# Patient Record
Sex: Male | Born: 1955
Health system: Southern US, Community
[De-identification: ages and names within clinical notes are randomized; demographics above are authoritative.]

## PROBLEM LIST (undated history)

## (undated) DIAGNOSIS — K529 Noninfective gastroenteritis and colitis, unspecified: Secondary | ICD-10-CM

## (undated) DIAGNOSIS — M4850XA Collapsed vertebra, not elsewhere classified, site unspecified, initial encounter for fracture: Secondary | ICD-10-CM

## (undated) DIAGNOSIS — I639 Cerebral infarction, unspecified: Secondary | ICD-10-CM

## (undated) DIAGNOSIS — M1990 Unspecified osteoarthritis, unspecified site: Secondary | ICD-10-CM

## (undated) DIAGNOSIS — R011 Cardiac murmur, unspecified: Secondary | ICD-10-CM

## (undated) DIAGNOSIS — Z9289 Personal history of other medical treatment: Secondary | ICD-10-CM

## (undated) DIAGNOSIS — M51369 Other intervertebral disc degeneration, lumbar region without mention of lumbar back pain or lower extremity pain: Secondary | ICD-10-CM

## (undated) DIAGNOSIS — K4090 Unilateral inguinal hernia, without obstruction or gangrene, not specified as recurrent: Secondary | ICD-10-CM

## (undated) DIAGNOSIS — I10 Essential (primary) hypertension: Secondary | ICD-10-CM

## (undated) DIAGNOSIS — F1010 Alcohol abuse, uncomplicated: Secondary | ICD-10-CM

## (undated) DIAGNOSIS — J449 Chronic obstructive pulmonary disease, unspecified: Secondary | ICD-10-CM

## (undated) DIAGNOSIS — M5136 Other intervertebral disc degeneration, lumbar region: Secondary | ICD-10-CM

## (undated) DIAGNOSIS — K219 Gastro-esophageal reflux disease without esophagitis: Secondary | ICD-10-CM

## (undated) DIAGNOSIS — Z720 Tobacco use: Secondary | ICD-10-CM

## (undated) HISTORY — PX: COLON SURGERY: SHX602

---

## 1979-03-09 HISTORY — PX: EXPLORATORY LAPAROTOMY W/ BOWEL RESECTION: SHX1544

## 1999-06-23 ENCOUNTER — Emergency Department (HOSPITAL_COMMUNITY): Admission: EM | Admit: 1999-06-23 | Discharge: 1999-06-23 | Payer: Self-pay | Admitting: Emergency Medicine

## 1999-06-23 ENCOUNTER — Encounter: Payer: Self-pay | Admitting: *Deleted

## 1999-07-09 DIAGNOSIS — Z9289 Personal history of other medical treatment: Secondary | ICD-10-CM

## 1999-07-09 HISTORY — DX: Personal history of other medical treatment: Z92.89

## 1999-11-29 ENCOUNTER — Encounter: Admission: RE | Admit: 1999-11-29 | Discharge: 1999-11-30 | Payer: Self-pay | Admitting: *Deleted

## 2000-02-13 ENCOUNTER — Inpatient Hospital Stay (HOSPITAL_COMMUNITY): Admission: EM | Admit: 2000-02-13 | Discharge: 2000-02-14 | Payer: Self-pay | Admitting: *Deleted

## 2000-02-13 ENCOUNTER — Encounter: Payer: Self-pay | Admitting: *Deleted

## 2000-03-10 ENCOUNTER — Emergency Department (HOSPITAL_COMMUNITY): Admission: EM | Admit: 2000-03-10 | Discharge: 2000-03-10 | Payer: Self-pay | Admitting: Emergency Medicine

## 2002-01-27 ENCOUNTER — Encounter: Admission: RE | Admit: 2002-01-27 | Discharge: 2002-01-27 | Payer: Self-pay | Admitting: Occupational Medicine

## 2002-01-27 ENCOUNTER — Encounter: Payer: Self-pay | Admitting: Occupational Medicine

## 2002-02-03 ENCOUNTER — Emergency Department (HOSPITAL_COMMUNITY): Admission: EM | Admit: 2002-02-03 | Discharge: 2002-02-03 | Payer: Self-pay | Admitting: Emergency Medicine

## 2002-02-03 ENCOUNTER — Encounter: Payer: Self-pay | Admitting: Emergency Medicine

## 2002-02-04 ENCOUNTER — Encounter: Payer: Self-pay | Admitting: Emergency Medicine

## 2002-02-14 ENCOUNTER — Encounter: Payer: Self-pay | Admitting: Emergency Medicine

## 2002-02-14 ENCOUNTER — Emergency Department (HOSPITAL_COMMUNITY): Admission: EM | Admit: 2002-02-14 | Discharge: 2002-02-14 | Payer: Self-pay | Admitting: Emergency Medicine

## 2004-03-01 ENCOUNTER — Emergency Department (HOSPITAL_COMMUNITY): Admission: EM | Admit: 2004-03-01 | Discharge: 2004-03-01 | Payer: Self-pay | Admitting: Emergency Medicine

## 2004-05-20 ENCOUNTER — Emergency Department: Payer: Self-pay | Admitting: Emergency Medicine

## 2005-02-25 ENCOUNTER — Emergency Department (HOSPITAL_COMMUNITY): Admission: EM | Admit: 2005-02-25 | Discharge: 2005-02-25 | Payer: Self-pay | Admitting: Emergency Medicine

## 2005-03-06 ENCOUNTER — Emergency Department (HOSPITAL_COMMUNITY): Admission: EM | Admit: 2005-03-06 | Discharge: 2005-03-06 | Payer: Self-pay | Admitting: Emergency Medicine

## 2005-03-10 ENCOUNTER — Emergency Department: Payer: Self-pay | Admitting: Internal Medicine

## 2005-04-02 ENCOUNTER — Emergency Department: Payer: Self-pay | Admitting: Emergency Medicine

## 2005-08-04 ENCOUNTER — Emergency Department (HOSPITAL_COMMUNITY): Admission: EM | Admit: 2005-08-04 | Discharge: 2005-08-04 | Payer: Self-pay | Admitting: Emergency Medicine

## 2005-09-12 ENCOUNTER — Emergency Department (HOSPITAL_COMMUNITY): Admission: EM | Admit: 2005-09-12 | Discharge: 2005-09-12 | Payer: Self-pay | Admitting: Emergency Medicine

## 2005-10-16 ENCOUNTER — Inpatient Hospital Stay (HOSPITAL_COMMUNITY): Admission: EM | Admit: 2005-10-16 | Discharge: 2005-10-17 | Payer: Self-pay | Admitting: Emergency Medicine

## 2005-10-17 ENCOUNTER — Encounter (INDEPENDENT_AMBULATORY_CARE_PROVIDER_SITE_OTHER): Payer: Self-pay | Admitting: Specialist

## 2006-03-03 ENCOUNTER — Emergency Department: Payer: Self-pay | Admitting: Unknown Physician Specialty

## 2006-06-07 ENCOUNTER — Emergency Department (HOSPITAL_COMMUNITY): Admission: EM | Admit: 2006-06-07 | Discharge: 2006-06-07 | Payer: Self-pay | Admitting: Emergency Medicine

## 2006-06-28 ENCOUNTER — Emergency Department (HOSPITAL_COMMUNITY): Admission: EM | Admit: 2006-06-28 | Discharge: 2006-06-28 | Payer: Self-pay | Admitting: Emergency Medicine

## 2006-07-26 ENCOUNTER — Emergency Department (HOSPITAL_COMMUNITY): Admission: EM | Admit: 2006-07-26 | Discharge: 2006-07-26 | Payer: Self-pay | Admitting: Emergency Medicine

## 2006-10-16 ENCOUNTER — Emergency Department (HOSPITAL_COMMUNITY): Admission: EM | Admit: 2006-10-16 | Discharge: 2006-10-16 | Payer: Self-pay | Admitting: Emergency Medicine

## 2006-11-13 ENCOUNTER — Emergency Department (HOSPITAL_COMMUNITY): Admission: EM | Admit: 2006-11-13 | Discharge: 2006-11-13 | Payer: Self-pay | Admitting: Emergency Medicine

## 2007-01-07 ENCOUNTER — Encounter: Admission: RE | Admit: 2007-01-07 | Discharge: 2007-01-07 | Payer: Self-pay | Admitting: Internal Medicine

## 2007-01-12 ENCOUNTER — Encounter: Admission: RE | Admit: 2007-01-12 | Discharge: 2007-04-12 | Payer: Self-pay | Admitting: Internal Medicine

## 2007-01-30 ENCOUNTER — Encounter: Admission: RE | Admit: 2007-01-30 | Discharge: 2007-01-30 | Payer: Self-pay | Admitting: Internal Medicine

## 2007-05-27 ENCOUNTER — Emergency Department (HOSPITAL_COMMUNITY): Admission: EM | Admit: 2007-05-27 | Discharge: 2007-05-27 | Payer: Self-pay | Admitting: Emergency Medicine

## 2007-07-17 ENCOUNTER — Emergency Department (HOSPITAL_COMMUNITY): Admission: EM | Admit: 2007-07-17 | Discharge: 2007-07-17 | Payer: Self-pay | Admitting: Emergency Medicine

## 2007-08-27 ENCOUNTER — Emergency Department: Payer: Self-pay | Admitting: Emergency Medicine

## 2007-08-27 ENCOUNTER — Other Ambulatory Visit: Payer: Self-pay

## 2008-06-21 ENCOUNTER — Emergency Department (HOSPITAL_COMMUNITY): Admission: EM | Admit: 2008-06-21 | Discharge: 2008-06-21 | Payer: Self-pay | Admitting: Emergency Medicine

## 2008-12-07 ENCOUNTER — Emergency Department (HOSPITAL_COMMUNITY): Admission: EM | Admit: 2008-12-07 | Discharge: 2008-12-07 | Payer: Self-pay | Admitting: Emergency Medicine

## 2009-01-16 ENCOUNTER — Emergency Department (HOSPITAL_COMMUNITY): Admission: EM | Admit: 2009-01-16 | Discharge: 2009-01-16 | Payer: Self-pay | Admitting: Emergency Medicine

## 2009-03-19 ENCOUNTER — Emergency Department (HOSPITAL_COMMUNITY): Admission: EM | Admit: 2009-03-19 | Discharge: 2009-03-19 | Payer: Self-pay | Admitting: Emergency Medicine

## 2009-04-15 ENCOUNTER — Emergency Department (HOSPITAL_COMMUNITY): Admission: EM | Admit: 2009-04-15 | Discharge: 2009-04-15 | Payer: Self-pay | Admitting: Emergency Medicine

## 2009-04-16 ENCOUNTER — Emergency Department (HOSPITAL_COMMUNITY): Admission: EM | Admit: 2009-04-16 | Discharge: 2009-04-17 | Payer: Self-pay | Admitting: Emergency Medicine

## 2009-06-03 ENCOUNTER — Emergency Department (HOSPITAL_COMMUNITY): Admission: EM | Admit: 2009-06-03 | Discharge: 2009-06-03 | Payer: Self-pay | Admitting: Emergency Medicine

## 2009-07-13 ENCOUNTER — Emergency Department (HOSPITAL_COMMUNITY): Admission: EM | Admit: 2009-07-13 | Discharge: 2009-07-14 | Payer: Self-pay | Admitting: Emergency Medicine

## 2009-09-27 ENCOUNTER — Emergency Department (HOSPITAL_COMMUNITY): Admission: EM | Admit: 2009-09-27 | Discharge: 2009-09-27 | Payer: Self-pay | Admitting: Emergency Medicine

## 2009-10-18 ENCOUNTER — Emergency Department (HOSPITAL_COMMUNITY): Admission: EM | Admit: 2009-10-18 | Discharge: 2009-10-18 | Payer: Self-pay | Admitting: Emergency Medicine

## 2010-02-27 ENCOUNTER — Emergency Department (HOSPITAL_COMMUNITY): Admission: EM | Admit: 2010-02-27 | Discharge: 2010-02-27 | Payer: Self-pay | Admitting: Emergency Medicine

## 2010-04-06 ENCOUNTER — Emergency Department (HOSPITAL_COMMUNITY): Admission: EM | Admit: 2010-04-06 | Discharge: 2010-04-06 | Payer: Self-pay | Admitting: Emergency Medicine

## 2010-05-18 ENCOUNTER — Emergency Department (HOSPITAL_COMMUNITY): Admission: EM | Admit: 2010-05-18 | Discharge: 2010-05-18 | Payer: Self-pay | Admitting: Emergency Medicine

## 2010-07-24 ENCOUNTER — Emergency Department (HOSPITAL_COMMUNITY)
Admission: EM | Admit: 2010-07-24 | Discharge: 2010-07-24 | Payer: Self-pay | Source: Home / Self Care | Admitting: Emergency Medicine

## 2010-09-26 LAB — BASIC METABOLIC PANEL
BUN: 7 mg/dL (ref 6–23)
CO2: 26 mEq/L (ref 19–32)
Calcium: 8.5 mg/dL (ref 8.4–10.5)
Chloride: 106 mEq/L (ref 96–112)
Creatinine, Ser: 1.01 mg/dL (ref 0.4–1.5)

## 2010-09-26 LAB — CBC
Hemoglobin: 16 g/dL (ref 13.0–17.0)
MCHC: 34.2 g/dL (ref 30.0–36.0)
RDW: 16.6 % — ABNORMAL HIGH (ref 11.5–15.5)
WBC: 4.7 10*3/uL (ref 4.0–10.5)

## 2010-09-26 LAB — DIFFERENTIAL
Basophils Absolute: 0 10*3/uL (ref 0.0–0.1)
Basophils Relative: 1 % (ref 0–1)
Eosinophils Absolute: 0.1 10*3/uL (ref 0.0–0.7)
Lymphocytes Relative: 39 % (ref 12–46)
Neutro Abs: 2.3 10*3/uL (ref 1.7–7.7)

## 2010-09-30 LAB — COMPREHENSIVE METABOLIC PANEL
ALT: 57 U/L — ABNORMAL HIGH (ref 0–53)
Alkaline Phosphatase: 50 U/L (ref 39–117)
CO2: 25 mEq/L (ref 19–32)
Chloride: 104 mEq/L (ref 96–112)
Glucose, Bld: 91 mg/dL (ref 70–99)
Potassium: 3.8 mEq/L (ref 3.5–5.1)
Sodium: 136 mEq/L (ref 135–145)
Total Bilirubin: 0.5 mg/dL (ref 0.3–1.2)
Total Protein: 7.2 g/dL (ref 6.0–8.3)

## 2010-09-30 LAB — CBC
HCT: 46.7 % (ref 39.0–52.0)
Hemoglobin: 15.3 g/dL (ref 13.0–17.0)
RBC: 5.35 MIL/uL (ref 4.22–5.81)
RDW: 16.3 % — ABNORMAL HIGH (ref 11.5–15.5)
WBC: 5 10*3/uL (ref 4.0–10.5)

## 2010-09-30 LAB — DIFFERENTIAL
Basophils Absolute: 0 10*3/uL (ref 0.0–0.1)
Basophils Relative: 1 % (ref 0–1)
Eosinophils Absolute: 0.4 10*3/uL (ref 0.0–0.7)
Monocytes Relative: 8 % (ref 3–12)
Neutrophils Relative %: 49 % (ref 43–77)

## 2010-09-30 LAB — URINALYSIS, ROUTINE W REFLEX MICROSCOPIC
Bilirubin Urine: NEGATIVE
Hgb urine dipstick: NEGATIVE
Nitrite: NEGATIVE
Protein, ur: NEGATIVE mg/dL

## 2010-09-30 LAB — URINE CULTURE

## 2010-09-30 LAB — URINE MICROSCOPIC-ADD ON

## 2010-10-01 ENCOUNTER — Emergency Department (HOSPITAL_COMMUNITY): Payer: Self-pay

## 2010-10-01 ENCOUNTER — Emergency Department (HOSPITAL_COMMUNITY)
Admission: EM | Admit: 2010-10-01 | Discharge: 2010-10-01 | Disposition: A | Discharge status: Home / Self Care | Payer: Self-pay | Attending: Emergency Medicine | Admitting: Emergency Medicine

## 2010-10-01 DIAGNOSIS — R109 Unspecified abdominal pain: Secondary | ICD-10-CM | POA: Insufficient documentation

## 2010-10-01 DIAGNOSIS — Z8719 Personal history of other diseases of the digestive system: Secondary | ICD-10-CM | POA: Insufficient documentation

## 2010-10-01 LAB — COMPREHENSIVE METABOLIC PANEL
ALT: 48 U/L (ref 0–53)
AST: 56 U/L — ABNORMAL HIGH (ref 0–37)
Alkaline Phosphatase: 54 U/L (ref 39–117)
CO2: 23 mEq/L (ref 19–32)
Chloride: 105 mEq/L (ref 96–112)
GFR calc Af Amer: >60 mL/min (ref >60)
GFR calc non Af Amer: >60 mL/min (ref >60)
Sodium: 136 mEq/L (ref 135–145)
Total Bilirubin: 1.4 mg/dL — ABNORMAL HIGH (ref 0.3–1.2)

## 2010-10-01 LAB — DIFFERENTIAL
Eosinophils Relative: 41 % — ABNORMAL HIGH (ref 0–5)
Lymphs Abs: 1.8 10*3/uL (ref 0.7–4.0)
Monocytes Relative: 6 % (ref 3–12)
Neutrophils Relative %: 38 % — ABNORMAL LOW (ref 43–77)

## 2010-10-01 LAB — URINALYSIS, ROUTINE W REFLEX MICROSCOPIC
Bilirubin Urine: NEGATIVE
Hgb urine dipstick: NEGATIVE
Protein, ur: NEGATIVE mg/dL
Specific Gravity, Urine: 1.03 (ref 1.005–1.030)
Urobilinogen, UA: 0.2 mg/dL (ref 0.0–1.0)

## 2010-10-01 LAB — CBC
Hemoglobin: 16 g/dL (ref 13.0–17.0)
RBC: 5.55 MIL/uL (ref 4.22–5.81)

## 2010-10-01 LAB — POTASSIUM: Potassium: 4.3 mEq/L (ref 3.5–5.1)

## 2010-10-10 LAB — CBC
HCT: 47.9 % (ref 39.0–52.0)
Hemoglobin: 16.2 g/dL (ref 13.0–17.0)
RDW: 16.1 % — ABNORMAL HIGH (ref 11.5–15.5)

## 2010-10-10 LAB — POCT I-STAT, CHEM 8
BUN: 11 mg/dL (ref 6–23)
Chloride: 105 mEq/L (ref 96–112)
Creatinine, Ser: 1.2 mg/dL (ref 0.4–1.5)
Glucose, Bld: 117 mg/dL — ABNORMAL HIGH (ref 70–99)
Hemoglobin: 18 g/dL — ABNORMAL HIGH (ref 13.0–17.0)
Potassium: 3.9 mEq/L (ref 3.5–5.1)

## 2010-10-10 LAB — DIFFERENTIAL
Basophils Relative: 0 % (ref 0–1)
Eosinophils Relative: 2 % (ref 0–5)
Monocytes Absolute: 1.1 10*3/uL — ABNORMAL HIGH (ref 0.1–1.0)
Neutrophils Relative %: 59 % (ref 43–77)

## 2010-10-11 LAB — DIFFERENTIAL
Basophils Absolute: 0.1 10*3/uL (ref 0.0–0.1)
Basophils Absolute: 0.1 10*3/uL (ref 0.0–0.1)
Eosinophils Absolute: 1.7 10*3/uL — ABNORMAL HIGH (ref 0.0–0.7)
Eosinophils Relative: 1 % (ref 0–5)
Eosinophils Relative: 22 % — ABNORMAL HIGH (ref 0–5)
Lymphocytes Relative: 13 % (ref 12–46)
Monocytes Absolute: 0.1 10*3/uL (ref 0.1–1.0)
Monocytes Absolute: 0.3 10*3/uL (ref 0.1–1.0)
Monocytes Relative: 1 % — ABNORMAL LOW (ref 3–12)
Neutro Abs: 6 10*3/uL (ref 1.7–7.7)

## 2010-10-11 LAB — BASIC METABOLIC PANEL
BUN: 11 mg/dL (ref 6–23)
CO2: 22 mEq/L (ref 19–32)
Chloride: 108 mEq/L (ref 96–112)
Glucose, Bld: 77 mg/dL (ref 70–99)
Potassium: 3.5 mEq/L (ref 3.5–5.1)

## 2010-10-11 LAB — COMPREHENSIVE METABOLIC PANEL
AST: 31 U/L (ref 0–37)
Albumin: 3.9 g/dL (ref 3.5–5.2)
Chloride: 109 mEq/L (ref 96–112)
Creatinine, Ser: 0.88 mg/dL (ref 0.4–1.5)
GFR calc Af Amer: >60 mL/min (ref >60)
Potassium: 3.9 mEq/L (ref 3.5–5.1)
Total Bilirubin: 0.5 mg/dL (ref 0.3–1.2)
Total Protein: 6.8 g/dL (ref 6.0–8.3)

## 2010-10-11 LAB — CBC
HCT: 46.5 % (ref 39.0–52.0)
MCHC: 33.2 g/dL (ref 30.0–36.0)
MCV: 87.7 fL (ref 78.0–100.0)
Platelets: 161 10*3/uL (ref 150–400)
Platelets: 164 10*3/uL (ref 150–400)
RDW: 15.5 % (ref 11.5–15.5)
RDW: 15.5 % (ref 11.5–15.5)
WBC: 7.2 10*3/uL (ref 4.0–10.5)

## 2010-10-12 LAB — DIFFERENTIAL
Basophils Absolute: 0 10*3/uL (ref 0.0–0.1)
Basophils Relative: 0 % (ref 0–1)
Eosinophils Absolute: 0.7 10*3/uL (ref 0.0–0.7)
Eosinophils Relative: 11 % — ABNORMAL HIGH (ref 0–5)
Lymphocytes Relative: 20 % (ref 12–46)
Monocytes Absolute: 0.3 10*3/uL (ref 0.1–1.0)

## 2010-10-12 LAB — CBC
HCT: 43.2 % (ref 39.0–52.0)
Hemoglobin: 14.5 g/dL (ref 13.0–17.0)
MCHC: 33.6 g/dL (ref 30.0–36.0)
MCV: 86.4 fL (ref 78.0–100.0)
Platelets: 164 10*3/uL (ref 150–400)
RDW: 15.6 % — ABNORMAL HIGH (ref 11.5–15.5)

## 2010-10-12 LAB — BASIC METABOLIC PANEL
BUN: 9 mg/dL (ref 6–23)
CO2: 24 mEq/L (ref 19–32)
Chloride: 106 mEq/L (ref 96–112)
GFR calc non Af Amer: >60 mL/min (ref >60)
Glucose, Bld: 96 mg/dL (ref 70–99)
Potassium: 3.6 mEq/L (ref 3.5–5.1)
Sodium: 135 mEq/L (ref 135–145)

## 2010-10-15 LAB — POCT CARDIAC MARKERS
CKMB, poc: <1 ng/mL — ABNORMAL LOW (ref 1.0–8.0)
Troponin i, poc: <0.05 ng/mL (ref 0.00–0.09)

## 2010-10-15 LAB — BASIC METABOLIC PANEL
BUN: 4 mg/dL — ABNORMAL LOW (ref 6–23)
CO2: 20 mEq/L (ref 19–32)
Chloride: 109 mEq/L (ref 96–112)
Creatinine, Ser: 0.87 mg/dL (ref 0.4–1.5)

## 2010-10-15 LAB — CBC
HCT: 46.6 % (ref 39.0–52.0)
MCHC: 33 g/dL (ref 30.0–36.0)
MCV: 86.6 fL (ref 78.0–100.0)
Platelets: 180 10*3/uL (ref 150–400)

## 2010-10-15 LAB — DIFFERENTIAL
Basophils Relative: 0 % (ref 0–1)
Eosinophils Absolute: 0.2 10*3/uL (ref 0.0–0.7)
Eosinophils Relative: 3 % (ref 0–5)
Monocytes Relative: 6 % (ref 3–12)
Neutrophils Relative %: 59 % (ref 43–77)

## 2010-10-15 LAB — D-DIMER, QUANTITATIVE: D-Dimer, Quant: <0.22 ug/mL-FEU (ref 0.00–0.48)

## 2010-11-23 NOTE — Discharge Summary (Signed)
NAMELINCOLN, GINLEY               ACCOUNT NO.:  [id number]   MEDICAL RECORD NO.:  [id number]          PATIENT TYPE:  INP   LOCATION:  5708                         FACILITY:  [facility]   PHYSICIAN:  Sharlet Salina T. Hoxworth, M.D.DATE OF BIRTH:  [birth year]   DATE OF ADMISSION:  10/16/2005  DATE OF DISCHARGE:  10/17/2005                                 DISCHARGE SUMMARY   CONSULTATIONS:  Dr. Ewing Schlein with gastroenterology.   CHIEF COMPLAINT:  Mr. Larmon is a 55 year old male patient five hours prior  to presentation he developed constant aching mid-abdominal pain. The patient  has a significant past medical history of laparotomy and small bowel  resection in 1987.  The pathology on the subsequent resection of the small  bowel demonstrated eosinophilic enteritis.  The patient reports that over  the past few years since this surgery he has had similar pain as today's  presentation and this has been treated as eosinophilic enteritis.  The  treatment regimen has included steroids.  He reports that today's episode is  somewhat more severe but otherwise similar to prior episodes.  He denied any  nausea or vomiting.  Bowel movements have been normal.  No fevers or chills.  In the ER, his vital signs were stable.  He was afebrile.   A CT of the abdomen and the pelvis in the ER was reviewed with the  radiologist by Dr. Johna Sheriff and it showed a 15-20 cm segment of proximal  small bowel with marked bowel wall thickening, some free fluid in the left  upper quadrant and in the pelvis and a single loop of moderately dilated  small bowel proximally.  The patient was admitted with a diagnosis of acute  abdominal pain with thickened small bowel probably related to eosinophilic  enteritis due to his past history.   HOSPITAL COURSE:  The patient was admitted to the floor where he was started  on IV fluid hydration.  GI consult was obtained.  Subsequently, the patient  underwent an EGD.  This revealed a normal  stomach.  No significant findings.  Within the first 24 hours, the patient's white count remained stable.  Potassium 3.6, creatinine 1.0.  It was felt that the patient had  eosinophilic enteritis and the patient was sent home on a prednisone taper  with follow-up plan to see Dr. Ewing Schlein in two to three weeks.   FINAL DIAGNOSIS:  Abdominal pain secondary eosinophilic enteritis.   DISCHARGE MEDICATIONS:  1.  Prednisone 40 mg daily.  2.  Prilosec while using the prednisone.   DIET:  No restrictions.   ACTIVITY:  No restrictions.   FOLLOW UP:  Follow up with Dr. Ewing Schlein.  He needs to call to be seen in two to  three weeks.      Allison L. Rennis Harding, N.P.      Lorne Skeens. Hoxworth, M.D.  Electronically Signed    ALE/MEDQ  D:  11/20/2005  T:  11/21/2005  Job:  [id number]   cc:   Petra Kuba, M.D.  Fax: [phone]

## 2010-11-23 NOTE — Discharge Summary (Signed)
[place]  Patient:    [name], [name]                      MRN: [id number] Adm. Date:  [id number] Disc. Date: [id number] Attending:  Junious Silk Dictator:   Joellyn Rued, P.A.C. CC:         Dr. Hetty Ely   Discharge Summary  DATE OF BIRTH:  June 12, 1956.  HISTORY OF PRESENT ILLNESS:  Mr. Down is a 55 year old black male with a history of hypertension, eosinophilic gastroenteritis, and tobacco use.  He presents to [facility] emergency room for evaluation of chest discomfort.  He is a International aid/development worker for [employer] and while driving, he developed right-sided chest discomfort which he described as a crushing sensation that radiated to his right arm and right neck associated with nausea, diaphoresis, and shortness of breath.  He pulled over to the side of the road and the pain abated within five minutes and he went home.  The discomfort returned, thus, he called EMS and was transported.  He also noted some blurry vision and presyncope with the discomfort.  He denies any exertional symptoms.  LABORATORY DATA:  Admission H&H was 14.6 and 42.8, normal indices.  Platelets 179, WBC 5.7.  PT 12.6, PTT 23, D-dimer was 0.21.  Sodium 136, potassium 3.4, BUN 12, creatinine 1.1, glucose 87.  SGOT was slightly elevated at 38.  CKs and troponins were negative for myocardial infarction.  Amylase 74, lipase 20, fasting cholesterol 252, triglycerides 97, HDL 78, LDL 155 with a ratio of 3.2.  EKG showed normal sinus rhythm, left axis deviation, left anterior hemiblock.  HOSPITAL COURSE:  [name] was admitted to 6500 for observation.  Overnight he did not have any further complaints.  It was noted that his lipids were elevated and he was started on Lipitor by Maisie Fus C. Wall, M.D. [facility]  An exercise stress test was performed on August 9, utilizing the Bruce protocol. He exercised a total of seven minutes at 11-2 and it was  discontinued secondary to fatigue and elevated heart rate of 290.  He was asymptomatic. There were no EKG changes.  Imaging showed an EF of 58% and no ischemia or scar.  With his negative stress test, it was felt that he could be discharged home to be follow up with Dr. Hetty Ely.  He will need a lipid panel in approximately six weeks and a recheck of his LFTs.  DISCHARGE MEDICATIONS: 1. Prevacid 30 mg q.d. 2. Prednisone 10 mg p.r.n. 3. Norvasc 5 mg q.d. 4. Lipitor 10 mg q.h.s.  DIET:  Low salt, fat, and cholesterol diet.  No smoking tobacco products.  Less than 2 ounces of alcohol a day.  He was asked to follow up with Dr. Hetty Ely for a lipid panel and LFT check. Consider GI evaluation if the discomfort continues.  DIAGNOSIS:  Noncardiac chest discomfort. DD:  02/14/00 TD:  02/14/00 Job: [id number]

## 2010-11-23 NOTE — Op Note (Signed)
NAMEKEAGON, Curtis               ACCOUNT NO.:  [id number]   MEDICAL RECORD NO.:  [id number]          PATIENT TYPE:  INP   LOCATION:  5708                         FACILITY:  [facility]   PHYSICIAN:  Petra Kuba, M.D.    DATE OF BIRTH:  Jan 31, 1956   DATE OF PROCEDURE:  10/17/2005  DATE OF DISCHARGE:                                 OPERATIVE REPORT   PROCEDURE:  Esophagogastroduodenoscopy using the pediatric colonoscope.   INDICATIONS:  Abnormal CT in a patient with eosinophilic enteritis.  Want to  evaluate the jejunal loops.  Consent was signed after risks, benefits,  methods, options thoroughly discussed yesterday and today before any  premedications given.   MEDICATIONS USED:  Fentanyl 100 mcg, Versed 10 mg.   PROCEDURE:  The pediatric video colonoscope was inserted by direct vision  through his esophagus.  He did have a small to medium-sized hiatal hernia.  He did not hold air throughout the procedure.  The scope passed through a  normal antrum, normal pylorus into a normal duodenal bulb and around a  normal duodenum, past the ligament of Treitz until we reached the surgical  anastomosis, which did have one blind limb.  There was a dilated segment but  no obvious erythema, ulceration or signs of inflammation.  We could advance  a short way past this area; however, the patient did not hold air well and  did not like [place] pushing.  We elected to take a few biopsies of the jejunum  and a few of the duodenum on withdrawal but not try to push any further.  On  slow withdrawal back to the bulb, no abnormalities were seen.  Once back in  the stomach, it was quickly evaluated on straight and retroflex  visualization.  No abnormalities were seen but with his inability hold air,  complete evaluation was not possible but we did the best we could.  A few  biopsies of the antrum and a few of the proximal stomach were obtained and  put in a second container.  The scope was slowly withdrawn, again  confirming  the small to medium-size hiatal hernia.  The rest of the esophagus was  normal.  Scope was removed.  The patient tolerated the procedure adequately.  There was no obvious immediate complications.   ENDOSCOPIC DIAGNOSES:  1.  Small medium-size hiatal hernia.  2.  Normal stomach, status post biopsy, but did not hold air well so      complete evaluation difficult.  3.  Endoscopy to the jejunal anastomosis and a little past, status post      biopsy, slightly dilated segment.  4.  Otherwise within normal limits EGD without significant findings.   PLAN:  1.  Advance diet.  2.  Await pathology.  3.  Go ahead and put him on prednisone and wean it in the customary fashion.  4.  Hopefully home later today or tomorrow if no delayed complications and      follow up with me in two to four weeks to discuss 6-MP, Imuran or      another immune modulator.  Happy to see back sooner p.r.n.  5.  Continue pump inhibitors.           ______________________________  Petra Kuba, M.D.     MEM/MEDQ  D:  10/17/2005  T:  10/17/2005  Job:  [id number]   cc:   Lorne Skeens. Hoxworth, M.D.  [address]  [place]  [place] [address]

## 2010-11-23 NOTE — Consult Note (Signed)
[name], [name]               ACCOUNT NO.:  [id number]   MEDICAL RECORD NO.:  [id number]          PATIENT TYPE:  INP   LOCATION:  5708                         FACILITY:  [facility]   PHYSICIAN:  Petra Kuba, M.D.    DATE OF BIRTH:  12/16/1955   DATE OF CONSULTATION:  10/16/2005  DATE OF DISCHARGE:                                   CONSULTATION   HISTORY:  The patient is seen at the request of Dr. Johna Curtis with a one day  history of significant abdominal pain, no nausea or vomiting, is actually  hungry, and has not had any lower problems.  He has a long history of  eosinophilic enteritis, gets on prednisone roughly every other month,  sometimes takes it for two weeks, sometimes a month, and tends to get  better. Since he had a small bowel biopsy by Dr. Daphine Curtis which confirmed the  diagnosis, he has had no other GI tests.  He is totally asymptomatic in  between these bouts.  The pain seems to be a little different although in  the same spot.  He cannot really explain how it is different, probably the  pain is more significant with less crampiness.  He has not had any fever,  chills, rash, or other complaints.   PAST MEDICAL HISTORY:  Essentially negative except above.   FAMILY HISTORY:  Negative for any obvious GI problems.   SOCIAL HISTORY:  He does smoke and drink, minimizes much other over-the-  counter medicines except for over-the-counter Prilosec which he takes with  his prednisone which gives him a little more reflux.   ALLERGIES:  PENICILLIN.   REVIEW OF SYSTEMS:  Negative except as above.   PHYSICAL EXAMINATION:  No acute distress.  Vital signs stable, afebrile.  Exam pertinent for his abdomen being slightly tender throughout.  No  guarding or rebound.  Positive bowel sounds, soft.   LABORATORY DATA:  Labs reviewed and pertinent for 11% eosinophils, other  labs OK.  CT pertinent for edematous loop of jejunum.   ASSESSMENT:  1.  Eosinophilic enteritis.  2.  Abnormal  CAT scan.   PLAN:  Clear liquids okay, will give him a mid dose of 40 q.12h. Solu-  Medrol, usually steroids help him fairly rapidly. I discussed with the  patient an endoscopy using the pediatric colonoscope versus a small bowel  series.  We will reevaluate him in the morning but probably proceed with the  endoscopy and the patient agrees. However, if something changes, could do x-  rays if we think he is getting worse.  We will follow with you.           ______________________________  Petra Kuba, M.D.     MEM/MEDQ  D:  10/16/2005  T:  10/16/2005  Job:  [id number]   cc:   Lorne Skeens. Hoxworth, M.D.  [address]  [place]  [place] [address]   Laurita Quint, M.D.  Fax: [phone]

## 2010-11-23 NOTE — H&P (Signed)
NAMENATHANYAL, ASHMEAD               ACCOUNT NO.:  [id number]   MEDICAL RECORD NO.:  [id number]          PATIENT TYPE:  EMS   LOCATION:  MAJO                         FACILITY:  [facility]   PHYSICIAN:  Sharlet Salina T. Hoxworth, M.D.DATE OF BIRTH:  [birth year]   DATE OF ADMISSION:  10/16/2005  DATE OF DISCHARGE:                                HISTORY & PHYSICAL   CHIEF COMPLAINT:  Abdominal pain.   HISTORY OF PRESENT ILLNESS:  [name] is a very pleasant 55 year old  black male.  He awoke this morning, now about five hours ago, with the acute  onset of constant aching midabdominal pain.  The patient has a significant  past medical history of a laparotomy and small bowel resection in 1987 by  Dr. Luretha Murphy for recurrent episodes of abdominal pain with thickened  small bowel, and he underwent resection of about two feet of small bowel  showing eosinophilic enteritis.  The patient states that over the ensuing  years he has had episodes of similar abdominal pain treated as eosinophilic  enteritis with steroids per the patient with resolution.  This occurs once  or twice a year.  This episode is somewhat more severe but otherwise similar  to previous episodes.  He denies any nausea or vomiting.  Bowel movements  have been normal.  No fever or chills.  He apparently is not followed  regularly for this condition.   PAST MEDICAL HISTORY:  Unremarkable except for small bowel resection for  eosinophilic enteritis as described above.  Denies other illnesses,  hospitalizations or surgery.   MEDICATIONS:  None.   ALLERGIES:  PENICILLIN.   SOCIAL HISTORY:  Is separated.  He smokes a pack of cigarettes a day, drinks  alcohol occasionally.   FAMILY HISTORY:  Noncontributory.   REVIEW OF SYSTEMS:  GENERAL:  No fever, chills, weight loss.  HEENT:  No  vision, hearing or swallowing problems.  RESPIRATORY:  No shortness of  breath, cough, wheezing.  CARDIAC:  No chest pain, palpitations,  swelling.  GASTROINTESTINAL:  As above.  GENITOURINARY:  No urinary burning, frequency.  MUSCULOSKELETAL:  No joint pain.  HEMATOLOGIC:  No history of blood clots or  abnormal bleeding.   PHYSICAL EXAMINATION:  VITAL SIGNS:  Temperature is 98.3, pulse 92,  respirations 20, blood pressure 139/90.  GENERAL:  This is a well-developed black male who appears uncomfortable but  not in severe distress.  SKIN:  Warm and dry.  No rash or infection.  HEENT:  No palpable mass or thyromegaly.  Sclerae are nonicteric.  Nares and  oropharynx clear.  LYMPHATIC:  No cervical, supraclavicular or inguinal nodes palpable.  LUNGS:  Clear without wheezing or increased work of breathing.  CARDIAC:  Regular rate and rhythm with no murmurs.  No edema.  ABDOMEN:  A well-healed midline incision.  No hernias.  Nondistended.  Bowel  sounds are present but hypoactive.  There is mild to moderate diffuse  tenderness but no guarding, evidence of peritonitis or palpable masses.  EXTREMITIES:  No joint swelling or deformity.  NEUROLOGIC:  Alert, oriented.  Motor and sensory exams grossly  normal.   LABORATORY AND X-RAY:  White count is 8.8 thousand, hemoglobin 15.2,  platelets 207.  There is relative eosinophilia at 11%.  Electrolytes, LFTs,  lipase normal.  Urinalysis negative.   A CT scan of the abdomen and pelvis was obtained in the emergency room,  which I reviewed with the radiologist.  This shows an approximately 15-20 cm  segment of proximal small bowel with marked bowel wall thickening.  There is  some free fluid in the left upper quadrant and in the pelvis.  There is a  single loop of moderately-dilated small bowel proximal to this.   ASSESSMENT AND PLAN:  Acute abdominal pain with markedly thickened segment  of small bowel.  The patient has a documented history of eosinophilic  enteritis with recurrent similar episodes, and this would seem consistent  with that diagnosis.  Clinically I cannot rule out an  internal hernia or  obstruction with ischemia secondary to his previous surgery, but I think  this is much less likely.  The patient will be admitted and treated  symptomatically, and I will obtain a GI consult for possible treatment with  steroids or other medical management.  Will follow him closely.      Lorne Skeens. Hoxworth, M.D.  Electronically Signed     BTH/MEDQ  D:  10/16/2005  T:  10/16/2005  Job:  [id number]

## 2010-11-27 ENCOUNTER — Emergency Department (HOSPITAL_COMMUNITY)
Admission: EM | Admit: 2010-11-27 | Discharge: 2010-11-27 | Disposition: A | Discharge status: Home / Self Care | Payer: Self-pay | Attending: Emergency Medicine | Admitting: Emergency Medicine

## 2010-11-27 ENCOUNTER — Emergency Department (HOSPITAL_COMMUNITY): Payer: Self-pay

## 2010-11-27 DIAGNOSIS — R1110 Vomiting, unspecified: Secondary | ICD-10-CM | POA: Insufficient documentation

## 2010-11-27 DIAGNOSIS — K5281 Eosinophilic gastritis or gastroenteritis: Secondary | ICD-10-CM | POA: Insufficient documentation

## 2010-11-27 DIAGNOSIS — R1012 Left upper quadrant pain: Secondary | ICD-10-CM | POA: Insufficient documentation

## 2010-11-27 LAB — CBC
HCT: 49.1 % (ref 39.0–52.0)
MCHC: 34.6 g/dL (ref 30.0–36.0)
Platelets: 176 10*3/uL (ref 150–400)
RDW: 15.9 % — ABNORMAL HIGH (ref 11.5–15.5)
WBC: 9.2 10*3/uL (ref 4.0–10.5)

## 2010-11-27 LAB — DIFFERENTIAL
Basophils Absolute: 0 10*3/uL (ref 0.0–0.1)
Eosinophils Relative: 27 % — ABNORMAL HIGH (ref 0–5)
Lymphs Abs: 2.7 10*3/uL (ref 0.7–4.0)
Monocytes Absolute: 0.6 10*3/uL (ref 0.1–1.0)
Neutrophils Relative %: 38 % — ABNORMAL LOW (ref 43–77)

## 2010-11-27 LAB — COMPREHENSIVE METABOLIC PANEL
ALT: 47 U/L (ref 0–53)
AST: 40 U/L — ABNORMAL HIGH (ref 0–37)
Albumin: 3.9 g/dL (ref 3.5–5.2)
Alkaline Phosphatase: 60 U/L (ref 39–117)
Calcium: 9 mg/dL (ref 8.4–10.5)
GFR calc Af Amer: >60 mL/min (ref >60)
Glucose, Bld: 100 mg/dL — ABNORMAL HIGH (ref 70–99)
Potassium: 3.7 mEq/L (ref 3.5–5.1)
Sodium: 141 mEq/L (ref 135–145)
Total Protein: 6.9 g/dL (ref 6.0–8.3)

## 2010-11-27 LAB — LIPASE, BLOOD: Lipase: 15 U/L (ref 11–59)

## 2011-01-18 ENCOUNTER — Emergency Department (HOSPITAL_COMMUNITY)
Admission: EM | Admit: 2011-01-18 | Discharge: 2011-01-19 | Disposition: A | Discharge status: Home / Self Care | Payer: Self-pay | Attending: Emergency Medicine | Admitting: Emergency Medicine

## 2011-01-18 DIAGNOSIS — K2970 Gastritis, unspecified, without bleeding: Secondary | ICD-10-CM | POA: Insufficient documentation

## 2011-01-18 DIAGNOSIS — R109 Unspecified abdominal pain: Secondary | ICD-10-CM | POA: Insufficient documentation

## 2011-01-19 LAB — CBC
Hemoglobin: 16.2 g/dL (ref 13.0–17.0)
MCHC: 34 g/dL (ref 30.0–36.0)
RDW: 15.5 % (ref 11.5–15.5)
WBC: 11.5 10*3/uL — ABNORMAL HIGH (ref 4.0–10.5)

## 2011-01-19 LAB — COMPREHENSIVE METABOLIC PANEL
ALT: 55 U/L — ABNORMAL HIGH (ref 0–53)
Albumin: 3.8 g/dL (ref 3.5–5.2)
Alkaline Phosphatase: 64 U/L (ref 39–117)
Chloride: 104 mEq/L (ref 96–112)
Potassium: 3.8 mEq/L (ref 3.5–5.1)
Sodium: 139 mEq/L (ref 135–145)
Total Bilirubin: 0.3 mg/dL (ref 0.3–1.2)
Total Protein: 7.1 g/dL (ref 6.0–8.3)

## 2011-01-19 LAB — URINALYSIS, ROUTINE W REFLEX MICROSCOPIC
Bilirubin Urine: NEGATIVE
Glucose, UA: NEGATIVE mg/dL
Ketones, ur: NEGATIVE mg/dL
Nitrite: NEGATIVE
Specific Gravity, Urine: 1.024 (ref 1.005–1.030)
pH: 6.5 (ref 5.0–8.0)

## 2011-01-19 LAB — DIFFERENTIAL
Basophils Absolute: 0 10*3/uL (ref 0.0–0.1)
Eosinophils Relative: 33 % — ABNORMAL HIGH (ref 0–5)
Lymphs Abs: 1.6 10*3/uL (ref 0.7–4.0)
Monocytes Absolute: 0.7 10*3/uL (ref 0.1–1.0)
Monocytes Relative: 6 % (ref 3–12)
Neutrophils Relative %: 47 % (ref 43–77)

## 2011-01-19 LAB — URINE MICROSCOPIC-ADD ON

## 2011-02-20 ENCOUNTER — Emergency Department (HOSPITAL_COMMUNITY)
Admission: EM | Admit: 2011-02-20 | Discharge: 2011-02-21 | Disposition: A | Discharge status: Home / Self Care | Payer: Self-pay | Attending: Emergency Medicine | Admitting: Emergency Medicine

## 2011-02-20 ENCOUNTER — Emergency Department (HOSPITAL_COMMUNITY): Payer: Self-pay

## 2011-02-20 DIAGNOSIS — R109 Unspecified abdominal pain: Secondary | ICD-10-CM | POA: Insufficient documentation

## 2011-02-20 DIAGNOSIS — K219 Gastro-esophageal reflux disease without esophagitis: Secondary | ICD-10-CM | POA: Insufficient documentation

## 2011-02-20 DIAGNOSIS — Z8719 Personal history of other diseases of the digestive system: Secondary | ICD-10-CM | POA: Insufficient documentation

## 2011-02-20 DIAGNOSIS — R110 Nausea: Secondary | ICD-10-CM | POA: Insufficient documentation

## 2011-02-20 LAB — COMPREHENSIVE METABOLIC PANEL
Albumin: 3.9 g/dL (ref 3.5–5.2)
BUN: 9 mg/dL (ref 6–23)
Calcium: 9.1 mg/dL (ref 8.4–10.5)
Chloride: 101 mEq/L (ref 96–112)
Creatinine, Ser: 0.88 mg/dL (ref 0.50–1.35)
Total Bilirubin: 0.5 mg/dL (ref 0.3–1.2)
Total Protein: 7.2 g/dL (ref 6.0–8.3)

## 2011-02-20 LAB — DIFFERENTIAL
Eosinophils Absolute: 1.6 10*3/uL — ABNORMAL HIGH (ref 0.0–0.7)
Eosinophils Relative: 18 % — ABNORMAL HIGH (ref 0–5)
Lymphocytes Relative: 24 % (ref 12–46)
Lymphs Abs: 2.1 10*3/uL (ref 0.7–4.0)
Monocytes Absolute: 0.9 10*3/uL (ref 0.1–1.0)
Monocytes Relative: 10 % (ref 3–12)

## 2011-02-20 LAB — LIPASE, BLOOD: Lipase: 16 U/L (ref 11–59)

## 2011-02-20 LAB — CBC
HCT: 45.4 % (ref 39.0–52.0)
MCH: 29.8 pg (ref 26.0–34.0)
MCHC: 33.9 g/dL (ref 30.0–36.0)
MCV: 87.8 fL (ref 78.0–100.0)
RDW: 15.2 % (ref 11.5–15.5)

## 2011-02-21 ENCOUNTER — Encounter (HOSPITAL_COMMUNITY): Payer: Self-pay

## 2011-02-21 MED ORDER — IOHEXOL 300 MG/ML  SOLN
100.0000 mL | Freq: Once | INTRAMUSCULAR | Status: AC | PRN
  Administered 2011-02-21: 100 mL via INTRAVENOUS

## 2011-02-22 LAB — URINE CULTURE: Colony Count: NO GROWTH

## 2011-03-27 LAB — DIFFERENTIAL
Eosinophils Absolute: 2.3 — ABNORMAL HIGH
Lymphs Abs: 1.2
Monocytes Relative: 4
Neutro Abs: 5.5
Neutrophils Relative %: 59

## 2011-03-27 LAB — CBC
HCT: 43.4
Hemoglobin: 15
MCHC: 34.7
MCV: 82.3
RBC: 5.27
RDW: 15.8 — ABNORMAL HIGH

## 2011-03-27 LAB — COMPREHENSIVE METABOLIC PANEL
ALT: 14
BUN: 7
CO2: 24
Calcium: 9
Creatinine, Ser: 0.99
GFR calc non Af Amer: >60
Glucose, Bld: 93
Total Protein: 6.9

## 2011-03-27 LAB — POCT CARDIAC MARKERS
CKMB, poc: <1 — ABNORMAL LOW
Myoglobin, poc: 41.9
Operator id: 4661

## 2011-04-12 LAB — DIFFERENTIAL
Basophils Absolute: 0 10*3/uL (ref 0.0–0.1)
Basophils Relative: 0 % (ref 0–1)
Neutro Abs: 6.8 10*3/uL (ref 1.7–7.7)
Neutrophils Relative %: 72 % (ref 43–77)

## 2011-04-12 LAB — POCT I-STAT, CHEM 8
Calcium, Ion: 1.15 mmol/L (ref 1.12–1.32)
Creatinine, Ser: 1.2 mg/dL (ref 0.4–1.5)
Glucose, Bld: 93 mg/dL (ref 70–99)
Glucose, Bld: 94 mg/dL (ref 70–99)
HCT: 56 % — ABNORMAL HIGH (ref 39.0–52.0)
HCT: 56 % — ABNORMAL HIGH (ref 39.0–52.0)
Hemoglobin: 19 g/dL — ABNORMAL HIGH (ref 13.0–17.0)
Hemoglobin: 19 g/dL — ABNORMAL HIGH (ref 13.0–17.0)
Potassium: 3.8 mEq/L (ref 3.5–5.1)
Sodium: 139 mEq/L (ref 135–145)
TCO2: 22 mmol/L (ref 0–100)

## 2011-04-12 LAB — CBC
MCHC: 33.5 g/dL (ref 30.0–36.0)
RDW: 16.2 % — ABNORMAL HIGH (ref 11.5–15.5)

## 2011-04-23 ENCOUNTER — Emergency Department (HOSPITAL_COMMUNITY)
Admission: EM | Admit: 2011-04-23 | Discharge: 2011-04-24 | Disposition: A | Discharge status: Home / Self Care | Payer: Self-pay | Attending: Emergency Medicine | Admitting: Emergency Medicine

## 2011-04-23 DIAGNOSIS — M545 Low back pain, unspecified: Secondary | ICD-10-CM | POA: Insufficient documentation

## 2011-04-23 DIAGNOSIS — K219 Gastro-esophageal reflux disease without esophagitis: Secondary | ICD-10-CM | POA: Insufficient documentation

## 2011-04-24 ENCOUNTER — Emergency Department (HOSPITAL_COMMUNITY): Payer: Self-pay

## 2011-06-12 ENCOUNTER — Emergency Department (HOSPITAL_COMMUNITY)
Admission: EM | Admit: 2011-06-12 | Discharge: 2011-06-12 | Disposition: A | Discharge status: Home / Self Care | Payer: Self-pay | Attending: Emergency Medicine | Admitting: Emergency Medicine

## 2011-06-12 ENCOUNTER — Encounter (HOSPITAL_COMMUNITY): Payer: Self-pay | Admitting: *Deleted

## 2011-06-12 DIAGNOSIS — R10816 Epigastric abdominal tenderness: Secondary | ICD-10-CM | POA: Insufficient documentation

## 2011-06-12 DIAGNOSIS — R109 Unspecified abdominal pain: Secondary | ICD-10-CM | POA: Insufficient documentation

## 2011-06-12 DIAGNOSIS — R197 Diarrhea, unspecified: Secondary | ICD-10-CM | POA: Insufficient documentation

## 2011-06-12 DIAGNOSIS — R112 Nausea with vomiting, unspecified: Secondary | ICD-10-CM | POA: Insufficient documentation

## 2011-06-12 DIAGNOSIS — F17200 Nicotine dependence, unspecified, uncomplicated: Secondary | ICD-10-CM | POA: Insufficient documentation

## 2011-06-12 HISTORY — DX: Noninfective gastroenteritis and colitis, unspecified: K52.9

## 2011-06-12 HISTORY — DX: Gastro-esophageal reflux disease without esophagitis: K21.9

## 2011-06-12 LAB — CBC
HCT: 45.4 % (ref 39.0–52.0)
Hemoglobin: 15.3 g/dL (ref 13.0–17.0)
MCH: 29.1 pg (ref 26.0–34.0)
MCHC: 33.7 g/dL (ref 30.0–36.0)
MCV: 86.5 fL (ref 78.0–100.0)
Platelets: 185 10*3/uL (ref 150–400)
RBC: 5.25 MIL/uL (ref 4.22–5.81)
RDW: 14.4 % (ref 11.5–15.5)
WBC: 9.3 10*3/uL (ref 4.0–10.5)

## 2011-06-12 LAB — URINALYSIS, ROUTINE W REFLEX MICROSCOPIC
Glucose, UA: NEGATIVE mg/dL
Hgb urine dipstick: NEGATIVE
Ketones, ur: 40 mg/dL — AB
Leukocytes, UA: NEGATIVE
Nitrite: NEGATIVE
Protein, ur: NEGATIVE mg/dL
Specific Gravity, Urine: 1.028 (ref 1.005–1.030)
Urobilinogen, UA: 0.2 mg/dL (ref 0.0–1.0)
pH: 6 (ref 5.0–8.0)

## 2011-06-12 LAB — COMPREHENSIVE METABOLIC PANEL
ALT: 31 U/L (ref 0–53)
AST: 33 U/L (ref 0–37)
Albumin: 3.7 g/dL (ref 3.5–5.2)
Alkaline Phosphatase: 53 U/L (ref 39–117)
BUN: 7 mg/dL (ref 6–23)
CO2: 22 mEq/L (ref 19–32)
Calcium: 9 mg/dL (ref 8.4–10.5)
Chloride: 103 mEq/L (ref 96–112)
Creatinine, Ser: 0.84 mg/dL (ref 0.50–1.35)
GFR calc Af Amer: >90 mL/min (ref >90)
GFR calc non Af Amer: >90 mL/min (ref >90)
Glucose, Bld: 85 mg/dL (ref 70–99)
Potassium: 4 mEq/L (ref 3.5–5.1)
Sodium: 136 mEq/L (ref 135–145)
Total Bilirubin: 0.3 mg/dL (ref 0.3–1.2)
Total Protein: 6.8 g/dL (ref 6.0–8.3)

## 2011-06-12 LAB — LIPASE, BLOOD: Lipase: 15 U/L (ref 11–59)

## 2011-06-12 MED ORDER — SODIUM CHLORIDE 0.9 % IV BOLUS (SEPSIS)
1000.0000 mL | Freq: Once | INTRAVENOUS | Status: AC
  Administered 2011-06-12: 1000 mL via INTRAVENOUS

## 2011-06-12 MED ORDER — ONDANSETRON HCL 4 MG/2ML IJ SOLN
4.0000 mg | Freq: Once | INTRAMUSCULAR | Status: AC
  Administered 2011-06-12: 4 mg via INTRAVENOUS
  Filled 2011-06-12: qty 2

## 2011-06-12 MED ORDER — PREDNISONE 20 MG PO TABS
60.0000 mg | ORAL_TABLET | Freq: Once | ORAL | Status: AC
  Administered 2011-06-12: 60 mg via ORAL
  Filled 2011-06-12: qty 3

## 2011-06-12 MED ORDER — HYDROMORPHONE HCL PF 1 MG/ML IJ SOLN
1.0000 mg | Freq: Once | INTRAMUSCULAR | Status: AC
  Administered 2011-06-12: 1 mg via INTRAVENOUS

## 2011-06-12 MED ORDER — PREDNISONE 10 MG PO TABS: ORAL_TABLET | ORAL | Status: DC

## 2011-06-12 MED ORDER — HYDROMORPHONE HCL PF 2 MG/ML IJ SOLN
INTRAMUSCULAR | Status: AC
  Filled 2011-06-12: qty 1

## 2011-06-12 NOTE — ED Provider Notes (Signed)
History    55yM with abdominal pain and vomiting. Onset today. Pt with long hx of eosinophilic gastritis and says current symptoms very similar to previous. denies trauma. No fever or chills. diarrhea. NB emesis. No blood in stool or melena. No sick contacts. No new food exposures. Says gets like this every few moths and just needs pain medication and steroids.  CSN: [id number] Arrival date & time: 06/12/2011  5:49 PM   First MD Initiated Contact with Patient 06/12/11 1814      Chief Complaint  Patient presents with  . Abdominal Pain    (Consider location/radiation/quality/duration/timing/severity/associated sxs/prior treatment) HPI  Past Medical History  Diagnosis Date  . Gastroenteritis   . Acid reflux     Past Surgical History  Procedure Date  . Exploratory laparotomy w/ bowel resection   . Abdominal surgery     Family History  Problem Relation Age of Onset  . Diabetes Mother   . Hypertension Mother   . Stroke Father   . Heart failure Father   . Heart failure Other     History  Substance Use Topics  . Smoking status: Current Everyday Smoker -- 0.5 packs/day for 20 years    Types: Cigarettes  . Smokeless tobacco: Not on file  . Alcohol Use: 19.3 oz/week    4 Glasses of wine, 24 Cans of beer, 5 Drinks containing 0.5 oz of alcohol per week     Everyday drinker      Review of Systems   Review of symptoms negative unless otherwise noted in HPI.   Allergies  Penicillins  Home Medications   Current Outpatient Rx  Name Route Sig Dispense Refill  . ACETAMINOPHEN 325 MG PO TABS Oral Take 650 mg by mouth every 6 (six) hours as needed.      . IBUPROFEN 200 MG PO TABS Oral Take 800 mg by mouth every 6 (six) hours as needed.        BP 123/93  Pulse 103  Temp(Src) 99.1 F (37.3 C) (Oral)  Resp 18  Ht 5\' 10"  (1.778 m)  Wt 221 lb (100.245 kg)  BMI 31.71 kg/m2  SpO2 98%  Physical Exam  Nursing note and vitals reviewed. Constitutional: He appears  well-developed and well-nourished. No distress.  HENT:  Head: Normocephalic and atraumatic.  Eyes: Conjunctivae are normal. Right eye exhibits no discharge. Left eye exhibits no discharge.  Neck: Neck supple.  Cardiovascular: Normal rate, regular rhythm and normal heart sounds.  Exam reveals no gallop and no friction rub.   No murmur heard. Pulmonary/Chest: Effort normal and breath sounds normal. No respiratory distress.  Abdominal: Soft. He exhibits no distension and no mass. There is tenderness. There is no guarding.       Mild tenderness in epigastrium  Genitourinary:       No cva tenderness  Musculoskeletal: He exhibits no edema and no tenderness.  Neurological: He is alert.  Skin: Skin is warm and dry.  Psychiatric: He has a normal mood and affect. His behavior is normal. Thought content normal.    ED Course  Procedures (including critical care time)  Labs Reviewed  URINALYSIS, ROUTINE W REFLEX MICROSCOPIC - Abnormal; Notable for the following:    Color, Urine AMBER (*) BIOCHEMICALS MAY BE AFFECTED BY COLOR   Bilirubin Urine MODERATE (*)    Ketones, ur 40 (*)    All other components within normal limits  CBC  COMPREHENSIVE METABOLIC PANEL  LIPASE, BLOOD   No results found.   1.  Abdominal pain   2. Nausea and vomiting   3. Diarrhea       MDM  55yM with abdominal pain, n/v/d. Pt with hx of eosinophilic gastritis and reports pain same as previous. Repeat abdominal exam prior to DC with no tenderness. Pt states he always takes tapered dose of prednisone afterwards. W/u unremarkable. Low clinical suspicion for SBI or emergent surgical intraabdominal process. Will DC with meds and fu as outpt.        Raeford Razor, MD 06/14/11 [phone]

## 2011-06-12 NOTE — ED Notes (Signed)
Pt states that Wednesday of last week he began having diarrhea but that today he began vomiting and having extreme abdominal pain 10/10. Pt has vomited 3 times today. Patient has a long hx of gastroenteritis.

## 2011-08-19 ENCOUNTER — Emergency Department (HOSPITAL_COMMUNITY): Payer: Self-pay

## 2011-08-19 ENCOUNTER — Emergency Department (HOSPITAL_COMMUNITY)
Admission: EM | Admit: 2011-08-19 | Discharge: 2011-08-19 | Disposition: A | Discharge status: Home / Self Care | Payer: Self-pay | Attending: Emergency Medicine | Admitting: Emergency Medicine

## 2011-08-19 ENCOUNTER — Encounter (HOSPITAL_COMMUNITY): Payer: Self-pay

## 2011-08-19 DIAGNOSIS — R143 Flatulence: Secondary | ICD-10-CM | POA: Insufficient documentation

## 2011-08-19 DIAGNOSIS — F17200 Nicotine dependence, unspecified, uncomplicated: Secondary | ICD-10-CM | POA: Insufficient documentation

## 2011-08-19 DIAGNOSIS — R142 Eructation: Secondary | ICD-10-CM | POA: Insufficient documentation

## 2011-08-19 DIAGNOSIS — R10819 Abdominal tenderness, unspecified site: Secondary | ICD-10-CM | POA: Insufficient documentation

## 2011-08-19 DIAGNOSIS — R141 Gas pain: Secondary | ICD-10-CM | POA: Insufficient documentation

## 2011-08-19 DIAGNOSIS — K529 Noninfective gastroenteritis and colitis, unspecified: Secondary | ICD-10-CM

## 2011-08-19 DIAGNOSIS — R188 Other ascites: Secondary | ICD-10-CM | POA: Insufficient documentation

## 2011-08-19 DIAGNOSIS — R109 Unspecified abdominal pain: Secondary | ICD-10-CM | POA: Insufficient documentation

## 2011-08-19 DIAGNOSIS — K5289 Other specified noninfective gastroenteritis and colitis: Secondary | ICD-10-CM | POA: Insufficient documentation

## 2011-08-19 LAB — COMPREHENSIVE METABOLIC PANEL
ALT: 32 U/L (ref 0–53)
AST: 27 U/L (ref 0–37)
Albumin: 3.7 g/dL (ref 3.5–5.2)
Alkaline Phosphatase: 61 U/L (ref 39–117)
Chloride: 104 mEq/L (ref 96–112)
Potassium: 4 mEq/L (ref 3.5–5.1)
Sodium: 138 mEq/L (ref 135–145)
Total Bilirubin: 0.3 mg/dL (ref 0.3–1.2)
Total Protein: 6.8 g/dL (ref 6.0–8.3)

## 2011-08-19 LAB — CBC
HCT: 44.2 % (ref 39.0–52.0)
Hemoglobin: 15 g/dL (ref 13.0–17.0)
MCHC: 33.9 g/dL (ref 30.0–36.0)
RBC: 5.15 MIL/uL (ref 4.22–5.81)
RDW: 16.1 % — ABNORMAL HIGH (ref 11.5–15.5)

## 2011-08-19 LAB — DIFFERENTIAL
Basophils Absolute: 0 10*3/uL (ref 0.0–0.1)
Eosinophils Absolute: 7.1 10*3/uL — ABNORMAL HIGH (ref 0.0–0.7)
Lymphocytes Relative: 13 % (ref 12–46)
Monocytes Relative: 5 % (ref 3–12)
Neutrophils Relative %: 34 % — ABNORMAL LOW (ref 43–77)

## 2011-08-19 LAB — PATHOLOGIST SMEAR REVIEW

## 2011-08-19 MED ORDER — OXYCODONE-ACETAMINOPHEN 5-325 MG PO TABS: 1.0000 | ORAL_TABLET | Freq: Four times a day (QID) | ORAL | Status: AC | PRN

## 2011-08-19 MED ORDER — HYDROMORPHONE HCL PF 1 MG/ML IJ SOLN
1.0000 mg | Freq: Once | INTRAMUSCULAR | Status: AC
  Administered 2011-08-19: 1 mg via INTRAVENOUS
  Filled 2011-08-19: qty 1

## 2011-08-19 MED ORDER — METHYLPREDNISOLONE SODIUM SUCC 125 MG IJ SOLR
125.0000 mg | Freq: Once | INTRAMUSCULAR | Status: AC
  Administered 2011-08-19: 125 mg via INTRAVENOUS
  Filled 2011-08-19: qty 2

## 2011-08-19 MED ORDER — METRONIDAZOLE 500 MG PO TABS: 500.0000 mg | ORAL_TABLET | Freq: Two times a day (BID) | ORAL | Status: AC

## 2011-08-19 MED ORDER — IOHEXOL 300 MG/ML  SOLN
100.0000 mL | Freq: Once | INTRAMUSCULAR | Status: AC | PRN
  Administered 2011-08-19: 100 mL via INTRAVENOUS

## 2011-08-19 MED ORDER — PROMETHAZINE HCL 25 MG/ML IJ SOLN
12.5000 mg | Freq: Once | INTRAMUSCULAR | Status: AC
  Administered 2011-08-19: 12.5 mg via INTRAVENOUS
  Filled 2011-08-19: qty 1

## 2011-08-19 MED ORDER — CIPROFLOXACIN HCL 500 MG PO TABS: 500.0000 mg | ORAL_TABLET | Freq: Two times a day (BID) | ORAL | Status: AC

## 2011-08-19 NOTE — ED Notes (Signed)
Pt returned from CT °

## 2011-08-19 NOTE — ED Provider Notes (Signed)
History     CSN: [id number]  Arrival date & time 08/19/11  0137   First MD Initiated Contact with Patient 08/19/11 [phone]      Chief Complaint  Patient presents with  . Abdominal Pain     HPI  History provided patient. Patient is a 56 year old male with history of eosinophilic colitis who presents with complaints of abdominal pain, vomiting and diarrhea similar to prior symptoms.  Symptoms began one week ago with diarrhea. Patient has had gradual increase in abdominal pains and episodes of nausea and vomiting. He has decreased by mouth intake. he denies any fever, chills, sweats. Patient denies any rectal bleeding, lightheadedness, chest pain or shortness of breath. Patient denies any aggravating or alleviating factors. Symptoms are described as severe. he has been seen in emergency room for similar symptoms multiple times.   Past Medical History  Diagnosis Date  . Gastroenteritis   . Acid reflux     Past Surgical History  Procedure Date  . Exploratory laparotomy w/ bowel resection   . Abdominal surgery     Family History  Problem Relation Age of Onset  . Diabetes Mother   . Hypertension Mother   . Stroke Father   . Heart failure Father   . Heart failure Other     History  Substance Use Topics  . Smoking status: Current Everyday Smoker -- 0.5 packs/day for 20 years    Types: Cigarettes  . Smokeless tobacco: Not on file  . Alcohol Use: 19.3 oz/week    4 Glasses of wine, 24 Cans of beer, 5 Drinks containing 0.5 oz of alcohol per week     Everyday drinker      Review of Systems  Constitutional: Negative for fever and chills.  Gastrointestinal: Positive for nausea, vomiting, abdominal pain and diarrhea.  All other systems reviewed and are negative.    Allergies  Penicillins  Home Medications   Current Outpatient Rx  Name Route Sig Dispense Refill  . ACETAMINOPHEN 325 MG PO TABS Oral Take 650 mg by mouth every 6 (six) hours as needed.      . IBUPROFEN 200  MG PO TABS Oral Take 800 mg by mouth every 6 (six) hours as needed.      Marland Kitchen PREDNISONE 10 MG PO TABS  Please take 3 tabs daily for 4 days, then 2 tabs for 4 days, 1 tab for 4 days, 1/2 tab for 4 days then stop. 30 tablet 0    BP 142/95  Pulse 105  Temp(Src) 98.3 F (36.8 C) (Oral)  Resp 20  Wt 217 lb (98.431 kg)  SpO2 99%  Physical Exam  Nursing note and vitals reviewed. Constitutional: He is oriented to person, place, and time. He appears well-developed and well-nourished. No distress.  HENT:  Head: Normocephalic and atraumatic.  Mouth/Throat: Oropharynx is clear and moist.  Cardiovascular: Normal rate and regular rhythm.   Pulmonary/Chest: Effort normal and breath sounds normal. No respiratory distress. He has no wheezes. He has no rales.  Abdominal: Soft. He exhibits distension. He exhibits no mass. There is tenderness. There is no rebound, no guarding, no CVA tenderness and no tenderness at McBurney's point.       Moderate severe diffuse abdominal tenderness  Neurological: He is alert and oriented to person, place, and time.  Skin: Skin is warm.  Psychiatric: He has a normal mood and affect. His behavior is normal.    ED Course  Procedures  Results for orders placed during the hospital encounter  of 08/19/11  CBC      Component Value Range   WBC 14.7 (*) 4.0 - 10.5 (K/uL)   RBC 5.15  4.22 - 5.81 (MIL/uL)   Hemoglobin 15.0  13.0 - 17.0 (g/dL)   HCT 45.4  09.8 - 11.9 (%)   MCV 85.8  78.0 - 100.0 (fL)   MCH 29.1  26.0 - 34.0 (pg)   MCHC 33.9  30.0 - 36.0 (g/dL)   RDW 14.7 (*) 82.9 - 15.5 (%)   Platelets 242  150 - 400 (K/uL)  DIFFERENTIAL      Component Value Range   Neutrophils Relative 34 (*) 43 - 77 (%)   Lymphocytes Relative 13  12 - 46 (%)   Monocytes Relative 5  3 - 12 (%)   Eosinophils Relative 48 (*) 0 - 5 (%)   Basophils Relative 0  0 - 1 (%)   Neutro Abs 5.0  1.7 - 7.7 (K/uL)   Lymphs Abs 1.9  0.7 - 4.0 (K/uL)   Monocytes Absolute 0.7  0.1 - 1.0 (K/uL)    Eosinophils Absolute 7.1 (*) 0.0 - 0.7 (K/uL)   Basophils Absolute 0.0  0.0 - 0.1 (K/uL)   Smear Review MORPHOLOGY UNREMARKABLE    COMPREHENSIVE METABOLIC PANEL      Component Value Range   Sodium 138  135 - 145 (mEq/L)   Potassium 4.0  3.5 - 5.1 (mEq/L)   Chloride 104  96 - 112 (mEq/L)   CO2 23  19 - 32 (mEq/L)   Glucose, Bld 96  70 - 99 (mg/dL)   BUN 9  6 - 23 (mg/dL)   Creatinine, Ser 5.62  0.50 - 1.35 (mg/dL)   Calcium 8.9  8.4 - 13.0 (mg/dL)   Total Protein 6.8  6.0 - 8.3 (g/dL)   Albumin 3.7  3.5 - 5.2 (g/dL)   AST 27  0 - 37 (U/L)   ALT 32  0 - 53 (U/L)   Alkaline Phosphatase 61  39 - 117 (U/L)   Total Bilirubin 0.3  0.3 - 1.2 (mg/dL)   GFR calc non Af Amer >90  >90 (mL/min)   GFR calc Af Amer >90  >90 (mL/min)     Dg Abd Acute W/chest  08/19/2011  *RADIOLOGY REPORT*  Clinical Data: Mid abdominal pain, nausea and vomiting for 1 day. Smoker.  ACUTE ABDOMEN SERIES (ABDOMEN 2 VIEW & CHEST 1 VIEW)  Comparison: 11/27/2010  Findings: Shallow inspiration.  Normal heart size and pulmonary vascularity.  Linear infiltration or atelectasis in the left lung base.  No pneumothorax.  No blunting of costophrenic angles. Metallic structure projected over the left upper quadrant suggesting body piercing.  There is a paucity of gas in the abdomen with gas filled distended right upper quadrant small bowel loops and air-fluid levels consistent with small bowel obstruction.  No free intra-abdominal air.  Postoperative changes in the left upper quadrant.  IMPRESSION: Gas distended right upper quadrant small bowel with air-fluid levels suggesting obstruction.  Linear atelectasis or infiltration in the left lung base.  Original Report Authenticated By: Marlon Pel, M.D.     No diagnosis found.    MDM  3:30 AM patient seen and evaluated. Patient no acute distress.  Pt discussed with attending physician.  Will get CT for better evaluation of possible SBO.  6:00 Pt discussed in sign out with  Ebbie Ridge PAC.  He will follow results of CT.      Angus Seller, [place] 08/19/11 [phone]

## 2011-08-19 NOTE — ED Notes (Signed)
Patient transported to CT 

## 2011-08-19 NOTE — ED Notes (Signed)
Admission back in dec for presenting complaint- diarrhea x 2 weeks

## 2011-08-19 NOTE — ED Notes (Signed)
Pt tolerating po fluids well  

## 2011-08-19 NOTE — ED Notes (Signed)
Pt states that he has had diarrhea x 1 week.  Pt states that his pain started circa 4 hours ago and is described as "grabbing" and 10/10, coming in waves.  Pt states that his intestines hurt moreso than his stomach.  Pt has hx of same and states that prednisone and "something for pain" usually work.

## 2011-08-19 NOTE — ED Provider Notes (Signed)
  Physical Exam  BP 124/80  Pulse 76  Temp(Src) 98 F (36.7 C) (Oral)  Resp 18  Wt 217 lb (98.431 kg)  SpO2 97%  Physical Exam  ED Course  Procedures  MDM The patient has been stable. I spoke with Sondra Come, [name] from [facility] and she felt that follow up was appropriate for the patient as long as he remain stable. Will have him go home on Cipro and Flagyl along with pain medications. The patient will be advised to return here as needed.       Carlyle Dolly, [name] 08/19/11 1122

## 2011-08-19 NOTE — ED Provider Notes (Signed)
Medical screening examination/treatment/procedure(s) were performed by non-physician practitioner and as supervising physician I was immediately available for consultation/collaboration.   Keonda Dow L Sadiq Mccauley, MD 08/19/11 0800 

## 2011-08-19 NOTE — ED Notes (Signed)
[name], is the pt's contact:  # [phone] (cell), [phone] (work).Marland KitchenMarland KitchenLaundry department.

## 2011-11-14 ENCOUNTER — Emergency Department (HOSPITAL_COMMUNITY): Payer: Worker's Compensation

## 2011-11-14 ENCOUNTER — Emergency Department (HOSPITAL_COMMUNITY)
Admission: EM | Admit: 2011-11-14 | Discharge: 2011-11-14 | Disposition: A | Discharge status: Home / Self Care | Payer: Worker's Compensation | Attending: Emergency Medicine | Admitting: Emergency Medicine

## 2011-11-14 ENCOUNTER — Encounter (HOSPITAL_COMMUNITY): Payer: Self-pay | Admitting: Emergency Medicine

## 2011-11-14 DIAGNOSIS — S63509A Unspecified sprain of unspecified wrist, initial encounter: Secondary | ICD-10-CM | POA: Insufficient documentation

## 2011-11-14 DIAGNOSIS — X58XXXA Exposure to other specified factors, initial encounter: Secondary | ICD-10-CM | POA: Insufficient documentation

## 2011-11-14 DIAGNOSIS — K219 Gastro-esophageal reflux disease without esophagitis: Secondary | ICD-10-CM | POA: Insufficient documentation

## 2011-11-14 MED ORDER — IBUPROFEN 800 MG PO TABS
800.0000 mg | ORAL_TABLET | Freq: Once | ORAL | Status: AC
  Administered 2011-11-14: 800 mg via ORAL
  Filled 2011-11-14: qty 1

## 2011-11-14 MED ORDER — IBUPROFEN 800 MG PO TABS: 800.0000 mg | ORAL_TABLET | Freq: Three times a day (TID) | ORAL | Status: AC

## 2011-11-14 NOTE — Progress Notes (Signed)
Orthopedic Tech Progress Note Patient Details:  [name] 06-08-1956 [id number]  Other Ortho Devices Ortho Device Location: wrist splint Ortho Device Interventions: Application   Cammer, Mickie Bail 11/14/2011, 7:59 AM

## 2011-11-14 NOTE — ED Notes (Signed)
Paged Ortho Tech for right wrist splint.

## 2011-11-14 NOTE — ED Notes (Signed)
First meeting with patient. Patient just returned from xray. Patients states he is continuing to have right wrist pain and denies pain in right arm at this time.

## 2011-11-14 NOTE — ED Provider Notes (Signed)
History     CSN: [id number]  Arrival date & time 11/14/11  0614   First MD Initiated Contact with Patient 11/14/11 [phone]      Chief Complaint  Patient presents with  . Wrist Pain    (Consider location/radiation/quality/duration/timing/severity/associated sxs/prior treatment) HPI Comments: Patient presents with right wrist pain that onset this morning as he was turning a key in a lock. He denies any direct trauma to the wrist, he denies any pain before this.  He felt a pop on the radial side of his right wrist and now has pain on the ulnar side it radiates to his elbow. He denies any weakness, numbness, tingling, vomiting or fever. He has no pain in his other joints. He is right-handed  The history is provided by the patient.    Past Medical History  Diagnosis Date  . Gastroenteritis   . Acid reflux     Past Surgical History  Procedure Date  . Exploratory laparotomy w/ bowel resection   . Abdominal surgery     Family History  Problem Relation Age of Onset  . Diabetes Mother   . Hypertension Mother   . Stroke Father   . Heart failure Father   . Heart failure Other     History  Substance Use Topics  . Smoking status: Current Everyday Smoker -- 0.5 packs/day for 20 years    Types: Cigarettes  . Smokeless tobacco: Not on file  . Alcohol Use: 19.3 oz/week    4 Glasses of wine, 24 Cans of beer, 5 Drinks containing 0.5 oz of alcohol per week     Everyday drinker      Review of Systems  Constitutional: Negative for activity change and appetite change.  Respiratory: Negative for shortness of breath.   Cardiovascular: Negative for chest pain.  Musculoskeletal: Positive for myalgias and arthralgias.    Allergies  Penicillins  Home Medications   Current Outpatient Rx  Name Route Sig Dispense Refill  . LANSOPRAZOLE 15 MG PO CPDR Oral Take 15 mg by mouth daily.      BP 142/93  Pulse 102  Temp(Src) 97.9 F (36.6 C) (Oral)  Resp 20  SpO2 97%  Physical Exam    Constitutional: He is oriented to person, place, and time. He appears well-developed and well-nourished. No distress.  HENT:  Head: Normocephalic and atraumatic.  Mouth/Throat: Oropharynx is clear and moist. No oropharyngeal exudate.  Eyes: Conjunctivae are normal. Pupils are equal, round, and reactive to light.  Neck: Normal range of motion. Neck supple.  Cardiovascular: Normal rate, regular rhythm and normal heart sounds.   Pulmonary/Chest: Effort normal and breath sounds normal. No respiratory distress.  Abdominal: Soft. There is no tenderness. There is no rebound and no guarding.  Musculoskeletal: Normal range of motion. He exhibits tenderness.       Tenderness palpation of the ulnar right wrist. There is no joint effusion there is full range of motion without pain. There is +2 radial pulse, cardinal hand movements are intact. There is equal grip strength bilaterally.  Neurological: He is alert and oriented to person, place, and time. No cranial nerve deficit.  Skin: Skin is warm.    ED Course  Procedures (including critical care time)  Labs Reviewed - No data to display Dg Wrist Complete Right  11/14/2011  *RADIOLOGY REPORT*  Clinical Data: Twisting injury, wrist pain.  RIGHT WRIST - COMPLETE 3+ VIEW  Comparison: None.  Findings: No acute bony abnormality.  Specifically, no fracture, subluxation, or dislocation.  Soft tissues are intact.  There is spaces are maintained.  IMPRESSION: No acute bony abnormality.  Original Report Authenticated By: Cyndie Chime, M.D.     No diagnosis found.    MDM  Right wrist pain while turning a key. No deformity, no neurovascular deficit.  No evidence of effusion or septic joint.  Splint, NSAIDs, f/u hand.      Glynn Octave, MD 11/14/11 [phone]

## 2011-11-14 NOTE — ED Notes (Signed)
PT. REPORTS RIGHT WRIST PAIN ONSET THIS MORNING WHILE OPENING DOOR WITH A KEY , STATES " I FELT A POP".

## 2011-11-14 NOTE — ED Notes (Signed)
Pt stated that he heard a pop in his right wrist this AM. And since then he has been having a sharp aching pain in his right wrist. The pain radiates up to his elbow. He states that there has not been any injury or trauma to that arm. However, pt states that he does use his wrist a lot with his profession. Pt is rt handed. Left arm does not hurt. No swelling or deformity noted in rt arm. Neurovascular WNL. Will continue to monitor.

## 2011-11-14 NOTE — Discharge Instructions (Signed)
Joint Sprain A sprain is a tear or stretch in the ligaments that hold a joint together. Severe sprains may need as long as 3-6 weeks of immobilization and/or exercises to heal completely. Sprained joints should be rested and protected. If not, they can become unstable and prone to re-injury. Proper treatment can reduce your pain, shorten the period of disability, and reduce the risk of repeated injuries. TREATMENT   Rest and elevate the injured joint to reduce pain and swelling.   Apply ice packs to the injury for 20-30 minutes every 2-3 hours for the next 2-3 days.   Keep the injury wrapped in a compression bandage or splint as long as the joint is painful or as instructed by your caregiver.   Do not use the injured joint until it is completely healed to prevent re-injury and chronic instability. Follow the instructions of your caregiver.   Long-term sprain management may require exercises and/or treatment by a physical therapist. Taping or special braces may help stabilize the joint until it is completely better.  SEEK MEDICAL CARE IF:   You develop increased pain or swelling of the joint.   You develop increasing redness and warmth of the joint.   You develop a fever.   It becomes stiff.   Your hand or foot gets cold or numb.  Document Released: 08/01/2004 Document Revised: 06/13/2011 Document Reviewed: 07/11/2008 [employer] Patient Information 2012 [employer]. 

## 2011-11-14 NOTE — ED Notes (Signed)
Lab made aware of workmen's comp and the need for drug screen. Phlebotomist given pts paperwork

## 2011-11-14 NOTE — ED Notes (Signed)
Ortho Tech called and they will bring splint to room asap.

## 2011-12-05 ENCOUNTER — Emergency Department: Payer: Self-pay | Admitting: Emergency Medicine

## 2011-12-05 LAB — URINALYSIS, COMPLETE
Bilirubin,UR: NEGATIVE
Ketone: NEGATIVE
Ph: 6 (ref 4.5–8.0)
Protein: NEGATIVE
RBC,UR: 4 /HPF (ref 0–5)
Specific Gravity: 1.004 (ref 1.003–1.030)
Squamous Epithelial: NONE SEEN
WBC UR: 1 /HPF (ref 0–5)

## 2012-02-07 ENCOUNTER — Emergency Department: Payer: Self-pay | Admitting: Emergency Medicine

## 2012-04-28 ENCOUNTER — Emergency Department: Payer: Self-pay | Admitting: Emergency Medicine

## 2012-04-28 LAB — COMPREHENSIVE METABOLIC PANEL
Anion Gap: 10 (ref 7–16)
Calcium, Total: 8.4 mg/dL — ABNORMAL LOW (ref 8.5–10.1)
Chloride: 108 mmol/L — ABNORMAL HIGH (ref 98–107)
Co2: 23 mmol/L (ref 21–32)
EGFR (African American): >60
EGFR (Non-African Amer.): >60
Glucose: 92 mg/dL (ref 65–99)
Osmolality: 279 (ref 275–301)
Potassium: 3.8 mmol/L (ref 3.5–5.1)
SGOT(AST): 92 U/L — ABNORMAL HIGH (ref 15–37)
SGPT (ALT): 116 U/L — ABNORMAL HIGH (ref 12–78)
Sodium: 141 mmol/L (ref 136–145)

## 2012-04-28 LAB — LIPASE, BLOOD: Lipase: 98 U/L (ref 73–393)

## 2012-04-28 LAB — CBC
HGB: 16.1 g/dL (ref 13.0–18.0)
MCH: 31 pg (ref 26.0–34.0)
MCHC: 34.8 g/dL (ref 32.0–36.0)
Platelet: 147 10*3/uL — ABNORMAL LOW (ref 150–440)
RBC: 5.21 10*6/uL (ref 4.40–5.90)
RDW: 15.2 % — ABNORMAL HIGH (ref 11.5–14.5)
WBC: 7.8 10*3/uL (ref 3.8–10.6)

## 2012-04-29 LAB — URINALYSIS, COMPLETE
Bacteria: NONE SEEN
Bilirubin,UR: NEGATIVE
Glucose,UR: NEGATIVE mg/dL (ref 0–75)
Leukocyte Esterase: NEGATIVE
Nitrite: NEGATIVE
RBC,UR: 2 /HPF (ref 0–5)
Specific Gravity: 1.015 (ref 1.003–1.030)
Squamous Epithelial: NONE SEEN

## 2012-05-08 ENCOUNTER — Ambulatory Visit: Payer: Self-pay | Admitting: Internal Medicine

## 2012-05-12 ENCOUNTER — Emergency Department: Payer: Self-pay | Admitting: Emergency Medicine

## 2012-05-12 LAB — CBC
HGB: 15.3 g/dL (ref 13.0–18.0)
MCV: 89 fL (ref 80–100)
Platelet: 171 10*3/uL (ref 150–440)
RBC: 5.1 10*6/uL (ref 4.40–5.90)
RDW: 14.9 % — ABNORMAL HIGH (ref 11.5–14.5)
WBC: 20.2 10*3/uL — ABNORMAL HIGH (ref 3.8–10.6)

## 2012-05-12 LAB — COMPREHENSIVE METABOLIC PANEL
Albumin: 3.8 g/dL (ref 3.4–5.0)
Alkaline Phosphatase: 77 U/L (ref 50–136)
Anion Gap: 10 (ref 7–16)
Calcium, Total: 8.6 mg/dL (ref 8.5–10.1)
Co2: 26 mmol/L (ref 21–32)
EGFR (Non-African Amer.): >60
Glucose: 95 mg/dL (ref 65–99)
Osmolality: 275 (ref 275–301)
Potassium: 3.9 mmol/L (ref 3.5–5.1)
SGOT(AST): 18 U/L (ref 15–37)
SGPT (ALT): 32 U/L (ref 12–78)
Total Protein: 7.2 g/dL (ref 6.4–8.2)

## 2012-05-12 LAB — URINALYSIS, COMPLETE
Bilirubin,UR: NEGATIVE
Nitrite: NEGATIVE
Protein: 100
Specific Gravity: 1.032 (ref 1.003–1.030)
Squamous Epithelial: NONE SEEN
WBC UR: 1 /HPF (ref 0–5)

## 2012-05-12 LAB — PROTIME-INR: Prothrombin Time: 13.6 secs (ref 11.5–14.7)

## 2012-05-20 LAB — COMPREHENSIVE METABOLIC PANEL
Albumin: 3.9 g/dL (ref 3.4–5.0)
Anion Gap: 15 (ref 7–16)
Bilirubin,Total: 0.5 mg/dL (ref 0.2–1.0)
Chloride: 95 mmol/L — ABNORMAL LOW (ref 98–107)
Co2: 23 mmol/L (ref 21–32)
Creatinine: 0.74 mg/dL (ref 0.60–1.30)
EGFR (African American): >60
EGFR (Non-African Amer.): >60
Osmolality: 265 (ref 275–301)
Potassium: 3.1 mmol/L — ABNORMAL LOW (ref 3.5–5.1)
SGOT(AST): 19 U/L (ref 15–37)
SGPT (ALT): 19 U/L (ref 12–78)

## 2012-05-20 LAB — CBC
HCT: 51.5 % (ref 40.0–52.0)
MCHC: 33.6 g/dL (ref 32.0–36.0)
MCV: 88 fL (ref 80–100)
Platelet: 244 10*3/uL (ref 150–440)
RBC: 5.83 10*6/uL (ref 4.40–5.90)
RDW: 14.8 % — ABNORMAL HIGH (ref 11.5–14.5)
WBC: 39.1 10*3/uL — ABNORMAL HIGH (ref 3.8–10.6)

## 2012-05-20 LAB — DIFFERENTIAL
Comment - H1-Com1: NORMAL
Comment - H1-Com2: NORMAL
Eosinophil %: 78.2 %
Lymphocyte %: 7.6 %
Monocyte #: 1.2 x10 3/mm — ABNORMAL HIGH (ref 0.2–1.0)
Monocyte %: 3.2 %
Neutrophil %: 10.1 %

## 2012-05-20 LAB — URINALYSIS, COMPLETE
Bilirubin,UR: NEGATIVE
Glucose,UR: NEGATIVE mg/dL (ref 0–75)
Nitrite: NEGATIVE
Ph: 5 (ref 4.5–8.0)
RBC,UR: 7 /HPF (ref 0–5)
Squamous Epithelial: NONE SEEN
WBC UR: 2 /HPF (ref 0–5)

## 2012-05-20 LAB — LIPASE, BLOOD: Lipase: 111 U/L (ref 73–393)

## 2012-05-21 ENCOUNTER — Inpatient Hospital Stay: Payer: Self-pay | Admitting: Specialist

## 2012-05-21 LAB — OCCULT BLOOD X 1 CARD TO LAB, STOOL: Occult Blood, Feces: POSITIVE

## 2012-05-22 LAB — COMPREHENSIVE METABOLIC PANEL
Alkaline Phosphatase: 57 U/L (ref 50–136)
Calcium, Total: 8.1 mg/dL — ABNORMAL LOW (ref 8.5–10.1)
Chloride: 101 mmol/L (ref 98–107)
Co2: 27 mmol/L (ref 21–32)
EGFR (African American): >60
EGFR (Non-African Amer.): >60
Glucose: 116 mg/dL — ABNORMAL HIGH (ref 65–99)
SGOT(AST): 21 U/L (ref 15–37)
SGPT (ALT): 14 U/L (ref 12–78)
Total Protein: 6.1 g/dL — ABNORMAL LOW (ref 6.4–8.2)

## 2012-05-22 LAB — LIPID PANEL
Ldl Cholesterol, Calc: 95 mg/dL (ref 0–100)
Triglycerides: 99 mg/dL (ref 0–200)
VLDL Cholesterol, Calc: 20 mg/dL (ref 5–40)

## 2012-05-22 LAB — CBC WITH DIFFERENTIAL/PLATELET
Basophil #: 0 10*3/uL (ref 0.0–0.1)
Basophil %: 0.2 %
Eosinophil %: 81.1 %
HGB: 14.9 g/dL (ref 13.0–18.0)
Lymphocyte #: 2.1 10*3/uL (ref 1.0–3.6)
Lymphocyte %: 7.6 %
MCH: 30.6 pg (ref 26.0–34.0)
MCV: 88 fL (ref 80–100)
Monocyte #: 0.8 x10 3/mm (ref 0.2–1.0)
Neutrophil #: 2.3 10*3/uL (ref 1.4–6.5)
Platelet: 204 10*3/uL (ref 150–440)
RDW: 14.7 % — ABNORMAL HIGH (ref 11.5–14.5)
WBC: 27.2 10*3/uL — ABNORMAL HIGH (ref 3.8–10.6)

## 2012-05-22 LAB — WBCS, STOOL

## 2012-05-22 LAB — SEDIMENTATION RATE: Erythrocyte Sed Rate: 1 mm/hr (ref 0–20)

## 2012-05-23 LAB — CBC WITH DIFFERENTIAL/PLATELET
Bands: 1 %
Eosinophil: 68 %
HCT: 37.7 % — ABNORMAL LOW (ref 40.0–52.0)
HGB: 13.3 g/dL (ref 13.0–18.0)
MCH: 30.9 pg (ref 26.0–34.0)
MCHC: 35.4 g/dL (ref 32.0–36.0)
RBC: 4.32 10*6/uL — ABNORMAL LOW (ref 4.40–5.90)
RDW: 14.5 % (ref 11.5–14.5)
Segmented Neutrophils: 17 %

## 2012-05-24 LAB — CBC WITH DIFFERENTIAL/PLATELET
Basophil #: 0 10*3/uL (ref 0.0–0.1)
Basophil %: 0.2 %
Eosinophil %: 58.6 %
HCT: 36.1 % — ABNORMAL LOW (ref 40.0–52.0)
HGB: 12 g/dL — ABNORMAL LOW (ref 13.0–18.0)
Lymphocyte #: 1.2 10*3/uL (ref 1.0–3.6)
Lymphocyte %: 8.2 %
MCV: 88 fL (ref 80–100)
Monocyte %: 3.3 %
Neutrophil #: 4.3 10*3/uL (ref 1.4–6.5)
Platelet: 174 10*3/uL (ref 150–440)
RBC: 4.1 10*6/uL — ABNORMAL LOW (ref 4.40–5.90)
WBC: 14.6 10*3/uL — ABNORMAL HIGH (ref 3.8–10.6)

## 2012-05-24 LAB — BASIC METABOLIC PANEL
BUN: 4 mg/dL — ABNORMAL LOW (ref 7–18)
Co2: 26 mmol/L (ref 21–32)
EGFR (African American): >60
EGFR (Non-African Amer.): >60
Glucose: 136 mg/dL — ABNORMAL HIGH (ref 65–99)
Potassium: 4 mmol/L (ref 3.5–5.1)
Sodium: 138 mmol/L (ref 136–145)

## 2012-05-24 LAB — MAGNESIUM: Magnesium: 1.5 mg/dL — ABNORMAL LOW

## 2012-05-25 DIAGNOSIS — R0602 Shortness of breath: Secondary | ICD-10-CM

## 2012-05-26 LAB — CULTURE, BLOOD (SINGLE)

## 2012-05-27 LAB — PATHOLOGY REPORT

## 2012-06-07 ENCOUNTER — Ambulatory Visit: Payer: Self-pay | Admitting: Internal Medicine

## 2012-07-27 ENCOUNTER — Emergency Department: Payer: Self-pay | Admitting: Emergency Medicine

## 2012-12-14 ENCOUNTER — Emergency Department: Payer: Self-pay | Admitting: Emergency Medicine

## 2012-12-14 LAB — COMPREHENSIVE METABOLIC PANEL
Albumin: 3.8 g/dL (ref 3.4–5.0)
Alkaline Phosphatase: 55 U/L (ref 50–136)
Anion Gap: 8 (ref 7–16)
BUN: 8 mg/dL (ref 7–18)
Calcium, Total: 8.6 mg/dL (ref 8.5–10.1)
Chloride: 110 mmol/L — ABNORMAL HIGH (ref 98–107)
Co2: 24 mmol/L (ref 21–32)
Creatinine: 0.83 mg/dL (ref 0.60–1.30)
EGFR (African American): >60
Glucose: 97 mg/dL (ref 65–99)
Osmolality: 281 (ref 275–301)
SGPT (ALT): 31 U/L (ref 12–78)
Sodium: 142 mmol/L (ref 136–145)

## 2012-12-14 LAB — URINALYSIS, COMPLETE
Bilirubin,UR: NEGATIVE
Ketone: NEGATIVE
Ph: 7 (ref 4.5–8.0)
Protein: NEGATIVE
Specific Gravity: 1.004 (ref 1.003–1.030)
Squamous Epithelial: NONE SEEN
WBC UR: NONE SEEN /HPF (ref 0–5)

## 2012-12-14 LAB — DRUG SCREEN, URINE
Amphetamines, Ur Screen: NEGATIVE (ref ?–1000)
Benzodiazepine, Ur Scrn: NEGATIVE (ref ?–200)
Cannabinoid 50 Ng, Ur ~~LOC~~: NEGATIVE (ref ?–50)
MDMA (Ecstasy)Ur Screen: NEGATIVE (ref ?–500)
Phencyclidine (PCP) Ur S: NEGATIVE (ref ?–25)
Tricyclic, Ur Screen: NEGATIVE (ref ?–1000)

## 2012-12-14 LAB — CBC
HGB: 14.4 g/dL (ref 13.0–18.0)
MCV: 87 fL (ref 80–100)
RBC: 4.91 10*6/uL (ref 4.40–5.90)
RDW: 15 % — ABNORMAL HIGH (ref 11.5–14.5)
WBC: 5 10*3/uL (ref 3.8–10.6)

## 2012-12-14 LAB — TROPONIN I: Troponin-I: <0.02 ng/mL

## 2012-12-14 LAB — ETHANOL: Ethanol: 160 mg/dL

## 2013-02-05 ENCOUNTER — Emergency Department: Payer: Self-pay | Admitting: Internal Medicine

## 2013-02-05 LAB — CBC
HGB: 15.1 g/dL (ref 13.0–18.0)
MCHC: 34.7 g/dL (ref 32.0–36.0)
RDW: 15.1 % — ABNORMAL HIGH (ref 11.5–14.5)

## 2013-02-05 LAB — BASIC METABOLIC PANEL
BUN: 6 mg/dL — ABNORMAL LOW (ref 7–18)
Chloride: 108 mmol/L — ABNORMAL HIGH (ref 98–107)
EGFR (Non-African Amer.): >60
Osmolality: 275 (ref 275–301)
Sodium: 139 mmol/L (ref 136–145)

## 2013-03-24 ENCOUNTER — Emergency Department: Payer: Self-pay | Admitting: Emergency Medicine

## 2013-03-24 LAB — COMPREHENSIVE METABOLIC PANEL
Albumin: 3.9 g/dL (ref 3.4–5.0)
Alkaline Phosphatase: 69 U/L (ref 50–136)
Bilirubin,Total: 0.5 mg/dL (ref 0.2–1.0)
Calcium, Total: 8.9 mg/dL (ref 8.5–10.1)
Chloride: 109 mmol/L — ABNORMAL HIGH (ref 98–107)
Co2: 21 mmol/L (ref 21–32)
Creatinine: 0.86 mg/dL (ref 0.60–1.30)
EGFR (Non-African Amer.): >60
Glucose: 110 mg/dL — ABNORMAL HIGH (ref 65–99)
Osmolality: 274 (ref 275–301)
SGOT(AST): 57 U/L — ABNORMAL HIGH (ref 15–37)
SGPT (ALT): 80 U/L — ABNORMAL HIGH (ref 12–78)
Total Protein: 7.5 g/dL (ref 6.4–8.2)

## 2013-03-24 LAB — CBC
HCT: 46.8 % (ref 40.0–52.0)
Platelet: 167 10*3/uL (ref 150–440)
WBC: 7 10*3/uL (ref 3.8–10.6)

## 2013-03-24 LAB — URINALYSIS, COMPLETE
Bacteria: NONE SEEN
Bilirubin,UR: NEGATIVE
Leukocyte Esterase: NEGATIVE
Nitrite: NEGATIVE
RBC,UR: 1 /HPF (ref 0–5)
Specific Gravity: 1 (ref 1.003–1.030)
WBC UR: NONE SEEN /HPF (ref 0–5)

## 2013-04-15 ENCOUNTER — Emergency Department: Payer: Self-pay | Admitting: Emergency Medicine

## 2013-05-13 ENCOUNTER — Emergency Department: Payer: Self-pay | Admitting: Emergency Medicine

## 2013-05-14 LAB — COMPREHENSIVE METABOLIC PANEL
Albumin: 3.9 g/dL (ref 3.4–5.0)
Alkaline Phosphatase: 73 U/L (ref 50–136)
BUN: 10 mg/dL (ref 7–18)
Calcium, Total: 8.9 mg/dL (ref 8.5–10.1)
Chloride: 109 mmol/L — ABNORMAL HIGH (ref 98–107)
Creatinine: 0.95 mg/dL (ref 0.60–1.30)
EGFR (African American): >60
EGFR (Non-African Amer.): >60
Glucose: 94 mg/dL (ref 65–99)
Osmolality: 280 (ref 275–301)
Total Protein: 7.3 g/dL (ref 6.4–8.2)

## 2013-05-14 LAB — CBC
HCT: 43.4 % (ref 40.0–52.0)
HGB: 14.7 g/dL (ref 13.0–18.0)
MCH: 29.9 pg (ref 26.0–34.0)
MCHC: 33.9 g/dL (ref 32.0–36.0)
MCV: 88 fL (ref 80–100)
RDW: 16.5 % — ABNORMAL HIGH (ref 11.5–14.5)
WBC: 5.4 10*3/uL (ref 3.8–10.6)

## 2013-05-14 LAB — URINALYSIS, COMPLETE
Blood: NEGATIVE
Hyaline Cast: 2
Ketone: NEGATIVE
Leukocyte Esterase: NEGATIVE
Nitrite: NEGATIVE
Ph: 5 (ref 4.5–8.0)
RBC,UR: NONE SEEN /HPF (ref 0–5)
Specific Gravity: 1.017 (ref 1.003–1.030)
WBC UR: 1 /HPF (ref 0–5)

## 2013-06-18 ENCOUNTER — Emergency Department (HOSPITAL_COMMUNITY): Payer: Worker's Compensation

## 2013-06-18 ENCOUNTER — Emergency Department (HOSPITAL_COMMUNITY)
Admission: EM | Admit: 2013-06-18 | Discharge: 2013-06-18 | Disposition: A | Discharge status: Home / Self Care | Payer: Worker's Compensation | Attending: Emergency Medicine | Admitting: Emergency Medicine

## 2013-06-18 ENCOUNTER — Encounter (HOSPITAL_COMMUNITY): Payer: Self-pay | Admitting: Emergency Medicine

## 2013-06-18 DIAGNOSIS — Z880 Allergy status to penicillin: Secondary | ICD-10-CM | POA: Insufficient documentation

## 2013-06-18 DIAGNOSIS — Y9389 Activity, other specified: Secondary | ICD-10-CM | POA: Insufficient documentation

## 2013-06-18 DIAGNOSIS — F17200 Nicotine dependence, unspecified, uncomplicated: Secondary | ICD-10-CM | POA: Insufficient documentation

## 2013-06-18 DIAGNOSIS — W19XXXA Unspecified fall, initial encounter: Secondary | ICD-10-CM

## 2013-06-18 DIAGNOSIS — Z8781 Personal history of (healed) traumatic fracture: Secondary | ICD-10-CM | POA: Insufficient documentation

## 2013-06-18 DIAGNOSIS — Z789 Other specified health status: Secondary | ICD-10-CM | POA: Insufficient documentation

## 2013-06-18 DIAGNOSIS — M545 Low back pain: Secondary | ICD-10-CM

## 2013-06-18 DIAGNOSIS — K219 Gastro-esophageal reflux disease without esophagitis: Secondary | ICD-10-CM | POA: Insufficient documentation

## 2013-06-18 DIAGNOSIS — Y929 Unspecified place or not applicable: Secondary | ICD-10-CM | POA: Insufficient documentation

## 2013-06-18 DIAGNOSIS — IMO0002 Reserved for concepts with insufficient information to code with codable children: Secondary | ICD-10-CM | POA: Insufficient documentation

## 2013-06-18 DIAGNOSIS — Z79899 Other long term (current) drug therapy: Secondary | ICD-10-CM | POA: Insufficient documentation

## 2013-06-18 DIAGNOSIS — W010XXA Fall on same level from slipping, tripping and stumbling without subsequent striking against object, initial encounter: Secondary | ICD-10-CM | POA: Insufficient documentation

## 2013-06-18 HISTORY — DX: Other intervertebral disc degeneration, lumbar region without mention of lumbar back pain or lower extremity pain: M51.369

## 2013-06-18 HISTORY — DX: Collapsed vertebra, not elsewhere classified, site unspecified, initial encounter for fracture: M48.50XA

## 2013-06-18 HISTORY — DX: Personal history of other medical treatment: Z92.89

## 2013-06-18 HISTORY — DX: Other intervertebral disc degeneration, lumbar region: M51.36

## 2013-06-18 MED ORDER — METHOCARBAMOL 500 MG PO TABS: 1000.0000 mg | ORAL_TABLET | Freq: Four times a day (QID) | ORAL | Status: DC | PRN

## 2013-06-18 MED ORDER — HYDROCODONE-ACETAMINOPHEN 5-325 MG PO TABS: ORAL_TABLET | ORAL | Status: DC

## 2013-06-18 MED ORDER — FENTANYL CITRATE 0.05 MG/ML IJ SOLN
50.0000 ug | INTRAMUSCULAR | Status: DC | PRN
  Administered 2013-06-18: 50 ug via INTRAVENOUS
  Filled 2013-06-18: qty 2

## 2013-06-18 NOTE — ED Notes (Signed)
Patient presents to ED via [facility]. Patient was cleaning and slipped on a slick surface-landing directly on his "tailbone" when he fell to the ground from a standing position. Pt states that a few coworkers helped him over to a bench. Tender upon palpation from mid back to tailbone. Patient is able to move all extremities, pulses intact. C-collar in place and aligned. Pt currently on long spine board. A&Ox4. 20G to left AC placed by EMS- pt given of Fentanyl in route by EMS. Rating pain 6/10.

## 2013-06-18 NOTE — ED Notes (Signed)
Pt returned from radiology.

## 2013-06-18 NOTE — ED Provider Notes (Signed)
CSN: [id number]     Arrival date & time 06/18/13  0706 History   First MD Initiated Contact with Patient 06/18/13 [phone]     Chief Complaint  Patient presents with  . Fall    HPI Pt was seen at 0720. Per EMS and pt report, c/o sudden onset and resolution of one episode of slip and fall that occurred PTA. Pt states he was cleaning and slipped on a wet surface, falling onto his buttocks. Pt was able to stand up and walk after the fall. Pt c/o mid to low back pain. Pain worsens with palpation of the area and body position changes. Describes the pain as "muscle spasms." Denies LOC, no AMS, no incont/retention of bowel or bladder, no saddle anesthesia, no focal motor weakness, no tingling/numbness in extremities, no abd pain, no CP/SOB, no prodromal symptoms before fall.     Past Medical History  Diagnosis Date  . Gastroenteritis   . Acid reflux   . Vertebral compression fracture     T11, L1  . History of stress test 2001    no ischemia  . DDD (degenerative disc disease), lumbar    Past Surgical History  Procedure Laterality Date  . Exploratory laparotomy w/ bowel resection    . Abdominal surgery     Family History  Problem Relation Age of Onset  . Diabetes Mother   . Hypertension Mother   . Stroke Father   . Heart failure Father   . Heart failure Other    History  Substance Use Topics  . Smoking status: Current Every Day Smoker -- 0.50 packs/day for 20 years    Types: Cigarettes  . Smokeless tobacco: Not on file  . Alcohol Use: 19.3 oz/week    4 Glasses of wine, 24 Cans of beer, 5 Drinks containing 0.5 oz of alcohol per week     Comment: Everyday drinker    Review of Systems ROS: Statement: All systems negative except as marked or noted in the HPI; Constitutional: Negative for fever and chills. ; ; Eyes: Negative for eye pain, redness and discharge. ; ; ENMT: Negative for ear pain, hoarseness, nasal congestion, sinus pressure and sore throat. ; ; Cardiovascular: Negative for  chest pain, palpitations, diaphoresis, dyspnea and peripheral edema. ; ; Respiratory: Negative for cough, wheezing and stridor. ; ; Gastrointestinal: Negative for nausea, vomiting, diarrhea, abdominal pain, blood in stool, hematemesis, jaundice and rectal bleeding. . ; ; Genitourinary: Negative for dysuria, flank pain and hematuria. ; ; Musculoskeletal: +back pain. Negative for neck pain. Negative for swelling and trauma.; ; Skin: Negative for pruritus, rash, abrasions, blisters, bruising and skin lesion.; ; Neuro: Negative for headache, lightheadedness and neck stiffness. Negative for weakness, altered level of consciousness , altered mental status, extremity weakness, paresthesias, involuntary movement, seizure and syncope.     Allergies  Penicillins  Home Medications   Current Outpatient Rx  Name  Route  Sig  Dispense  Refill  . lansoprazole (PREVACID) 15 MG capsule   Oral   Take 15 mg by mouth daily.          BP 127/82  Pulse 103  Temp(Src) 98.5 F (36.9 C) (Oral)  Resp 19  Ht 5\' 11"  (1.803 m)  Wt 225 lb (102.059 kg)  BMI 31.39 kg/m2  SpO2 95% Physical Exam 0725: Physical examination: Vital signs and O2 SAT: Reviewed; Constitutional: Well developed, Well nourished, Well hydrated, In no acute distress; Head and Face: Normocephalic, Atraumatic; Eyes: EOMI, PERRL, No scleral icterus; ENMT:  Mouth and pharynx normal, Left TM normal, Right TM normal, Mucous membranes moist; Neck: Immobilized in C-collar, Trachea midline; Spine: Immobilized on spineboard, No midline CS, TS, LS tenderness. +TTP right lower thoracic and lumbar paraspinal muscles. Sacrum NT. No ecchymosis or abrasions.; Cardiovascular: Regular rate and rhythm, No gallop; Respiratory: Breath sounds clear & equal bilaterally, No rales, rhonchi, wheezes, Normal respiratory effort/excursion; Chest: Nontender, No deformity, Movement normal, No crepitus, No abrasions or ecchymosis.; Abdomen: Soft, Nontender, Nondistended, Normal  bowel sounds, No abrasions or ecchymosis.; Genitourinary: No CVA tenderness;; Extremities: No deformity, Full range of motion major/large joints of bilat UE's and LE's without pain or tenderness to palp, Neurovascularly intact, Pulses normal, No tenderness, No edema, Pelvis stable; Neuro: AA&Ox3, GCS 15.  Major CN grossly intact. Speech clear. No facial droop. Equal grips. Strength 5/5 equal bilat UE's and LE's.  DTR 2/4 equal bilat UE's and LE's.  No gross sensory deficits. No gross focal motor or sensory deficits in extremities.; Skin: Color normal, Warm, Dry   ED Course  Procedures   0725:  Pt arrived to ED with LSB and c-collar in place.  Multiple ED staff at bedside to log roll pt off LSB while maintaining cervical spinal immobilization.  LSB removed, c-collar remains in place. Imaging studies ordered.   0830:  No midline CS tenderness, FROM CS without midline tenderness. No NMS changes.  C-collar removed.  Pt states he is ready to go home now. No acute fx on XR. Neuro exam remains intact. Will tx symptomatically at this time. Dx and testing d/w pt. Questions answered.  Verb understanding, agreeable to d/c home with outpt f/u.     EKG Interpretation   None       MDM  MDM Reviewed: previous chart, nursing note and vitals Reviewed previous: CT scan and x-ray Interpretation: x-ray and CT scan    Dg Thoracic Spine 2 View 06/18/2013   CLINICAL DATA:  Back pain secondary to a fall.  EXAM: THORACIC SPINE - 2 VIEW  COMPARISON:  Chest x-rays dated 04/06/2010 and 10/18/2009  FINDINGS: There is no acute fracture or bone destruction. No subluxation. Slight old anterior wedge deformities of T7 and T8 which may developmental.  IMPRESSION: No acute abnormality of the thoracic spine.   Electronically Signed   By: Geanie Cooley M.D.   On: 06/18/2013 08:10   Dg Lumbar Spine Complete 06/18/2013   CLINICAL DATA:  Fall, low back pain  EXAM: LUMBAR SPINE - COMPLETE 4+ VIEW  COMPARISON:  08/19/2011   FINDINGS: S1 transitional vertebral body noted. Normal alignment. No definite fracture. Preserved vertebral body heights. Stable exam compared to 08/19/2011. Minor degenerative changes. Normal SI joints. Postop changes in the abdomen.  IMPRESSION: Stable exam.  No acute process.   Electronically Signed   By: Ruel Favors M.D.   On: 06/18/2013 08:11   Ct Head Wo Contrast 06/18/2013   CLINICAL DATA:  Trauma secondary to a fall.  EXAM: CT HEAD WITHOUT CONTRAST  TECHNIQUE: Contiguous axial images were obtained from the base of the skull through the vertex without intravenous contrast.  COMPARISON:  CT scan dated 12/07/2008  FINDINGS: No mass lesion. No midline shift. No acute hemorrhage or hematoma. No extra-axial fluid collections. No evidence of acute infarction. Brain parenchyma appears normal. No osseous abnormality.  IMPRESSION: Normal exam.   Electronically Signed   By: Geanie Cooley M.D.   On: 06/18/2013 08:24   Ct Cervical Spine Wo Contrast 06/18/2013   CLINICAL DATA:  Trauma secondary to  a fall.  EXAM: CT CERVICAL SPINE WITHOUT CONTRAST  TECHNIQUE: Multidetector CT imaging of the cervical spine was performed without intravenous contrast. Multiplanar CT image reconstructions were also generated.  COMPARISON:  None.  FINDINGS: There is no fracture, subluxation, or prevertebral soft tissue swelling. No disc space narrowing or facet arthritis. Small calcifications in the anterior aspects of the discs at C5-6 and C6-7.  IMPRESSION: No significant abnormality of the cervical spine.   Electronically Signed   By: Geanie Cooley M.D.   On: 06/18/2013 08:27        Laray Anger, DO 06/21/13 [phone]

## 2013-06-18 NOTE — ED Notes (Signed)
Transported to xray 

## 2013-07-08 LAB — HM COLONOSCOPY

## 2014-01-20 ENCOUNTER — Emergency Department: Payer: Self-pay | Admitting: Emergency Medicine

## 2014-01-20 LAB — COMPREHENSIVE METABOLIC PANEL
ANION GAP: 9 (ref 7–16)
Albumin: 3.6 g/dL (ref 3.4–5.0)
Alkaline Phosphatase: 65 U/L
BUN: 7 mg/dL (ref 7–18)
Bilirubin,Total: 0.6 mg/dL (ref 0.2–1.0)
CALCIUM: 8.2 mg/dL — AB (ref 8.5–10.1)
CREATININE: 0.88 mg/dL (ref 0.60–1.30)
Chloride: 107 mmol/L (ref 98–107)
Co2: 23 mmol/L (ref 21–32)
EGFR (African American): >60
GLUCOSE: 93 mg/dL (ref 65–99)
Osmolality: 275 (ref 275–301)
Potassium: 3.6 mmol/L (ref 3.5–5.1)
SGOT(AST): 79 U/L — ABNORMAL HIGH (ref 15–37)
SGPT (ALT): 91 U/L — ABNORMAL HIGH (ref 12–78)
SODIUM: 139 mmol/L (ref 136–145)
Total Protein: 7.1 g/dL (ref 6.4–8.2)

## 2014-01-20 LAB — CBC
HCT: 48.5 % (ref 40.0–52.0)
HGB: 16.1 g/dL (ref 13.0–18.0)
MCH: 30.3 pg (ref 26.0–34.0)
MCHC: 33.1 g/dL (ref 32.0–36.0)
MCV: 92 fL (ref 80–100)
Platelet: 150 10*3/uL (ref 150–440)
RBC: 5.3 10*6/uL (ref 4.40–5.90)
RDW: 16.6 % — ABNORMAL HIGH (ref 11.5–14.5)
WBC: 10.5 10*3/uL (ref 3.8–10.6)

## 2014-01-20 LAB — LIPASE, BLOOD: LIPASE: 55 U/L — AB (ref 73–393)

## 2014-02-24 ENCOUNTER — Emergency Department: Payer: Self-pay | Admitting: Emergency Medicine

## 2014-02-25 LAB — URINALYSIS, COMPLETE
Bacteria: NONE SEEN
Bilirubin,UR: NEGATIVE
Blood: NEGATIVE
Glucose,UR: NEGATIVE mg/dL (ref 0–75)
LEUKOCYTE ESTERASE: NEGATIVE
Nitrite: NEGATIVE
PH: 5 (ref 4.5–8.0)
Protein: >=500
SPECIFIC GRAVITY: 1.023 (ref 1.003–1.030)
WBC UR: 6 /HPF (ref 0–5)

## 2014-02-25 LAB — CBC WITH DIFFERENTIAL/PLATELET
Basophil #: 0.1 10*3/uL (ref 0.0–0.1)
Basophil %: 0.8 %
EOS ABS: 3.6 10*3/uL — AB (ref 0.0–0.7)
EOS PCT: 39.9 %
HCT: 48.4 % (ref 40.0–52.0)
HGB: 15.7 g/dL (ref 13.0–18.0)
LYMPHS ABS: 1.6 10*3/uL (ref 1.0–3.6)
Lymphocyte %: 18 %
MCH: 30.1 pg (ref 26.0–34.0)
MCHC: 32.4 g/dL (ref 32.0–36.0)
MCV: 93 fL (ref 80–100)
MONOS PCT: 6.1 %
Monocyte #: 0.6 x10 3/mm (ref 0.2–1.0)
NEUTROS PCT: 35.2 %
Neutrophil #: 3.2 10*3/uL (ref 1.4–6.5)
Platelet: 172 10*3/uL (ref 150–440)
RBC: 5.21 10*6/uL (ref 4.40–5.90)
RDW: 16.2 % — ABNORMAL HIGH (ref 11.5–14.5)
WBC: 9.1 10*3/uL (ref 3.8–10.6)

## 2014-02-25 LAB — COMPREHENSIVE METABOLIC PANEL
ALBUMIN: 3.9 g/dL (ref 3.4–5.0)
ALK PHOS: 68 U/L
ANION GAP: 13 (ref 7–16)
BUN: 7 mg/dL (ref 7–18)
Bilirubin,Total: 0.5 mg/dL (ref 0.2–1.0)
CO2: 24 mmol/L (ref 21–32)
Calcium, Total: 8.8 mg/dL (ref 8.5–10.1)
Chloride: 102 mmol/L (ref 98–107)
Creatinine: 0.9 mg/dL (ref 0.60–1.30)
EGFR (African American): >60
EGFR (Non-African Amer.): >60
GLUCOSE: 88 mg/dL (ref 65–99)
Osmolality: 275 (ref 275–301)
POTASSIUM: 4.3 mmol/L (ref 3.5–5.1)
SGOT(AST): 101 U/L — ABNORMAL HIGH (ref 15–37)
SGPT (ALT): 131 U/L — ABNORMAL HIGH
Sodium: 139 mmol/L (ref 136–145)
TOTAL PROTEIN: 7.7 g/dL (ref 6.4–8.2)

## 2014-02-25 LAB — LIPASE, BLOOD: LIPASE: 83 U/L (ref 73–393)

## 2014-04-16 ENCOUNTER — Emergency Department: Payer: Self-pay | Admitting: Emergency Medicine

## 2014-04-16 LAB — COMPREHENSIVE METABOLIC PANEL
ALBUMIN: 4.2 g/dL (ref 3.4–5.0)
AST: 43 U/L — AB (ref 15–37)
Alkaline Phosphatase: 81 U/L
Anion Gap: 12 (ref 7–16)
BUN: 10 mg/dL (ref 7–18)
Bilirubin,Total: 0.8 mg/dL (ref 0.2–1.0)
Calcium, Total: 9 mg/dL (ref 8.5–10.1)
Chloride: 103 mmol/L (ref 98–107)
Co2: 21 mmol/L (ref 21–32)
Creatinine: 0.87 mg/dL (ref 0.60–1.30)
Glucose: 87 mg/dL (ref 65–99)
Osmolality: 270 (ref 275–301)
Potassium: 3.8 mmol/L (ref 3.5–5.1)
SGPT (ALT): 53 U/L
SODIUM: 136 mmol/L (ref 136–145)
TOTAL PROTEIN: 7.6 g/dL (ref 6.4–8.2)

## 2014-04-16 LAB — CBC WITH DIFFERENTIAL/PLATELET
Basophil: 1 %
Eosinophil: 54 %
HCT: 48.9 % (ref 40.0–52.0)
HGB: 16.3 g/dL (ref 13.0–18.0)
LYMPHS PCT: 10 %
MCH: 30.8 pg (ref 26.0–34.0)
MCHC: 33.4 g/dL (ref 32.0–36.0)
MCV: 92 fL (ref 80–100)
MONOS PCT: 2 %
PLATELETS: 196 10*3/uL (ref 150–440)
RBC: 5.31 10*6/uL (ref 4.40–5.90)
RDW: 15.7 % — AB (ref 11.5–14.5)
SEGMENTED NEUTROPHILS: 33 %
WBC: 20.5 10*3/uL — ABNORMAL HIGH (ref 3.8–10.6)

## 2014-04-16 LAB — LIPASE, BLOOD: Lipase: 75 U/L (ref 73–393)

## 2014-06-16 ENCOUNTER — Emergency Department: Payer: Self-pay | Admitting: Emergency Medicine

## 2014-09-08 ENCOUNTER — Emergency Department: Payer: Self-pay | Admitting: Emergency Medicine

## 2014-09-17 ENCOUNTER — Emergency Department: Payer: Self-pay | Admitting: Emergency Medicine

## 2014-10-08 ENCOUNTER — Emergency Department
Admit: 2014-10-08 | Disposition: A | Discharge status: Home / Self Care | Payer: Self-pay | Admitting: Emergency Medicine

## 2014-10-15 ENCOUNTER — Inpatient Hospital Stay (HOSPITAL_COMMUNITY)
Admission: EM | Admit: 2014-10-15 | Discharge: 2014-10-18 | DRG: 872 | Disposition: A | Discharge status: Home / Self Care | Payer: Commercial Managed Care - PPO | Attending: Internal Medicine | Admitting: Internal Medicine

## 2014-10-15 ENCOUNTER — Encounter (HOSPITAL_COMMUNITY): Payer: Self-pay

## 2014-10-15 ENCOUNTER — Emergency Department (HOSPITAL_COMMUNITY): Payer: Commercial Managed Care - PPO

## 2014-10-15 DIAGNOSIS — K2100 Gastro-esophageal reflux disease with esophagitis, without bleeding: Secondary | ICD-10-CM

## 2014-10-15 DIAGNOSIS — R05 Cough: Secondary | ICD-10-CM | POA: Diagnosis present

## 2014-10-15 DIAGNOSIS — R0902 Hypoxemia: Secondary | ICD-10-CM

## 2014-10-15 DIAGNOSIS — J111 Influenza due to unidentified influenza virus with other respiratory manifestations: Secondary | ICD-10-CM | POA: Diagnosis present

## 2014-10-15 DIAGNOSIS — I10 Essential (primary) hypertension: Secondary | ICD-10-CM

## 2014-10-15 DIAGNOSIS — R509 Fever, unspecified: Secondary | ICD-10-CM | POA: Diagnosis not present

## 2014-10-15 DIAGNOSIS — J101 Influenza due to other identified influenza virus with other respiratory manifestations: Secondary | ICD-10-CM | POA: Diagnosis present

## 2014-10-15 DIAGNOSIS — A419 Sepsis, unspecified organism: Secondary | ICD-10-CM | POA: Diagnosis not present

## 2014-10-15 DIAGNOSIS — R059 Cough, unspecified: Secondary | ICD-10-CM | POA: Diagnosis present

## 2014-10-15 DIAGNOSIS — R079 Chest pain, unspecified: Secondary | ICD-10-CM

## 2014-10-15 DIAGNOSIS — R112 Nausea with vomiting, unspecified: Secondary | ICD-10-CM | POA: Diagnosis present

## 2014-10-15 DIAGNOSIS — Z720 Tobacco use: Secondary | ICD-10-CM | POA: Diagnosis present

## 2014-10-15 DIAGNOSIS — I452 Bifascicular block: Secondary | ICD-10-CM | POA: Diagnosis present

## 2014-10-15 DIAGNOSIS — Z880 Allergy status to penicillin: Secondary | ICD-10-CM

## 2014-10-15 DIAGNOSIS — D696 Thrombocytopenia, unspecified: Secondary | ICD-10-CM | POA: Diagnosis present

## 2014-10-15 DIAGNOSIS — F1010 Alcohol abuse, uncomplicated: Secondary | ICD-10-CM | POA: Diagnosis present

## 2014-10-15 DIAGNOSIS — M5136 Other intervertebral disc degeneration, lumbar region: Secondary | ICD-10-CM

## 2014-10-15 DIAGNOSIS — B349 Viral infection, unspecified: Secondary | ICD-10-CM

## 2014-10-15 DIAGNOSIS — F17210 Nicotine dependence, cigarettes, uncomplicated: Secondary | ICD-10-CM | POA: Diagnosis present

## 2014-10-15 DIAGNOSIS — R000 Tachycardia, unspecified: Secondary | ICD-10-CM

## 2014-10-15 DIAGNOSIS — R0602 Shortness of breath: Secondary | ICD-10-CM | POA: Diagnosis present

## 2014-10-15 DIAGNOSIS — K219 Gastro-esophageal reflux disease without esophagitis: Secondary | ICD-10-CM | POA: Diagnosis present

## 2014-10-15 HISTORY — DX: Tobacco use: Z72.0

## 2014-10-15 HISTORY — DX: Alcohol abuse, uncomplicated: F10.10

## 2014-10-15 HISTORY — DX: Essential (primary) hypertension: I10

## 2014-10-15 HISTORY — DX: Unspecified osteoarthritis, unspecified site: M19.90

## 2014-10-15 LAB — CBC WITH DIFFERENTIAL/PLATELET
BASOS PCT: 0 % (ref 0–1)
Basophils Absolute: 0 10*3/uL (ref 0.0–0.1)
EOS ABS: 1.8 10*3/uL — AB (ref 0.0–0.7)
Eosinophils Relative: 27 % — ABNORMAL HIGH (ref 0–5)
HEMATOCRIT: 42.8 % (ref 39.0–52.0)
HEMOGLOBIN: 14.2 g/dL (ref 13.0–17.0)
Lymphocytes Relative: 9 % — ABNORMAL LOW (ref 12–46)
Lymphs Abs: 0.6 10*3/uL — ABNORMAL LOW (ref 0.7–4.0)
MCH: 29.6 pg (ref 26.0–34.0)
MCHC: 33.2 g/dL (ref 30.0–36.0)
MCV: 89.2 fL (ref 78.0–100.0)
MONO ABS: 0.5 10*3/uL (ref 0.1–1.0)
MONOS PCT: 7 % (ref 3–12)
NEUTROS ABS: 3.6 10*3/uL (ref 1.7–7.7)
NEUTROS PCT: 57 % (ref 43–77)
PLATELETS: 131 10*3/uL — AB (ref 150–400)
RBC: 4.8 MIL/uL (ref 4.22–5.81)
RDW: 15.3 % (ref 11.5–15.5)
WBC: 6.5 10*3/uL (ref 4.0–10.5)

## 2014-10-15 LAB — I-STAT TROPONIN, ED: Troponin i, poc: 0 ng/mL (ref 0.00–0.08)

## 2014-10-15 LAB — I-STAT CG4 LACTIC ACID, ED: LACTIC ACID, VENOUS: 3.13 mmol/L — AB (ref 0.5–2.0)

## 2014-10-15 MED ORDER — ACETAMINOPHEN 500 MG PO TABS
1000.0000 mg | ORAL_TABLET | Freq: Once | ORAL | Status: AC
  Administered 2014-10-15: 1000 mg via ORAL
  Filled 2014-10-15: qty 2

## 2014-10-15 MED ORDER — SODIUM CHLORIDE 0.9 % IV BOLUS (SEPSIS)
1000.0000 mL | Freq: Once | INTRAVENOUS | Status: AC
Start: 2014-10-15 — End: 2014-10-16
  Administered 2014-10-15: 1000 mL via INTRAVENOUS

## 2014-10-15 NOTE — ED Notes (Signed)
Per [facility]: Pt had sudden onset of chest pain and tightness, radiating into left neck and left back , with nausea and vomiting - started about 8:30 pm -   From [facility] - Pt has had 324 of ASA, 1 Nitro, 4 mg Zofran - now no pain, no tightness, no nausea   Remained tachy, lung sounds are clear

## 2014-10-15 NOTE — ED Provider Notes (Signed)
CSN: [id number]     Arrival date & time 10/15/14  2231 History   First MD Initiated Contact with Patient 10/15/14 2243     Chief Complaint  Patient presents with  . Chest Pain     (Consider location/radiation/quality/duration/timing/severity/associated sxs/prior Treatment) HPI   [name] is a(n) 59 y.o. male who presents to the ED with cc coughing and posttussive vomiting. Patient states that around 8:30 PM the patient developed fever, myalgias, paroxysms of cough with associated production of clear phlegm, several episodes of post tussive vomiting. Patient states that he felt short of breath. He had chest tightness with cough but denies chest pain. He did feel some tightness and pain in the left shoulder and left neck. The patient is a chronic daily smoker. He denies any known cardiac history. She was recently treated with an antibiotic for sinus congestion. Patient took an aspirin and nitroglycerin prior to arrival.  Past Medical History  Diagnosis Date  . Gastroenteritis   . Acid reflux   . Vertebral compression fracture     T11, L1  . History of stress test 2001    no ischemia  . DDD (degenerative disc disease), lumbar    Past Surgical History  Procedure Laterality Date  . Exploratory laparotomy w/ bowel resection    . Abdominal surgery     Family History  Problem Relation Age of Onset  . Diabetes Mother   . Hypertension Mother   . Stroke Father   . Heart failure Father   . Heart failure Other    History  Substance Use Topics  . Smoking status: Current Every Day Smoker -- 0.50 packs/day for 20 years    Types: Cigarettes  . Smokeless tobacco: Not on file  . Alcohol Use: 19.3 oz/week    4 Glasses of wine, 24 Cans of beer, 5 Standard drinks or equivalent per week     Comment: Everyday drinker    Review of Systems  Ten systems reviewed and are negative for acute change, except as noted in the HPI.    Allergies  Penicillins  Home Medications    Prior to Admission medications   Medication Sig Start Date End Date Taking? Authorizing Provider  HYDROcodone-acetaminophen (NORCO/VICODIN) 5-325 MG per tablet 1 or 2 tabs PO q6 hours prn pain 06/18/13   Francine Graven, DO  methocarbamol (ROBAXIN) 500 MG tablet Take 2 tablets (1,000 mg total) by mouth 4 (four) times daily as needed for muscle spasms (muscle spasm/pain). 06/18/13   Francine Graven, DO  omeprazole (PRILOSEC) 20 MG capsule Take 20 mg by mouth daily.    Historical Provider, MD  predniSONE (DELTASONE) 5 MG tablet Take 5 mg by mouth daily with breakfast.    Historical Provider, MD   BP 120/53 mmHg  Pulse 126  Temp(Src) 100 F (37.8 C) (Oral)  Resp 26  SpO2 97% Physical Exam  Constitutional: He appears well-developed and well-nourished. No distress.  HENT:  Head: Normocephalic and atraumatic.  Eyes: Conjunctivae are normal. No scleral icterus.  Neck: Normal range of motion. Neck supple.  Cardiovascular: Normal rate, regular rhythm and normal heart sounds.   Pulmonary/Chest: Effort normal and breath sounds normal. No respiratory distress. He has no wheezes. He has no rales. He exhibits no tenderness.  unlabored  Abdominal: Soft. There is no tenderness.  Musculoskeletal: He exhibits no edema.  Neurological: He is alert.  Skin: Skin is warm and dry. He is not diaphoretic.  Psychiatric: His behavior is normal.  Nursing note and  vitals reviewed.   ED Course  Procedures (including critical care time) Labs Review Labs Reviewed  CBC WITH DIFFERENTIAL/PLATELET  COMPREHENSIVE METABOLIC PANEL  I-STAT [place], ED    Imaging Review No results found.   EKG Interpretation   Date/Time:  Saturday October 15 2014 22:38:10 EDT Ventricular Rate:  124 PR Interval:  119 QRS Duration: 141 QT Interval:  334 QTC Calculation: 480 R Axis:   -103 Text Interpretation:  Sinus tachycardia RBBB and LAFB now with RBBB  otherwise no significant change Confirmed by HARRISON  MD,  FORREST ([id number])  on 10/15/2014 10:50:02 PM      MDM   Final diagnoses:  Chest pain    11:04 PM Patient Denies nausea which is in opposition to nursing intake note.  Vomiting was post tussive. Oxygen saturations > 90 % on RA Patient with elevated lactate likely due to dehydration, No leukocytosis. ekg unremarkable. Tachycardic. Will obtain flu swab and CTA to f/o PE> I have given report to Yell who will assume care.  Margarita Mail, [name] 10/22/14 Laura, MD 10/23/14 [phone]

## 2014-10-16 ENCOUNTER — Other Ambulatory Visit (HOSPITAL_COMMUNITY): Payer: Self-pay

## 2014-10-16 ENCOUNTER — Emergency Department (HOSPITAL_COMMUNITY): Payer: Commercial Managed Care - PPO

## 2014-10-16 ENCOUNTER — Encounter (HOSPITAL_COMMUNITY): Payer: Self-pay

## 2014-10-16 DIAGNOSIS — K210 Gastro-esophageal reflux disease with esophagitis: Secondary | ICD-10-CM | POA: Diagnosis not present

## 2014-10-16 DIAGNOSIS — K219 Gastro-esophageal reflux disease without esophagitis: Secondary | ICD-10-CM | POA: Diagnosis present

## 2014-10-16 DIAGNOSIS — R0902 Hypoxemia: Secondary | ICD-10-CM | POA: Diagnosis present

## 2014-10-16 DIAGNOSIS — I452 Bifascicular block: Secondary | ICD-10-CM | POA: Diagnosis present

## 2014-10-16 DIAGNOSIS — J111 Influenza due to unidentified influenza virus with other respiratory manifestations: Secondary | ICD-10-CM | POA: Diagnosis not present

## 2014-10-16 DIAGNOSIS — J101 Influenza due to other identified influenza virus with other respiratory manifestations: Secondary | ICD-10-CM | POA: Diagnosis present

## 2014-10-16 DIAGNOSIS — R059 Cough, unspecified: Secondary | ICD-10-CM | POA: Diagnosis present

## 2014-10-16 DIAGNOSIS — M5136 Other intervertebral disc degeneration, lumbar region: Secondary | ICD-10-CM | POA: Diagnosis present

## 2014-10-16 DIAGNOSIS — A419 Sepsis, unspecified organism: Secondary | ICD-10-CM | POA: Diagnosis present

## 2014-10-16 DIAGNOSIS — Z720 Tobacco use: Secondary | ICD-10-CM | POA: Diagnosis present

## 2014-10-16 DIAGNOSIS — F17210 Nicotine dependence, cigarettes, uncomplicated: Secondary | ICD-10-CM | POA: Diagnosis present

## 2014-10-16 DIAGNOSIS — R112 Nausea with vomiting, unspecified: Secondary | ICD-10-CM | POA: Diagnosis present

## 2014-10-16 DIAGNOSIS — I10 Essential (primary) hypertension: Secondary | ICD-10-CM | POA: Insufficient documentation

## 2014-10-16 DIAGNOSIS — R0602 Shortness of breath: Secondary | ICD-10-CM | POA: Diagnosis present

## 2014-10-16 DIAGNOSIS — R509 Fever, unspecified: Secondary | ICD-10-CM | POA: Diagnosis present

## 2014-10-16 DIAGNOSIS — R05 Cough: Secondary | ICD-10-CM | POA: Diagnosis not present

## 2014-10-16 DIAGNOSIS — F1010 Alcohol abuse, uncomplicated: Secondary | ICD-10-CM | POA: Diagnosis present

## 2014-10-16 DIAGNOSIS — D696 Thrombocytopenia, unspecified: Secondary | ICD-10-CM | POA: Diagnosis present

## 2014-10-16 DIAGNOSIS — Z880 Allergy status to penicillin: Secondary | ICD-10-CM | POA: Diagnosis not present

## 2014-10-16 LAB — COMPREHENSIVE METABOLIC PANEL
ALK PHOS: 55 U/L (ref 39–117)
ALT: 53 U/L (ref 0–53)
ANION GAP: 14 (ref 5–15)
AST: 64 U/L — ABNORMAL HIGH (ref 0–37)
Albumin: 3.8 g/dL (ref 3.5–5.2)
CALCIUM: 8.4 mg/dL (ref 8.4–10.5)
CO2: 21 mmol/L (ref 19–32)
CREATININE: 0.79 mg/dL (ref 0.50–1.35)
Chloride: 106 mmol/L (ref 96–112)
GFR calc Af Amer: >90 mL/min (ref >90)
GLUCOSE: 91 mg/dL (ref 70–99)
POTASSIUM: 3.7 mmol/L (ref 3.5–5.1)
SODIUM: 141 mmol/L (ref 135–145)
Total Bilirubin: 0.5 mg/dL (ref 0.3–1.2)
Total Protein: 6.5 g/dL (ref 6.0–8.3)

## 2014-10-16 LAB — EXPECTORATED SPUTUM ASSESSMENT W REFEX TO RESP CULTURE

## 2014-10-16 LAB — RAPID URINE DRUG SCREEN, HOSP PERFORMED
AMPHETAMINES: NOT DETECTED
Barbiturates: NOT DETECTED
Benzodiazepines: NOT DETECTED
Cocaine: NOT DETECTED
OPIATES: POSITIVE — AB
Tetrahydrocannabinol: NOT DETECTED

## 2014-10-16 LAB — URINALYSIS, ROUTINE W REFLEX MICROSCOPIC
Bilirubin Urine: NEGATIVE
Glucose, UA: NEGATIVE mg/dL
HGB URINE DIPSTICK: NEGATIVE
KETONES UR: NEGATIVE mg/dL
Leukocytes, UA: NEGATIVE
Nitrite: NEGATIVE
PH: 5.5 (ref 5.0–8.0)
Protein, ur: NEGATIVE mg/dL
Specific Gravity, Urine: 1.012 (ref 1.005–1.030)
Urobilinogen, UA: 0.2 mg/dL (ref 0.0–1.0)

## 2014-10-16 LAB — LACTIC ACID, PLASMA
Lactic Acid, Venous: 1.2 mmol/L (ref 0.5–2.0)
Lactic Acid, Venous: 1.5 mmol/L (ref 0.5–2.0)

## 2014-10-16 LAB — I-STAT TROPONIN, ED: TROPONIN I, POC: 0 ng/mL (ref 0.00–0.08)

## 2014-10-16 LAB — PROTIME-INR
INR: 0.96 (ref 0.00–1.49)
Prothrombin Time: 12.9 seconds (ref 11.6–15.2)

## 2014-10-16 LAB — LIPASE, BLOOD: Lipase: 25 U/L (ref 11–59)

## 2014-10-16 LAB — APTT: aPTT: 31 seconds (ref 24–37)

## 2014-10-16 LAB — TROPONIN I
Troponin I: <0.03 ng/mL (ref <0.031)
Troponin I: <0.03 ng/mL (ref <0.031)

## 2014-10-16 LAB — I-STAT CG4 LACTIC ACID, ED: Lactic Acid, Venous: 2.41 mmol/L (ref 0.5–2.0)

## 2014-10-16 LAB — PROCALCITONIN

## 2014-10-16 LAB — STREP PNEUMONIAE URINARY ANTIGEN: Strep Pneumo Urinary Antigen: NEGATIVE

## 2014-10-16 MED ORDER — LORAZEPAM 2 MG/ML IJ SOLN: 0.0000 mg | Freq: Four times a day (QID) | INTRAMUSCULAR | Status: DC

## 2014-10-16 MED ORDER — HEPARIN SODIUM (PORCINE) 5000 UNIT/ML IJ SOLN
5000.0000 [IU] | Freq: Three times a day (TID) | INTRAMUSCULAR | Status: DC
  Administered 2014-10-16 – 2014-10-18 (×6): 5000 [IU] via SUBCUTANEOUS
  Filled 2014-10-16 (×10): qty 1

## 2014-10-16 MED ORDER — PANTOPRAZOLE SODIUM 40 MG PO TBEC
40.0000 mg | DELAYED_RELEASE_TABLET | Freq: Every day | ORAL | Status: DC
  Administered 2014-10-16 – 2014-10-18 (×3): 40 mg via ORAL
  Filled 2014-10-16 (×3): qty 1

## 2014-10-16 MED ORDER — LORAZEPAM 2 MG/ML IJ SOLN: 1.0000 mg | Freq: Four times a day (QID) | INTRAMUSCULAR | Status: DC | PRN

## 2014-10-16 MED ORDER — SODIUM CHLORIDE 0.9 % IV SOLN
INTRAVENOUS | Status: DC
  Administered 2014-10-16 – 2014-10-18 (×3): via INTRAVENOUS

## 2014-10-16 MED ORDER — OSELTAMIVIR PHOSPHATE 75 MG PO CAPS
75.0000 mg | ORAL_CAPSULE | Freq: Two times a day (BID) | ORAL | Status: DC
  Administered 2014-10-17 – 2014-10-18 (×3): 75 mg via ORAL
  Filled 2014-10-16 (×4): qty 1

## 2014-10-16 MED ORDER — SODIUM CHLORIDE 0.9 % IV BOLUS (SEPSIS): 1000.0000 mL | Freq: Once | INTRAVENOUS | Status: DC

## 2014-10-16 MED ORDER — LEVOFLOXACIN IN D5W 750 MG/150ML IV SOLN
750.0000 mg | Freq: Once | INTRAVENOUS | Status: AC
  Administered 2014-10-16: 750 mg via INTRAVENOUS
  Filled 2014-10-16: qty 150

## 2014-10-16 MED ORDER — LORAZEPAM 2 MG/ML IJ SOLN: 0.0000 mg | Freq: Two times a day (BID) | INTRAMUSCULAR | Status: DC

## 2014-10-16 MED ORDER — OSELTAMIVIR PHOSPHATE 75 MG PO CAPS
75.0000 mg | ORAL_CAPSULE | Freq: Once | ORAL | Status: AC
  Administered 2014-10-16: 75 mg via ORAL
  Filled 2014-10-16: qty 1

## 2014-10-16 MED ORDER — SODIUM CHLORIDE 0.9 % IV BOLUS (SEPSIS)
1000.0000 mL | Freq: Once | INTRAVENOUS | Status: AC
  Administered 2014-10-16: 1000 mL via INTRAVENOUS

## 2014-10-16 MED ORDER — IBUPROFEN 200 MG PO TABS
400.0000 mg | ORAL_TABLET | Freq: Four times a day (QID) | ORAL | Status: DC | PRN
  Administered 2014-10-16 – 2014-10-17 (×4): 400 mg via ORAL
  Filled 2014-10-16 (×4): qty 2

## 2014-10-16 MED ORDER — ACETAMINOPHEN 500 MG PO TABS
1000.0000 mg | ORAL_TABLET | Freq: Once | ORAL | Status: AC
Start: 2014-10-16 — End: 2014-10-16
  Administered 2014-10-16: 1000 mg via ORAL
  Filled 2014-10-16: qty 2

## 2014-10-16 MED ORDER — ONDANSETRON HCL 4 MG/2ML IJ SOLN: 4.0000 mg | Freq: Three times a day (TID) | INTRAMUSCULAR | Status: DC | PRN

## 2014-10-16 MED ORDER — ALBUTEROL SULFATE (2.5 MG/3ML) 0.083% IN NEBU
2.5000 mg | INHALATION_SOLUTION | RESPIRATORY_TRACT | Status: DC | PRN
  Administered 2014-10-16 – 2014-10-17 (×5): 2.5 mg via RESPIRATORY_TRACT
  Filled 2014-10-16 (×5): qty 3

## 2014-10-16 MED ORDER — OSELTAMIVIR PHOSPHATE 75 MG PO CAPS
75.0000 mg | ORAL_CAPSULE | Freq: Every day | ORAL | Status: DC
  Administered 2014-10-16: 75 mg via ORAL
  Filled 2014-10-16: qty 1

## 2014-10-16 MED ORDER — NICOTINE 21 MG/24HR TD PT24
21.0000 mg | MEDICATED_PATCH | Freq: Every day | TRANSDERMAL | Status: DC
  Administered 2014-10-16 – 2014-10-18 (×3): 21 mg via TRANSDERMAL
  Filled 2014-10-16 (×3): qty 1

## 2014-10-16 MED ORDER — DM-GUAIFENESIN ER 30-600 MG PO TB12
1.0000 | ORAL_TABLET | Freq: Two times a day (BID) | ORAL | Status: DC
  Administered 2014-10-16 – 2014-10-18 (×5): 1 via ORAL
  Filled 2014-10-16 (×6): qty 1

## 2014-10-16 MED ORDER — LEVOFLOXACIN IN D5W 750 MG/150ML IV SOLN
750.0000 mg | INTRAVENOUS | Status: DC
  Administered 2014-10-17: 750 mg via INTRAVENOUS
  Filled 2014-10-16: qty 150

## 2014-10-16 MED ORDER — IOHEXOL 350 MG/ML SOLN
80.0000 mL | Freq: Once | INTRAVENOUS | Status: AC | PRN
  Administered 2014-10-16: 100 mL via INTRAVENOUS

## 2014-10-16 MED ORDER — VITAMIN B-1 100 MG PO TABS
100.0000 mg | ORAL_TABLET | Freq: Every day | ORAL | Status: DC
  Administered 2014-10-16 – 2014-10-18 (×3): 100 mg via ORAL
  Filled 2014-10-16 (×3): qty 1

## 2014-10-16 MED ORDER — FOLIC ACID 1 MG PO TABS
1.0000 mg | ORAL_TABLET | Freq: Every day | ORAL | Status: DC
  Administered 2014-10-16 – 2014-10-18 (×3): 1 mg via ORAL
  Filled 2014-10-16 (×3): qty 1

## 2014-10-16 MED ORDER — LORAZEPAM 1 MG PO TABS: 1.0000 mg | ORAL_TABLET | Freq: Four times a day (QID) | ORAL | Status: DC | PRN

## 2014-10-16 MED ORDER — ADULT MULTIVITAMIN W/MINERALS CH
1.0000 | ORAL_TABLET | Freq: Every day | ORAL | Status: DC
  Administered 2014-10-16 – 2014-10-18 (×3): 1 via ORAL
  Filled 2014-10-16 (×3): qty 1

## 2014-10-16 MED ORDER — THIAMINE HCL 100 MG/ML IJ SOLN
100.0000 mg | Freq: Every day | INTRAMUSCULAR | Status: DC
  Filled 2014-10-16 (×3): qty 1

## 2014-10-16 NOTE — ED Notes (Signed)
Pt c/o chest tightness and tremors again, states he feels the same as when he came in. PA notified. See new orders.

## 2014-10-16 NOTE — Progress Notes (Addendum)
ANTIBIOTIC CONSULT NOTE - INITIAL  Pharmacy Consult for Levofloxaxin Indication: Atypical pneumonia  Allergies  Allergen Reactions  . Penicillins Shortness Of Breath    Anaphilaxis       Vital Signs: Temp: 100.5 F (38.1 C) (04/10 0506) Temp Source: Oral (04/10 0506) BP: 129/69 mmHg (04/10 0639) Pulse Rate: 102 (04/10 0639) Intake/Output from previous day:   Intake/Output from this shift:    Labs:  Recent Labs  10/15/14 2247  WBC 6.5  HGB 14.2  PLT 131*  CREATININE 0.79   CrCl cannot be calculated (Unknown ideal weight.). No results for input(s): VANCOTROUGH, VANCOPEAK, VANCORANDOM, GENTTROUGH, GENTPEAK, GENTRANDOM, TOBRATROUGH, TOBRAPEAK, TOBRARND, AMIKACINPEAK, AMIKACINTROU, AMIKACIN in the last 72 hours.   Microbiology: No results found for this or any previous visit (from the past 720 hour(s)).  Medical History: Past Medical History  Diagnosis Date  . Gastroenteritis   . Acid reflux   . Vertebral compression fracture     T11, L1  . History of stress test 2001    no ischemia  . DDD (degenerative disc disease), lumbar   . Hypertension   . Tobacco abuse   . Alcohol abuse     Assessment: 59 y.o male with past medical history of hypertension, GERD, DDD, tobacco abuse, alcohol abuse, who presents with fever, runny nose, sore throat, cough, shortness of breath and vomiting. Received 1st dose of levofloxacin 750mg  IV this morning @ 06:30.   Past weight of 102.1 kg & Ht 180 cm SCr 0.79, estimated CrCl ~100 ml/min   Goal of Therapy: appropriate antibiotic dosing for patient's renal function and eradication of infection   Plan:  Levofloxacin 750 mg IV q24h Monitor clinical status, renal function, cultures results and adjust dose as needed.  Nicole Cella, RPh Clinical Pharmacist Pager: [phone] 10/16/2014,7:08 AM

## 2014-10-16 NOTE — H&P (Signed)
[facility] History and Physical  [name] OZH:[id number] DOB: Nov 20, 1955 DOA: 10/15/2014  Referring physician: ED physician PCP: Pcp Not In System  Specialists:   Chief Complaint: Fever, runny nose, sore throat, cough, shortness of breath and vomiting  HPI: [name] is a 59 y.o. male with past medical history of hypertension, GERD, DDD, tobacco abuse, alcohol abuse, who presents with fever, runny nose, sore throat, cough, shortness of breath and vomiting.  Patient reports that his symptoms started at about 8:30 last night. His symptoms include, fever, runny nose, cough, sore throat, whole-body aching, shortness of breath. He also had posttussive vomiting. He coughs up clear phlegm. He has one episode of chest tightness, but no chest pain. He did feel some tightness and pain in left shoulder and left neck.   Patient denies abdominal pain, diarrhea, constipation, dysuria, urgency, frequency, hematuria, skin rashes or leg swelling. No unilateral weakness, numbness or tingling sensations. No vision change or hearing loss.  In ED, patient was found to have elevated lactate 2.41, temperature 100.5, tachycardia, negative urinalysis, negative troponin, WBC 6.5. Chest x-ray is negative for acute abnormalities. CT angiogram of chest is negative for PE or infiltration.  Review of Systems: As presented in the history of presenting illness, rest negative.  Where does patient live?  At home Can patient participate in ADLs? Yes  Allergy:  Allergies  Allergen Reactions  . Penicillins Shortness Of Breath    Anaphilaxis    Past Medical History  Diagnosis Date  . Gastroenteritis   . Acid reflux   . Vertebral compression fracture     T11, L1  . History of stress test 2001    no ischemia  . DDD (degenerative disc disease), lumbar   . Hypertension   . Tobacco abuse   . Alcohol abuse     Past Surgical History  Procedure Laterality Date  . Exploratory  laparotomy w/ bowel resection    . Abdominal surgery      Social History:  reports that he has been smoking Cigarettes.  He has a 10 pack-year smoking history. He does not have any smokeless tobacco history on file. He reports that he drinks about 19.3 oz of alcohol per week. He reports that he does not use illicit drugs.  Family History:  Family History  Problem Relation Age of Onset  . Diabetes Mother   . Hypertension Mother   . Stroke Father   . Heart failure Father   . Heart failure Other      Prior to Admission medications   Medication Sig Start Date End Date Taking? Authorizing Provider  doxycycline (VIBRAMYCIN) 100 MG capsule Take 100 mg by mouth 2 (two) times daily.  10/09/14  Yes Historical Provider, MD  omeprazole (PRILOSEC) 20 MG capsule Take 20 mg by mouth daily.   Yes Historical Provider, MD  predniSONE (DELTASONE) 5 MG tablet Take 5 mg by mouth daily with breakfast.   Yes Historical Provider, MD  HYDROcodone-acetaminophen (NORCO/VICODIN) 5-325 MG per tablet 1 or 2 tabs PO q6 hours prn pain Patient not taking: Reported on 10/15/2014 06/18/13   Francine Graven, DO  methocarbamol (ROBAXIN) 500 MG tablet Take 2 tablets (1,000 mg total) by mouth 4 (four) times daily as needed for muscle spasms (muscle spasm/pain). Patient not taking: Reported on 10/15/2014 06/18/13   Francine Graven, DO    Physical Exam: Filed Vitals:   10/16/14 0352 10/16/14 0415 10/16/14 0445 10/16/14 0506  BP:  111/61 136/84   Pulse:  101  106   Temp: 100 F (37.8 C)   100.5 F (38.1 C)  TempSrc: Oral   Oral  Resp:      SpO2:  96% 94%    General: Not in acute distress HEENT:       Eyes: PERRL, EOMI, no scleral icterus       ENT: No discharge from the ears and nose, has pharynx injection, no tonsillar enlargement.        Neck: No JVD, no bruit, no mass felt. Cardiac: S1/S2, RRR, No murmurs, No gallops or rubs Pulm: Clear to auscultation bilaterally. No rales, wheezing, rhonchi or rubs. Abd: Soft,  nondistended, nontender, no rebound pain, no organomegaly, BS present Ext: No edema bilaterally. 2+DP/PT pulse bilaterally Musculoskeletal: No joint deformities, erythema, or stiffness, ROM full Skin: No rashes.  Neuro: Alert and oriented X3, cranial nerves II-XII grossly intact, muscle strength 5/5 in all extremeties, sensation to light touch intact.  Psych: Patient is not psychotic, no suicidal or hemocidal ideation.  Labs on Admission:  Basic Metabolic Panel:  Recent Labs Lab 10/15/14 2247  NA 141  K 3.7  CL 106  CO2 21  GLUCOSE 91  BUN <5*  CREATININE 0.79  CALCIUM 8.4   Liver Function Tests:  Recent Labs Lab 10/15/14 2247  AST 64*  ALT 53  ALKPHOS 55  BILITOT 0.5  PROT 6.5  ALBUMIN 3.8   No results for input(s): LIPASE, AMYLASE in the last 168 hours. No results for input(s): AMMONIA in the last 168 hours. CBC:  Recent Labs Lab 10/15/14 2247  WBC 6.5  NEUTROABS 3.6  HGB 14.2  HCT 42.8  MCV 89.2  PLT 131*   Cardiac Enzymes: No results for input(s): CKTOTAL, CKMB, CKMBINDEX, TROPONINI in the last 168 hours.  BNP (last 3 results) No results for input(s): BNP in the last 8760 hours.  ProBNP (last 3 results) No results for input(s): PROBNP in the last 8760 hours.  CBG: No results for input(s): GLUCAP in the last 168 hours.  Radiological Exams on Admission: Dg Chest 2 View  10/16/2014   CLINICAL DATA:  Sudden onset chest pain and tightness radiating into the left in neck and left back. Nausea and vomiting. Started about 8:30 p.m. Recent sinus infection with cough, fever, and flu-like symptoms.  EXAM: CHEST  2 VIEW  COMPARISON:  04/06/2010  FINDINGS: Normal heart size and pulmonary vascularity. No focal airspace disease or consolidation in the lungs. No blunting of costophrenic angles. No pneumothorax. Mediastinal contours appear intact. Mild degenerative changes in the spine. Metallic piercing over the left breast.  IMPRESSION: No active cardiopulmonary  disease.   Electronically Signed   By: Lucienne Capers M.D.   On: 10/16/2014 00:16   Ct Angio Chest Pe W/cm &/or Wo Cm  10/16/2014   CLINICAL DATA:  Sudden onset chest pain and tightness radiating to the left neck and left back. Nausea and vomiting. Beginning about 830.  EXAM: CT ANGIOGRAPHY CHEST WITH CONTRAST  TECHNIQUE: Multidetector CT imaging of the chest was performed using the standard protocol during bolus administration of intravenous contrast. Multiplanar CT image reconstructions and MIPs were obtained to evaluate the vascular anatomy.  CONTRAST:  13mL OMNIPAQUE IOHEXOL 350 MG/ML SOLN  COMPARISON:  None.  FINDINGS: Technically adequate study with moderately good opacification of the central and segmental pulmonary arteries. Peripheral branch vessels are not well opacified due to bolus limitation. No filling defects identified. No evidence of significant central pulmonary embolus.  Normal heart size. Normal caliber thoracic  aorta. No evidence of aortic dissection. Coronary artery calcifications. Scattered mediastinal lymph nodes without pathologic enlargement, likely reactive. Esophagus is decompressed.  No focal airspace disease, consolidation, or interstitial process demonstrated in the lungs. No pleural effusion. No pneumothorax. Airways are patent.  Included portions of the upper abdominal organs demonstrate diffuse fatty infiltration of the liver. Degenerative changes in the spine.  Review of the MIP images confirms the above findings.  IMPRESSION: No evidence of significant pulmonary embolus. No evidence of active pulmonary disease. Fatty infiltration of the liver.   Electronically Signed   By: Lucienne Capers M.D.   On: 10/16/2014 03:19    EKG: Independently reviewed. Bifascicular AV block, QTc interval 466, no old EKG to compare with.  Assessment/Plan Principal Problem:   Sepsis Active Problems:   Hypoxia   Acid reflux   DDD (degenerative disc disease), lumbar   Tobacco abuse    Alcohol abuse   SOB (shortness of breath)   Cough   Fever   Nausea and vomiting  Sepsis: Patient has tachycardia, fever and elevated lactate, consistent with sepsis. His symptoms is typical for flu. Another differential diagnosis is atypical pneumonia. CT angiogram is negative for PE or infiltration.  The patient is hemodynamically stable currently.  -will admit to tele bed -Aggressive IV fluid: Normal saline 3 L, followed by 125 mL per hour -will get Procalcitonin and trend lactic acid level -start tamiflu and levaquin -Ibuprofen for fever (patient has mildly elevated transaminase, likely due to alcohol abuse. Not good candidate is for Tylenol) -will admit patient to telemetry bed  -Nebulizers: prn albuterol -Mucinex for cough  -Urine drug screen, HIV -Urine legionella and S. pneumococcal antigen -Follow up blood culture x2, sputum culture, respiratory virus panel, Flu pcr  Abnormal EKG: Bifascicular block. No old EKG to compare with. Patient had chest tightness. -Troponin 3 -Repeat EKG in morning  Tobacco abuse: -Nicotine patch  Alcohol abuse:  -CIWA protocol  GERD: -Protonix  DVT ppx: SQ Heparin          Code Status: Full code Family Communication: None at bed side.     Disposition Plan: Admit to inpatient   Date of Service 10/16/2014    Ivor Costa [facility] Pager [phone]  If 7PM-7AM, please contact night-coverage [email or web address] Password [facility] 10/16/2014, 6:29 AM

## 2014-10-16 NOTE — Progress Notes (Signed)
Utilization review completed.  

## 2014-10-16 NOTE — Progress Notes (Signed)
Pt seen and examined, admitted this am, pls see H&P for details Sepsis due to FLu vs other Viral etiology FU Blood Cx Continue IVF, FU Flu PCR, Blood Cx Tamiflu, levaquin  Domenic Polite, [place]

## 2014-10-16 NOTE — ED Provider Notes (Signed)
1:07 AM Patient signed out to me by Troy Mail, [name]. Patient pending CT angio to rule out PE.   6:05 AM Patient's CT angio unremarkable for PE. Patient dropped to 85% when ambulating. Patient will be admitted.   Results for orders placed or performed during the hospital encounter of 10/15/14  CBC with Differential/Platelet  Result Value Ref Range   WBC 6.5 4.0 - 10.5 K/uL   RBC 4.80 4.22 - 5.81 MIL/uL   Hemoglobin 14.2 13.0 - 17.0 g/dL   HCT 42.8 39.0 - 52.0 %   MCV 89.2 78.0 - 100.0 fL   MCH 29.6 26.0 - 34.0 pg   MCHC 33.2 30.0 - 36.0 g/dL   RDW 15.3 11.5 - 15.5 %   Platelets 131 (L) 150 - 400 K/uL   Neutrophils Relative % 57 43 - 77 %   Lymphocytes Relative 9 (L) 12 - 46 %   Monocytes Relative 7 3 - 12 %   Eosinophils Relative 27 (H) 0 - 5 %   Basophils Relative 0 0 - 1 %   Neutro Abs 3.6 1.7 - 7.7 K/uL   Lymphs Abs 0.6 (L) 0.7 - 4.0 K/uL   Monocytes Absolute 0.5 0.1 - 1.0 K/uL   Eosinophils Absolute 1.8 (H) 0.0 - 0.7 K/uL   Basophils Absolute 0.0 0.0 - 0.1 K/uL   WBC Morphology ATYPICAL LYMPHOCYTES   Comprehensive metabolic panel  Result Value Ref Range   Sodium 141 135 - 145 mmol/L   Potassium 3.7 3.5 - 5.1 mmol/L   Chloride 106 96 - 112 mmol/L   CO2 21 19 - 32 mmol/L   Glucose, Bld 91 70 - 99 mg/dL   BUN <5 (L) 6 - 23 mg/dL   Creatinine, Ser 0.79 0.50 - 1.35 mg/dL   Calcium 8.4 8.4 - 10.5 mg/dL   Total Protein 6.5 6.0 - 8.3 g/dL   Albumin 3.8 3.5 - 5.2 g/dL   AST 64 (H) 0 - 37 U/L   ALT 53 0 - 53 U/L   Alkaline Phosphatase 55 39 - 117 U/L   Total Bilirubin 0.5 0.3 - 1.2 mg/dL   GFR calc non Af Amer >90 >90 mL/min   GFR calc Af Amer >90 >90 mL/min   Anion gap 14 5 - 15  Urinalysis, Routine w reflex microscopic  Result Value Ref Range   Color, Urine YELLOW YELLOW   APPearance CLEAR CLEAR   Specific Gravity, Urine 1.012 1.005 - 1.030   pH 5.5 5.0 - 8.0   Glucose, UA NEGATIVE NEGATIVE mg/dL   Hgb urine dipstick NEGATIVE NEGATIVE   Bilirubin Urine NEGATIVE  NEGATIVE   Ketones, ur NEGATIVE NEGATIVE mg/dL   Protein, ur NEGATIVE NEGATIVE mg/dL   Urobilinogen, UA 0.2 0.0 - 1.0 mg/dL   Nitrite NEGATIVE NEGATIVE   Leukocytes, UA NEGATIVE NEGATIVE  I-stat troponin, ED  Result Value Ref Range   Troponin i, poc 0.00 0.00 - 0.08 ng/mL   Comment 3          I-Stat CG4 Lactic Acid, ED (not at [facility])  Result Value Ref Range   Lactic Acid, Venous 3.13 (HH) 0.5 - 2.0 mmol/L   Comment NOTIFIED PHYSICIAN   I-Stat CG4 Lactic Acid, ED (not at [facility])  Result Value Ref Range   Lactic Acid, Venous 2.41 (HH) 0.5 - 2.0 mmol/L   Comment NOTIFIED PHYSICIAN   I-stat troponin, ED  Result Value Ref Range   Troponin i, poc 0.00 0.00 - 0.08 ng/mL   Comment  3           Dg Chest 2 View  10/16/2014   CLINICAL DATA:  Sudden onset chest pain and tightness radiating into the left in neck and left back. Nausea and vomiting. Started about 8:30 p.m. Recent sinus infection with cough, fever, and flu-like symptoms.  EXAM: CHEST  2 VIEW  COMPARISON:  04/06/2010  FINDINGS: Normal heart size and pulmonary vascularity. No focal airspace disease or consolidation in the lungs. No blunting of costophrenic angles. No pneumothorax. Mediastinal contours appear intact. Mild degenerative changes in the spine. Metallic piercing over the left breast.  IMPRESSION: No active cardiopulmonary disease.   Electronically Signed   By: Lucienne Capers M.D.   On: 10/16/2014 00:16   Ct Angio Chest Pe W/cm &/or Wo Cm  10/16/2014   CLINICAL DATA:  Sudden onset chest pain and tightness radiating to the left neck and left back. Nausea and vomiting. Beginning about 830.  EXAM: CT ANGIOGRAPHY CHEST WITH CONTRAST  TECHNIQUE: Multidetector CT imaging of the chest was performed using the standard protocol during bolus administration of intravenous contrast. Multiplanar CT image reconstructions and MIPs were obtained to evaluate the vascular anatomy.  CONTRAST:  168mL OMNIPAQUE IOHEXOL 350 MG/ML SOLN  COMPARISON:  None.   FINDINGS: Technically adequate study with moderately good opacification of the central and segmental pulmonary arteries. Peripheral branch vessels are not well opacified due to bolus limitation. No filling defects identified. No evidence of significant central pulmonary embolus.  Normal heart size. Normal caliber thoracic aorta. No evidence of aortic dissection. Coronary artery calcifications. Scattered mediastinal lymph nodes without pathologic enlargement, likely reactive. Esophagus is decompressed.  No focal airspace disease, consolidation, or interstitial process demonstrated in the lungs. No pleural effusion. No pneumothorax. Airways are patent.  Included portions of the upper abdominal organs demonstrate diffuse fatty infiltration of the liver. Degenerative changes in the spine.  Review of the MIP images confirms the above findings.  IMPRESSION: No evidence of significant pulmonary embolus. No evidence of active pulmonary disease. Fatty infiltration of the liver.   Electronically Signed   By: Lucienne Capers M.D.   On: 10/16/2014 03:19      Alvina Chou, [name] 10/16/14 0606  Julianne Rice, MD 10/16/14 [phone]

## 2014-10-17 ENCOUNTER — Encounter (HOSPITAL_COMMUNITY): Payer: Self-pay | Admitting: General Practice

## 2014-10-17 LAB — BASIC METABOLIC PANEL
ANION GAP: 14 (ref 5–15)
BUN: <5 mg/dL — ABNORMAL LOW (ref 6–23)
CO2: 17 mmol/L — ABNORMAL LOW (ref 19–32)
CREATININE: 0.88 mg/dL (ref 0.50–1.35)
Calcium: 7.8 mg/dL — ABNORMAL LOW (ref 8.4–10.5)
Chloride: 103 mmol/L (ref 96–112)
GFR calc Af Amer: >90 mL/min (ref >90)
Glucose, Bld: 84 mg/dL (ref 70–99)
Potassium: 3.2 mmol/L — ABNORMAL LOW (ref 3.5–5.1)
Sodium: 134 mmol/L — ABNORMAL LOW (ref 135–145)

## 2014-10-17 LAB — URINE CULTURE
COLONY COUNT: NO GROWTH
Culture: NO GROWTH

## 2014-10-17 LAB — CBC
HCT: 39.8 % (ref 39.0–52.0)
Hemoglobin: 13.1 g/dL (ref 13.0–17.0)
MCH: 29.5 pg (ref 26.0–34.0)
MCHC: 32.9 g/dL (ref 30.0–36.0)
MCV: 89.6 fL (ref 78.0–100.0)
PLATELETS: 89 10*3/uL — AB (ref 150–400)
RBC: 4.44 MIL/uL (ref 4.22–5.81)
RDW: 15.4 % (ref 11.5–15.5)
WBC: 3.4 10*3/uL — ABNORMAL LOW (ref 4.0–10.5)

## 2014-10-17 LAB — LEGIONELLA ANTIGEN, URINE

## 2014-10-17 LAB — INFLUENZA PANEL BY PCR (TYPE A & B)
H1N1 flu by pcr: NOT DETECTED
INFLAPCR: POSITIVE — AB
INFLBPCR: NEGATIVE

## 2014-10-17 LAB — HIV ANTIBODY (ROUTINE TESTING W REFLEX): HIV SCREEN 4TH GENERATION: NONREACTIVE

## 2014-10-17 MED ORDER — LEVOFLOXACIN 750 MG PO TABS: 750.0000 mg | ORAL_TABLET | Freq: Every day | ORAL | Status: DC

## 2014-10-17 MED ORDER — PNEUMOCOCCAL VAC POLYVALENT 25 MCG/0.5ML IJ INJ: 0.5000 mL | INJECTION | INTRAMUSCULAR | Status: DC

## 2014-10-17 NOTE — Progress Notes (Signed)
[facility] PROGRESS NOTE  [name] WJX:[id number] DOB: Aug 02, 1955 DOA: 10/15/2014 PCP: Pcp Not In System  Assessment/Plan: Sepsis:  -improving -due to influenza A -stop ABx -Cut down IVF -blood Cx negative  Abnormal EKG: Bifascicular block. No old EKG to compare with.  -enzymes negative  Tobacco abuse: -Nicotine patch  Alcohol abuse:  -no withdrawal, thiamine,   GERD: -Protonix  DVT ppx: SQ Heparin   Code Status: Full Code Family Communication: none at bedside Disposition Plan: home tomorrow  HPI/Subjective: Feels much better, No N/V  Objective: Filed Vitals:   10/17/14 0656  BP: 164/82  Pulse: 91  Temp: 99.4 F (37.4 C)  Resp: 20    Intake/Output Summary (Last 24 hours) at 10/17/14 1201 Last data filed at 10/17/14 0900  Gross per 24 hour  Intake    600 ml  Output      0 ml  Net    600 ml   Filed Weights   10/16/14 1900 10/17/14 0656  Weight: 97.569 kg (215 lb 1.6 oz) 97.569 kg (215 lb 1.6 oz)    Exam:   General:  AAOx3  Cardiovascular: S1S2/RRR  Respiratory: CTAB  Abdomen: soft, NT, BS present  Musculoskeletal: no edema c/c  Data Reviewed: Basic Metabolic Panel:  Recent Labs Lab 10/15/14 2247  NA 141  K 3.7  CL 106  CO2 21  GLUCOSE 91  BUN <5*  CREATININE 0.79  CALCIUM 8.4   Liver Function Tests:  Recent Labs Lab 10/15/14 2247  AST 64*  ALT 53  ALKPHOS 55  BILITOT 0.5  PROT 6.5  ALBUMIN 3.8    Recent Labs Lab 10/16/14 0845  LIPASE 25   No results for input(s): AMMONIA in the last 168 hours. CBC:  Recent Labs Lab 10/15/14 2247 10/17/14 1035  WBC 6.5 3.4*  NEUTROABS 3.6  --   HGB 14.2 13.1  HCT 42.8 39.8  MCV 89.2 89.6  PLT 131* PENDING   Cardiac Enzymes:  Recent Labs Lab 10/16/14 0845 10/16/14 1420 10/16/14 1945  TROPONINI <0.03 <0.03 <0.03   BNP (last 3 results) No results for input(s): BNP in the last 8760 hours.  ProBNP (last 3 results) No results for  input(s): PROBNP in the last 8760 hours.  CBG: No results for input(s): GLUCAP in the last 168 hours.  Recent Results (from the past 240 hour(s))  Blood Culture (routine x 2)     Status: None (Preliminary result)   Collection Time: 10/15/14 11:08 PM  Result Value Ref Range Status   Specimen Description BLOOD LEFT ARM  Final   Special Requests BOTTLES DRAWN AEROBIC AND ANAEROBIC 10CC EA  Final   Culture   Final           BLOOD CULTURE RECEIVED NO GROWTH TO DATE CULTURE WILL BE HELD FOR 5 DAYS BEFORE ISSUING A FINAL NEGATIVE REPORT Note: Culture results may be compromised due to an excessive volume of blood received in culture bottles. Performed at [facility]    Report Status PENDING  Incomplete  Blood Culture (routine x 2)     Status: None (Preliminary result)   Collection Time: 10/15/14 11:12 PM  Result Value Ref Range Status   Specimen Description BLOOD LEFT HAND  Final   Special Requests BOTTLES DRAWN AEROBIC ONLY 10CC  Final   Culture   Final           BLOOD CULTURE RECEIVED NO GROWTH TO DATE CULTURE WILL BE HELD FOR 5 DAYS BEFORE ISSUING A FINAL  NEGATIVE REPORT Performed at [facility]    Report Status PENDING  Incomplete  Urine culture     Status: None   Collection Time: 10/16/14  2:49 AM  Result Value Ref Range Status   Specimen Description URINE, CLEAN CATCH  Final   Special Requests NONE  Final   Colony Count NO GROWTH Performed at [facility]   Final   Culture NO GROWTH Performed at [facility]   Final   Report Status 10/17/2014 FINAL  Final  Culture, respiratory (NON-Expectorated)     Status: None (Preliminary result)   Collection Time: 10/16/14  7:14 AM  Result Value Ref Range Status   Specimen Description SPUTUM  Final   Special Requests NONE  Final   Gram Stain   Final    MODERATE WBC PRESENT, PREDOMINANTLY PMN NO SQUAMOUS EPITHELIAL CELLS SEEN FEW GRAM POSITIVE RODS Performed at [facility]    Culture    Final    Culture reincubated for better growth Performed at [facility]    Report Status PENDING  Incomplete  Culture, sputum-assessment     Status: None   Collection Time: 10/16/14 10:19 AM  Result Value Ref Range Status   Specimen Description SPUTUM  Final   Special Requests NONE  Final   Sputum evaluation   Final    THIS SPECIMEN IS ACCEPTABLE. RESPIRATORY CULTURE REPORT TO FOLLOW.   Report Status 10/16/2014 FINAL  Final     Studies: Dg Chest 2 View  10/16/2014   CLINICAL DATA:  Sudden onset chest pain and tightness radiating into the left in neck and left back. Nausea and vomiting. Started about 8:30 p.m. Recent sinus infection with cough, fever, and flu-like symptoms.  EXAM: CHEST  2 VIEW  COMPARISON:  04/06/2010  FINDINGS: Normal heart size and pulmonary vascularity. No focal airspace disease or consolidation in the lungs. No blunting of costophrenic angles. No pneumothorax. Mediastinal contours appear intact. Mild degenerative changes in the spine. Metallic piercing over the left breast.  IMPRESSION: No active cardiopulmonary disease.   Electronically Signed   By: Lucienne Capers M.D.   On: 10/16/2014 00:16   Ct Angio Chest Pe W/cm &/or Wo Cm  10/16/2014   CLINICAL DATA:  Sudden onset chest pain and tightness radiating to the left neck and left back. Nausea and vomiting. Beginning about 830.  EXAM: CT ANGIOGRAPHY CHEST WITH CONTRAST  TECHNIQUE: Multidetector CT imaging of the chest was performed using the standard protocol during bolus administration of intravenous contrast. Multiplanar CT image reconstructions and MIPs were obtained to evaluate the vascular anatomy.  CONTRAST:  141mL OMNIPAQUE IOHEXOL 350 MG/ML SOLN  COMPARISON:  None.  FINDINGS: Technically adequate study with moderately good opacification of the central and segmental pulmonary arteries. Peripheral branch vessels are not well opacified due to bolus limitation. No filling defects identified. No evidence of  significant central pulmonary embolus.  Normal heart size. Normal caliber thoracic aorta. No evidence of aortic dissection. Coronary artery calcifications. Scattered mediastinal lymph nodes without pathologic enlargement, likely reactive. Esophagus is decompressed.  No focal airspace disease, consolidation, or interstitial process demonstrated in the lungs. No pleural effusion. No pneumothorax. Airways are patent.  Included portions of the upper abdominal organs demonstrate diffuse fatty infiltration of the liver. Degenerative changes in the spine.  Review of the MIP images confirms the above findings.  IMPRESSION: No evidence of significant pulmonary embolus. No evidence of active pulmonary disease. Fatty infiltration of the liver.   Electronically Signed  By: Lucienne Capers M.D.   On: 10/16/2014 03:19    Scheduled Meds: . dextromethorphan-guaiFENesin  1 tablet Oral BID  . folic acid  1 mg Oral Daily  . heparin  5,000 Units Subcutaneous 3 times per day  . multivitamin with minerals  1 tablet Oral Daily  . nicotine  21 mg Transdermal Daily  . oseltamivir  75 mg Oral BID  . pantoprazole  40 mg Oral Daily  . sodium chloride  1,000 mL Intravenous Once  . thiamine  100 mg Oral Daily   Or  . thiamine  100 mg Intravenous Daily   Continuous Infusions: . sodium chloride 75 mL/hr at 10/17/14 0954   Antibiotics Given (last 72 hours)    Date/Time Action Medication Dose Rate   10/16/14 0631 Given   oseltamivir (TAMIFLU) capsule 75 mg 75 mg    10/16/14 1009 Given   oseltamivir (TAMIFLU) capsule 75 mg 75 mg    10/17/14 0544 Given   levofloxacin (LEVAQUIN) IVPB 750 mg 750 mg 100 mL/hr   10/17/14 1100 Given   oseltamivir (TAMIFLU) capsule 75 mg 75 mg       Principal Problem:   Sepsis Active Problems:   Hypoxia   Acid reflux   DDD (degenerative disc disease), lumbar   Tobacco abuse   Alcohol abuse   SOB (shortness of breath)   Cough   Fever   Nausea and vomiting    Time spent:  68min    [facility] Pager [phone]. If 7PM-7AM, please contact night-coverage at [email or web address], password [facility] 10/17/2014, 12:01 PM  LOS: 1 day

## 2014-10-18 DIAGNOSIS — J111 Influenza due to unidentified influenza virus with other respiratory manifestations: Secondary | ICD-10-CM | POA: Diagnosis present

## 2014-10-18 LAB — CULTURE, RESPIRATORY: Culture: NORMAL

## 2014-10-18 LAB — BASIC METABOLIC PANEL
Anion gap: 6 (ref 5–15)
BUN: <5 mg/dL — ABNORMAL LOW (ref 6–23)
CO2: 25 mmol/L (ref 19–32)
CREATININE: 0.81 mg/dL (ref 0.50–1.35)
Calcium: 7.9 mg/dL — ABNORMAL LOW (ref 8.4–10.5)
Chloride: 105 mmol/L (ref 96–112)
GFR calc Af Amer: >90 mL/min (ref >90)
GFR calc non Af Amer: >90 mL/min (ref >90)
Glucose, Bld: 96 mg/dL (ref 70–99)
POTASSIUM: 3.5 mmol/L (ref 3.5–5.1)
Sodium: 136 mmol/L (ref 135–145)

## 2014-10-18 LAB — CBC
HCT: 38 % — ABNORMAL LOW (ref 39.0–52.0)
Hemoglobin: 12.8 g/dL — ABNORMAL LOW (ref 13.0–17.0)
MCH: 30.1 pg (ref 26.0–34.0)
MCHC: 33.7 g/dL (ref 30.0–36.0)
MCV: 89.4 fL (ref 78.0–100.0)
PLATELETS: 92 10*3/uL — AB (ref 150–400)
RBC: 4.25 MIL/uL (ref 4.22–5.81)
RDW: 15.7 % — AB (ref 11.5–15.5)
WBC: 3.5 10*3/uL — AB (ref 4.0–10.5)

## 2014-10-18 LAB — RESPIRATORY VIRUS PANEL
ADENOVIRUS: NEGATIVE
INFLUENZA A: POSITIVE — AB
Influenza B: NEGATIVE
Metapneumovirus: NEGATIVE
Parainfluenza 1: NEGATIVE
Parainfluenza 2: NEGATIVE
Parainfluenza 3: NEGATIVE
RESPIRATORY SYNCYTIAL VIRUS B: NEGATIVE
Respiratory Syncytial Virus A: NEGATIVE
Rhinovirus: NEGATIVE

## 2014-10-18 LAB — CULTURE, RESPIRATORY W GRAM STAIN

## 2014-10-18 MED ORDER — UNABLE TO FIND: Status: DC

## 2014-10-18 MED ORDER — NICOTINE 21 MG/24HR TD PT24: 21.0000 mg | MEDICATED_PATCH | Freq: Every day | TRANSDERMAL | Status: DC

## 2014-10-18 MED ORDER — ASPIRIN 81 MG PO TABS
81.0000 mg | ORAL_TABLET | Freq: Every day | ORAL | Status: DC
Start: 2014-10-18 — End: 2016-11-16

## 2014-10-18 MED ORDER — OSELTAMIVIR PHOSPHATE 75 MG PO CAPS: 75.0000 mg | ORAL_CAPSULE | Freq: Two times a day (BID) | ORAL | Status: DC

## 2014-10-18 NOTE — Discharge Summary (Signed)
Physician Discharge Summary  [name] NOI:[id number] DOB: 1956-02-02 DOA: 10/15/2014  PCP: No PCP Per Patient  Admit date: 10/15/2014 Discharge date: 10/18/2014  Time spent: 45 minutes  Recommendations for Outpatient Follow-up:  1. CBC in 1 week to Fu on platelet count  Discharge Diagnoses:  Principal Problem:   Sepsis   Influenza A   Hypoxia   Acid reflux   DDD (degenerative disc disease), lumbar   Tobacco abuse   Alcohol abuse   SOB (shortness of breath)   Cough   Fever   Nausea and vomiting   Thrombocytopenia  Discharge Condition: stable  Diet recommendation: regular  Filed Weights   10/16/14 1900 10/17/14 0656  Weight: 97.569 kg (215 lb 1.6 oz) 97.569 kg (215 lb 1.6 oz)    History of present illness:    Hospital Course:  Sepsis:  -improved with IVF, Tamiflu, supportive care -his influenza PCR positive for influenza A -stopped antibiotics, he will complete 5day course of Tamiflu -blood Cx negative  Abnormal EKG: Bifascicular block. No old EKG to compare with.  -enzymes negative  Thrombocytopenia -due to sepsis, flu -stable, needs CBC FU in 1 week  Tobacco abuse: -counseled and Nicotine patch prescribed  Alcohol abuse:  -no withdrawal, thiamine,   GERD: -Protonix  Discharge Exam: Filed Vitals:   10/18/14 0600  BP:   Pulse: 77  Temp:   Resp:     General: AAOx3 Cardiovascular: S1S2/RRR Respiratory: CTAB  Discharge Instructions   Discharge Instructions    Diet - low sodium heart healthy    Complete by:  As directed      Increase activity slowly    Complete by:  As directed           Current Discharge Medication List    START taking these medications   Details  aspirin 81 MG tablet Take 1 tablet (81 mg total) by mouth daily.    nicotine (NICODERM CQ - DOSED IN MG/24 HOURS) 21 mg/24hr patch Place 1 patch (21 mg total) onto the skin daily. Qty: 28 patch, Refills: 0    oseltamivir (TAMIFLU) 75 MG capsule Take 1  capsule (75 mg total) by mouth 2 (two) times daily. For 3days Qty: 6 capsule, Refills: 0    UNABLE TO FIND This note is to excuse Mr.Chrobak from work 4/9 -4/17 due to medical illness requiring hospitalization Qty: 1 each, Refills: 0      CONTINUE these medications which have NOT CHANGED   Details  omeprazole (PRILOSEC) 20 MG capsule Take 20 mg by mouth daily.    methocarbamol (ROBAXIN) 500 MG tablet Take 2 tablets (1,000 mg total) by mouth 4 (four) times daily as needed for muscle spasms (muscle spasm/pain). Qty: 25 tablet, Refills: 0      STOP taking these medications     doxycycline (VIBRAMYCIN) 100 MG capsule      predniSONE (DELTASONE) 5 MG tablet      HYDROcodone-acetaminophen (NORCO/VICODIN) 5-325 MG per tablet        Allergies  Allergen Reactions  . Penicillins Shortness Of Breath    Anaphilaxis   Follow-up Information    Follow up with PCP  In 1 week.   Why:  need CBC in 1 -2weeks to FU on platelet count       The results of significant diagnostics from this hospitalization (including imaging, microbiology, ancillary and laboratory) are listed below for reference.    Significant Diagnostic Studies: Dg Chest 2 View  10/16/2014   CLINICAL DATA:  Sudden onset chest pain and tightness radiating into the left in neck and left back. Nausea and vomiting. Started about 8:30 p.m. Recent sinus infection with cough, fever, and flu-like symptoms.  EXAM: CHEST  2 VIEW  COMPARISON:  04/06/2010  FINDINGS: Normal heart size and pulmonary vascularity. No focal airspace disease or consolidation in the lungs. No blunting of costophrenic angles. No pneumothorax. Mediastinal contours appear intact. Mild degenerative changes in the spine. Metallic piercing over the left breast.  IMPRESSION: No active cardiopulmonary disease.   Electronically Signed   By: Lucienne Capers M.D.   On: 10/16/2014 00:16   Ct Angio Chest Pe W/cm &/or Wo Cm  10/16/2014   CLINICAL DATA:  Sudden onset chest pain  and tightness radiating to the left neck and left back. Nausea and vomiting. Beginning about 830.  EXAM: CT ANGIOGRAPHY CHEST WITH CONTRAST  TECHNIQUE: Multidetector CT imaging of the chest was performed using the standard protocol during bolus administration of intravenous contrast. Multiplanar CT image reconstructions and MIPs were obtained to evaluate the vascular anatomy.  CONTRAST:  164mL OMNIPAQUE IOHEXOL 350 MG/ML SOLN  COMPARISON:  None.  FINDINGS: Technically adequate study with moderately good opacification of the central and segmental pulmonary arteries. Peripheral branch vessels are not well opacified due to bolus limitation. No filling defects identified. No evidence of significant central pulmonary embolus.  Normal heart size. Normal caliber thoracic aorta. No evidence of aortic dissection. Coronary artery calcifications. Scattered mediastinal lymph nodes without pathologic enlargement, likely reactive. Esophagus is decompressed.  No focal airspace disease, consolidation, or interstitial process demonstrated in the lungs. No pleural effusion. No pneumothorax. Airways are patent.  Included portions of the upper abdominal organs demonstrate diffuse fatty infiltration of the liver. Degenerative changes in the spine.  Review of the MIP images confirms the above findings.  IMPRESSION: No evidence of significant pulmonary embolus. No evidence of active pulmonary disease. Fatty infiltration of the liver.   Electronically Signed   By: Lucienne Capers M.D.   On: 10/16/2014 03:19    Microbiology: Recent Results (from the past 240 hour(s))  Blood Culture (routine x 2)     Status: None (Preliminary result)   Collection Time: 10/15/14 11:08 PM  Result Value Ref Range Status   Specimen Description BLOOD LEFT ARM  Final   Special Requests BOTTLES DRAWN AEROBIC AND ANAEROBIC 10CC EA  Final   Culture   Final           BLOOD CULTURE RECEIVED NO GROWTH TO DATE CULTURE WILL BE HELD FOR 5 DAYS BEFORE ISSUING A  FINAL NEGATIVE REPORT Note: Culture results may be compromised due to an excessive volume of blood received in culture bottles. Performed at [facility]    Report Status PENDING  Incomplete  Blood Culture (routine x 2)     Status: None (Preliminary result)   Collection Time: 10/15/14 11:12 PM  Result Value Ref Range Status   Specimen Description BLOOD LEFT HAND  Final   Special Requests BOTTLES DRAWN AEROBIC ONLY 10CC  Final   Culture   Final           BLOOD CULTURE RECEIVED NO GROWTH TO DATE CULTURE WILL BE HELD FOR 5 DAYS BEFORE ISSUING A FINAL NEGATIVE REPORT Performed at [facility]    Report Status PENDING  Incomplete  Urine culture     Status: None   Collection Time: 10/16/14  2:49 AM  Result Value Ref Range Status   Specimen Description URINE, CLEAN CATCH  Final   Special Requests NONE  Final   Colony Count NO GROWTH Performed at [facility]   Final   Culture NO GROWTH Performed at [facility]   Final   Report Status 10/17/2014 FINAL  Final  Culture, respiratory (NON-Expectorated)     Status: None   Collection Time: 10/16/14  7:14 AM  Result Value Ref Range Status   Specimen Description SPUTUM  Final   Special Requests NONE  Final   Gram Stain   Final    MODERATE WBC PRESENT, PREDOMINANTLY PMN NO SQUAMOUS EPITHELIAL CELLS SEEN FEW GRAM POSITIVE RODS Performed at [facility]    Culture   Final    NORMAL OROPHARYNGEAL FLORA Performed at [facility]    Report Status 10/18/2014 FINAL  Final  Culture, sputum-assessment     Status: None   Collection Time: 10/16/14 10:19 AM  Result Value Ref Range Status   Specimen Description SPUTUM  Final   Special Requests NONE  Final   Sputum evaluation   Final    THIS SPECIMEN IS ACCEPTABLE. RESPIRATORY CULTURE REPORT TO FOLLOW.   Report Status 10/16/2014 FINAL  Final     Labs: Basic Metabolic Panel:  Recent Labs Lab 10/15/14 2247 10/17/14 1035 10/18/14 0512  NA  141 134* 136  K 3.7 3.2* 3.5  CL 106 103 105  CO2 21 17* 25  GLUCOSE 91 84 96  BUN <5* <5* <5*  CREATININE 0.79 0.88 0.81  CALCIUM 8.4 7.8* 7.9*   Liver Function Tests:  Recent Labs Lab 10/15/14 2247  AST 64*  ALT 53  ALKPHOS 55  BILITOT 0.5  PROT 6.5  ALBUMIN 3.8    Recent Labs Lab 10/16/14 0845  LIPASE 25   No results for input(s): AMMONIA in the last 168 hours. CBC:  Recent Labs Lab 10/15/14 2247 10/17/14 1035 10/18/14 0512  WBC 6.5 3.4* 3.5*  NEUTROABS 3.6  --   --   HGB 14.2 13.1 12.8*  HCT 42.8 39.8 38.0*  MCV 89.2 89.6 89.4  PLT 131* 89* 92*   Cardiac Enzymes:  Recent Labs Lab 10/16/14 0845 10/16/14 1420 10/16/14 1945  TROPONINI <0.03 <0.03 <0.03   BNP: BNP (last 3 results) No results for input(s): BNP in the last 8760 hours.  ProBNP (last 3 results) No results for input(s): PROBNP in the last 8760 hours.  CBG: No results for input(s): GLUCAP in the last 168 hours.     SignedDomenic Polite  [facility] 10/18/2014, 9:18 AM

## 2014-10-18 NOTE — Care Management Note (Signed)
    Page 1 of 1   10/18/2014     12:22:24 PM CARE MANAGEMENT NOTE 10/18/2014  Patient:  [name], [name]   Account Number:  [id number]  Date Initiated:  10/18/2014  Documentation initiated by:  [name]  Subjective/Objective Assessment:   Pt admitted with sepsis +flu     Action/Plan:   PTA pt lived at home   Anticipated DC Date:  10/18/2014   Anticipated DC Plan:  [name]  CM consult  PCP issues      Choice offered to / List presented to:             Status of service:  Completed, signed off Medicare Important Message given?  NO (If response is "NO", the following Medicare IM given date fields will be blank) Date Medicare IM given:   Medicare IM given by:   Date Additional Medicare IM given:   Additional Medicare IM given by:    Discharge Disposition:  HOME/SELF CARE  Per UR Regulation:  Reviewed for med. necessity/level of care/duration of stay  If discussed at [place] of Stay Meetings, dates discussed:    Comments:  10/18/14- 1000- Troy Gibbons RN, BSN [phone] Requested by MD to see pt regarding PCP needs- spoke with pt at bedside- pt confirmed that he does not have PCP- pt has [facility] insurance- pt given info on [employer] # and how to use to find a local PCP- pt to f/u with finding PCP.

## 2014-10-18 NOTE — Progress Notes (Signed)
Dc instructions given to pt at this time.  Pt verbalized understanding.  No s/s of any acute distress.  Call bell in reach.

## 2014-10-22 LAB — CULTURE, BLOOD (ROUTINE X 2)
Culture: NO GROWTH
Culture: NO GROWTH

## 2014-10-25 NOTE — Consult Note (Signed)
Chief Complaint:   Subjective/Chief Complaint Covering for Dr. Vira Agar. Feels much better today after starting IV steroids. No more nausea. 4 BM's so far today. No abd pain. WBC coming down.   VITAL SIGNS/ANCILLARY NOTES: **Vital Signs.:   [id number] 04:25   Vital Signs Type Routine   Temperature Temperature (F) 97.8   Celsius 36.5   Temperature Source Oral   Pulse Pulse 76   Respirations Respirations 20   Systolic BP Systolic BP 742   Diastolic BP (mmHg) Diastolic BP (mmHg) 82   Mean BP 93   Pulse Ox % Pulse Ox % 97   Pulse Ox Activity Level  At rest   Oxygen Delivery Room Air/ 21 %   Brief Assessment:   Cardiac Regular    Respiratory clear BS    Gastrointestinal Normal   Lab Results:  Routine Hem:  16-Nov-13 04:05    WBC (CBC)  20.0   RBC (CBC)  4.32   Hemoglobin (CBC) 13.3   Hematocrit (CBC)  37.7   Platelet Count (CBC) 189 (Result(s) reported on 23 May 2012 at 05:53AM.)   MCV 87   MCH 30.9   MCHC 35.4   RDW 14.5   Bands 1   Segmented Neutrophils 17   Lymphocytes 8   Monocytes 5   Eosinophil 68   Basophil 1   Diff Comment 1 RBCs APPEAR NORMAL   Diff Comment 2 PLTS VARIED IN SIZE  Result(s) reported on 23 May 2012 at 05:53AM.   Assessment/Plan:  Assessment/Plan:   Assessment hypereosinophillic syndrome. severe duodenitis. on IV steroids.    Plan Keep on IV steroids over the weekend. Since nausea resolved, keep on liquid diet for now. Plan colonoscopy on Monday with Dr. Vira Agar. Bowel prep tomorrow afternoon. Make sure K stays above 3 for colonoscopy. Thanks.   Electronic Signatures: Verdie Shire (MD)  (Signed [phone] 10:23)  Authored: Chief Complaint, VITAL SIGNS/ANCILLARY NOTES, Brief Assessment, Lab Results, Assessment/Plan   Last Updated: 16-Nov-13 10:23 by Verdie Shire (MD)

## 2014-10-25 NOTE — Consult Note (Signed)
CC: hypereosinophilic syndrome.  Pt colonoscopy showed 2 very small polyps and small int hemorrhoids.  Bx done for mucosal sampling.  He can be discharged on meds and see me in 2-3 weeks.  Electronic Signatures: Manya Silvas (MD)  (Signed on [phone] 18:27)  Authored  Last Updated: 02-HEN-27 18:27 by Manya Silvas (MD)

## 2014-10-25 NOTE — Consult Note (Signed)
CC: hypereosinophilic syndrome.  Plan to do EGD tomorrow and possible colonoscopy Monday for abd pain and heme pos stool.  For full consult see NP note.  Electronic Signatures: Manya Silvas (MD)  (Signed on [phone] 17:08)  Authored  Last Updated: 14-Nov-13 17:08 by Manya Silvas (MD)

## 2014-10-25 NOTE — Consult Note (Signed)
PATIENT NAME:  [name], [name] MR#:  [id number] DATE OF BIRTH:  09-30-1955  DATE OF CONSULTATION:  05/22/2012  REFERRING PHYSICIAN:  Dr. Vira Agar  CONSULTING PHYSICIAN:  Leigha Olberding R. Ma Hillock, MD  REASON FOR CONSULTATION: History of eosinophilic gastroenteritis, chronic prednisone therapy, rule out eosinophilic leukemia.   HISTORY OF PRESENT ILLNESS: Patient is a 59 year old gentleman who has been admitted to hospital on 11/14 for complaints of generalized abdominal pain with nausea, vomiting and diarrhea. 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 Patient states that he has been on chronic prednisone therapy on and off up until early 2013 when it was completely discontinued and he has not taken prednisone for many months now. He is unsure of the exact diagnosis concerning the eosinophilia but states that it was felt to be related to his colitis issue. He denies any history of congestive heart failure. He denies any skin lesions or rashes. He denies any new shortness of breath or cough but lately has been feeling more fatigued since developing GI symptoms for the last few weeks. He is planned for EGD tomorrow and possibly colonoscopy on Monday. He denies any blood in the stools. He denies any recent travel or sick contacts with the exception of visiting [place] transiently. Denies any bleeding issues. No new bone pains. Appetite is good, denies unintentional weight loss.   PAST MEDICAL HISTORY/PAST SURGICAL HISTORY:  1. 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  2. Gastroesophageal reflux disease.  3. Exploratory laparotomy with 2 to 3 feet of small bowel resection in the past.   FAMILY HISTORY: Father with history of lung cancer, mother has diabetes. Otherwise denies hematological disorders.   SOCIAL HISTORY: Chronic smoker 1 pack per day. Patient takes  alcohol regularly, three shots of liquor and 32 ounces beer daily. Denies recreational drug usage. Lives with girlfriend.   ALLERGIES: Penicillin.   CURRENT MEDICATIONS IN HOSPITAL:  1. Solu-Medrol 20 mg IV q.12 hours.  2. Nicotine patch 21 mg transdermal daily. 3. Protonix 40 mg IV q.12 hours. 4. Lovenox 40 mg sub-Q daily. 5. Ativan 2 mg IV q.4 hours p.r.n.  6. Morphine 2 mg IV q.4 hours p.r.n. for pain. 7. Zofran 4 mg IV q.6 hours p.r.n. for nausea.   REVIEW OF SYSTEMS: CONSTITUTIONAL: Patient is active and ambulatory, has some fatigue on exertion. No fevers or chills. No major night sweats. HEENT: Denies headaches, dizziness, epistaxis, ear or jaw pain. CARDIAC: Denies any angina, palpitation, orthopnea, or paroxysmal nocturnal dyspnea. LUNGS: Has intermittent mild dyspnea on physical activity, otherwise no shortness of breath, cough, sputum, or hemoptysis. GASTROINTESTINAL: As in history of present illness. Mostly diarrhea. Currently nausea and vomiting is better. No bright red blood in stools or melena. GENITOURINARY: No dysuria or hematuria. SKIN: No new rashes or pruritus. HEMATOLOGIC: Denies bleeding symptoms. NEUROLOGIC: No new focal weakness, seizures, or loss of consciousness. No new paresthesias in extremities. EXTREMITIES: No new swelling or pain. ENDOCRINE: No polyuria or polydipsia. PSYCH: Denies any major depression or anxiety.   PHYSICAL EXAMINATION:  GENERAL: Patient is a moderately built well-nourished individual sitting in bed, alert and oriented and converses appropriately. No icterus.   VITAL SIGNS: Temperature 98.6, pulse 92, respirations 20, blood pressure 131/88, 97% on room air.   HEENT: Normocephalic, atraumatic. Extraocular movements intact. Sclera anicteric. No oral thrush or petechiae.   NECK: Supple without lymphadenopathy.   CARDIOVASCULAR: S1 and S2, regular  rate and rhythm.   LUNGS: Bilateral good air entry with no crepitations or rhonchi.   ABDOMEN:  Soft, nontender. No hepatosplenomegaly or masses palpable clinically.   EXTREMITIES: No major edema or cyanosis.   SKIN: No generalized rashes or lesions.   LYMPHATICS: No adenopathy in the axillary or inguinal areas either.   NEUROLOGICAL: Limited exam. Cranial nerves intact. Moves all extremities spontaneously.   LABORATORY, DIAGNOSTIC, AND RADIOLOGICAL DATA: WBC 27,200 with 8% neutrophils, 7.6% lymphocytes, 81% eosinophils, 3% monocytes. [place] 2300, absolute eosinophil count 22,600. Hemoglobin 14.9, platelets 204, MCV 88, creatinine 0.86. Liver functions unremarkable except slightly low albumin of 3.1. PSA normal at 0.8. C-reactive protein elevated at 17.4, ESR normal at 1. HIV antibody is pending. Stool negative for C. difficile and WBCs. Stool culture, ova and parasites are pending.   IMPRESSION AND RECOMMENDATIONS: 59 year old gentleman with long-standing history of eosinophilic gastroenteritis at least for last 20 years who has been on intermittent prednisone therapy which was totally discontinued in early 2013 currently admitted with recurrent vomiting and diarrhea. CT scan of the abdomen and pelvis on 11/05 reported diffuse bowel wall thickening throughout the colon suggestive of colitis, no other lymphadenopathy or hepatosplenomegaly reported. Also stool Hemoccult is positive. Patient currently is on low dose Solu-Medrol therapy, symptoms seem to be improving clinically. He is being planned for EGD today and possibly colonoscopy on Monday, the eighteenth. Given long-standing history of significant absolute eosinophilia, raises possibility of hypereosinophilic syndrome versus other etiology. Will get further work-up and draw special tests on Monday, the eighteenth. Currently hepatitis panel, HIV, stool culture and stool ova and parasites are pending. Will get chest x-ray to rule out pulmonary fibrosis. Will get echocardiogram to rule out any cardiac abnormalities related to hypereosinophilia. Will  draw labs on eighteenth including peripheral blood immunophenotyping study to rule out monoclonal B cell population, serum IgE, SIEP to evaluate for quantitative immunoglobulins, serum tryptase, serum B12 to see if it is elevated, BCR-ABL study to rule out possibility of chronic myeloid leukemia. Agree with ongoing treatment, if hypereosinophilia persists without improvement, may need to consider bone marrow biopsy and also check for FIP1A/PDGFRA FISH panel in the near future. Will continue to follow. Patient explained above, agreeable to this plan.   Thank you for the referral. Please feel free to contact me if any additional questions. ____________________________ Rhett Bannister Ma Hillock, MD srp:cms D: 05/23/2012 11:02:00 ET T: 05/23/2012 11:31:39 ET  JOB#: [id number] cc: Kanitra Purifoy R. Ma Hillock, MD, <Dictator> Alveta Heimlich MD ELECTRONICALLY SIGNED 05/23/2012 12:51

## 2014-10-25 NOTE — Consult Note (Signed)
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  Dr. Candace Cruise will cover over the weekend and will see the patient.  Electronic Signatures: Manya Silvas (MD)  (Signed on 15-Nov-13 13:12)  Authored  Last Updated: [id number] 13:12 by Manya Silvas (MD)

## 2014-10-25 NOTE — H&P (Signed)
PATIENT NAME:  [name], [name] MR#:  [id number] DATE OF BIRTH:  [birth year]  DATE OF ADMISSION:  05/21/2012  PRIMARY CARE PHYSICIAN:  Dr. Teresa Pelton.   CHIEF COMPLAINT: Generalized abdominal pain associated with nausea, vomiting and diarrhea.   HISTORY OF PRESENT ILLNESS: The patient is a 59 year old African American male with a past medical history of gastroesophageal reflux disease and remote history of eosinophilic gastroenteritis status post exploratory laparotomy and small intestinal dissection presenting to the ER with a chief complaint of four week history of abdominal pain associated with diarrhea and two day history of nausea and vomiting. The patient is reporting that for the past four weeks he is having diffuse crampy abdominal pain in the lower part associated with dark tarry stool. He was evaluated by ER x2 and had CT scan of the abdomen done x2. He was evaluated by them on 11/05 and at that time the CT scan of the abdomen and pelvis showed thickening of the colon and possible colitis. The patient was discharged home with a diagnosis of infectious colitis and he was discharged home with ciprofloxacin and Flagyl. He was taking antibiotics as prescribed with no significant improvement. For the past two days he has been nauseated and started vomiting. Denies any blood in his vomit. But the stool looks black and tarry. Feeling weak associated with decreased appetite. He thinks he lost a couple of pounds as he is not eating for the past few days. The patient's eosinophil count is elevated at 30.6 and white count is 39.1. Potassium is 3.1. The CT scan of the abdomen and pelvis was done on 11/05 which revealed diffuse thickening of the colonic wall which was suggesting colitis, likely infectious. No abscess was noted at that time. The patient was discharged home with p.o. ciprofloxacin and Flagyl. The patient has been taking these antibiotics with no significant improvement. Since yesterday he  started vomiting and could not keep any food down. Denies any recent travel or any other travels. No sick contacts. The patient also reported that for the past 1-1/2 day he is not passing any gas and denies any bowel movements. He feels like he is stopped up. Abdominal pain is in the lower area, which is crampy in nature, 6-8/10.   The patient has previous past medical history of eosinophilic gastroenteritis which was diagnosed 26 years ago and had exploratory laparotomy and resection of 2.5 to 3 feet of small intestine at that time. Following that, he was placed on prednisone, which he has continued for several years and recently it was stopped by another physician in [place], approximately a few months ago. The patient denies any other complaints. He denies any dizziness, chest pain or shortness of breath. Denies any loss of consciousness. Denies any fevers. Of note, he recently has visited the [place].   PAST MEDICAL HISTORY:  1. Gastroesophageal reflux disease.  2. 20-year history of prednisone for eosinophilic gastroenteritis which was discontinued by a [place] a few months ago.   PAST SURGICAL HISTORY: Exploratory laparotomy with 2-1/2 to 3 feet of small bowel resection.   ALLERGIES: The patient is allergic to penicillin. It causes laryngeal edema.   PSYCHOSOCIAL HISTORY: Lives with girlfriend. Smokes 1 pack a day. Drinks three shots of liquor as well as 32 ounces of beer everyday. Denies any street drugs.   FAMILY HISTORY: Dad has history of lung cancer. Mother has diabetes mellitus.   REVIEW OF SYSTEMS:  The patient denies any fever, but complaining of fatigue, weakness,  possible weight loss. Denies any weight gain. EYES: Denies any blurry vision, redness, inflammation, glaucoma or cataracts. ENT: Denies tinnitus, ear pain, hearing loss, snoring, postnasal drip or swallowing difficulty. RESPIRATORY: Denies cough, wheezing, hemoptysis, chronic obstructive pulmonary disease,  tuberculosis or pneumonia. CARDIOVASCULAR: Denies chest pressure, chest pain, palpitations, syncope, or varicose veins. GASTROINTESTINAL: Complaining of nausea, vomiting and diarrhea. Complaining of abdominal pain. Positive melena. Denies ulcers. Positive gastroesophageal reflux disease. Denies irritable bowel syndrome, jaundice, hemorrhoids or hernia. GENITOURINARY: Denies dysuria, hematuria, renal calculus, frequency or incontinence. ENDOCRINE: Denies polyuria, polydipsia, nocturia, or thyroid problems. HEMATOLOGIC/LYMPH: Denies anemia, easy bruising, bleeding or swollen glands. INTEGUMENTARY: Denies acne, rash, or lesions. MUSCULOSKELETAL: Denies any back pain, neck pain or shoulder pain. Denies any gout, redness, limited activity. NEUROLOGIC: The patient denies any numbness, weakness, ataxia, transient ischemic attack or cerebrovascular accident. PSYCHOLOGIC: Denies any insomnia, ADD, OCD, bipolar, depression.   PHYSICAL EXAMINATION:  VITAL SIGNS: Temperature 98.4, pulse initially 125, but eventually 96, respiratory rate 18, blood pressure 139/98, pulse oximetry 97 to 98%.   GENERAL APPEARANCE: Not in acute distress. Answering questions appropriately. Well built and well nourished.   HEENT: Normocephalic, atraumatic. Pupils are equally reacting to light and accommodation. No conjunctival injection. No scleral icterus. No nasal stuffiness. Oropharynx no exudates.   NECK: Supple. No JVD. No carotid bruits. No thyromegaly.   LUNGS: Clear to auscultation bilaterally. No crackles. No wheezing. No rhonchi.   CARDIOVASCULAR: S1, S2 normal. Regular rate and rhythm. No murmurs. Point of maximal pulse is not displaced.   ABDOMEN: Soft. Bowel sounds are hypoactive, audibly only two quadrants. Minimal diffuse generalized tenderness is present, but no rebound tenderness. No masses felt. Midline scar from exploratory laparotomy has healed well.   NEUROLOGIC: Awake, alert and oriented x3. Motor and sensory are  grossly intact. Cranial nerves II through XII are grossly intact   SKIN: Warm, dry, no lesions. No jaundice.   EXTREMITIES: No edema or cyanosis. No clubbing.   BACK: No CVA tenderness. No kyphoscoliosis.   LABS AND IMAGING STUDIES: Glucose 98, sodium 133, potassium 3.1, chloride 95, CO2 23, BUN 8, creatinine 0.74, lipase 111, serum calcium 9.0, total protein 7.6, bilirubin total 0.5, ALT and AST within normal range. White count 39.1, hemoglobin 17.3, hematocrit 51.5, platelet count 244,000, eosinophils are elevated at 78.2. Urinalysis: amber color, hazy appearance. Negative glucose, negative bilirubin, 2+ ketones, blood 1+, protein 100 mg/dL, nitrite negative, leukocyte esterase trace, hyaline casts 4/LPF. Abdominal x-ray supine and erect is ordered and is pending.   ASSESSMENT AND PLAN: 59 year old African American male presenting to the ER with a chief complaint of four week history of abdominal pain associated with diarrhea and two day history of nausea and vomiting. Will be admitted with the following assessment and plan.  1. Acute gastroenteritis: Probably recurrence of eosinophilic gastroenteritis. Other differential: parasite infestation, malignancy or inflammatory bowel disorder. Admit to med surge floor. N.p.o. IV fluids. Proton pump inhibitor. Empiric antibiotics with Levaquin and Flagyl IV. Gastroenterology consult for possible endoscopy and biopsy for definitive diagnosis. The patient might be benefited with IV steroids but will consider talking to gastroenterology before starting him on steroids. Will check stool for ova parasite culture, blood and fecal fat. Will provide him with morphine on an as-needed basis for abdominal pain. Antinausea medication will be given.  2. Possible ileus versus small bowel obstruction. Abdomen x-ray, two view, is ordered which is pending at this time. The patient is n.p.o. If necessary, will decompress with NG tube. We will consider  surgical consult depending  on the x-ray result.  3. Hypokalemia. Replace in the IV fluids with potassium supplements.  4. Nicotine dependence. The patient was counseled to quit smoking. Will start him on nicotine patch.  5. Alcohol dependence. The patient was counseled to stop drinking alcohol. He might be benefited with outpatient alcohol rehab program. Will consider giving him Ativan on an as-needed basis for withdrawal. Will provide him with gastrointestinal prophylaxis and deep vein thrombosis prophylaxis.   The diagnosis and plan of care was discussed in detail with the patient and his wife at bedside. They both verbalized understanding of the plan.   TOTAL TIME SPENT ON THE ADMISSION: 60 minutes.  ____________________________ Nicholes Mango, MD ag:ap D: 05/21/2012 03:02:31 ET T: 05/21/2012 08:48:06 ET JOB#: [id number]  cc: Nicholes Mango, MD, <Dictator> Modesto Charon, MD Nicholes Mango MD ELECTRONICALLY SIGNED 05/30/2012 0:56

## 2014-10-25 NOTE — Consult Note (Signed)
PATIENT NAME:  [name], [name] MR#:  [id number] DATE OF BIRTH:  1956/06/03  DATE OF CONSULTATION:  05/21/2012  REFERRING PHYSICIAN:  Dr. Margaretmary Eddy  CONSULTING PHYSICIAN:  Dr. Herbie Baltimore Elliott/ Joelene Millin A. Jerelene Redden, ANP  PRIMARY CARE PHYSICIAN:  Patient denies.  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 Olevia Perches, gastroenterologist in [place]. The patient states he had been treated for 26 years with oral prednisone and could never get past 45 days off the medication without flares of his current symptoms of abdominal pain, diarrhea, nausea, and vomiting. The patient was taking 5 mg every other day until about August when he was told by a physician that he would likely no longer need to be on oral prednisone. The patient then developed spells of crampy, abdominal pain, intermittent diarrhea, nausea, and intermittent vomiting in October. He says he has been passing black, tarry stools over the last month. Appetite is down. Some weight loss. Baseline weight is 217 to 227, now down to 209 in one month.  The patient has been evaluated in the Emergency Room twice for this history, and had CT scans done in October, CT of the abdomen and pelvis done in October and November that showed diffuse bowel wall thickening, stable cystic structure in the kidney, and he was given course of Cipro and Flagyl on 11/05. He has been taking the medication without improvement and on 05/20/2012 he started vomiting food, could not keep anything down. He also reported decreased bowel movements, sensation that it was stopped up, and was hospitalized yesterday. Abdominal x-ray performed showed focal air-filled dilatation of the small bowel anastomosis in the left hemi-abdomen similar to recent prior CT, nonspecific. Laboratory studies revealed increase from WBC of 7.8 in October to now 47 with  eosinophils 30.6, potassium 3.1, sodium 133. Blood culture negative.  GI  has been asked to see the patient regarding further evaluation and management.   Currently the patient says he is feeling better. Has not really eaten much. Yesterday he had chicken broth with crackers, kept it down, but seven hours later he had vomiting. No hematemesis. He did have black, tarry, mushy stools at 3 or 4 in the morning. He had a dose of Pepto-Bismol a couple days ago, first and only  dose. He denies iron therapy or  antacids. He said he has never had a history of ulcers or GI bleed. The patient had a remote colonoscopy over 20 years ago. The patient denies any food allergies, no dysphagia, he has felt some fatigue, no fevers or chills, and believes that his symptoms are all related to his eosinophilic gastroenteritis and discontinuation of prednisone.    PAST MEDICAL HISTORY:  1. Eosinophilic gastroenteritis diagnosed by Dr. Olevia Perches in [place] with exploratory laparotomy and small bowel resection.  2. Gastroesophageal reflux disease.  3. History of chronic prednisone use, 26 years, recently discontinued approximately August.   PAST SURGICAL HISTORY: Exploratory laparotomy. The patient reports 2.5 to 3 feet of small bowel resection.   MEDICATIONS ON ARRIVAL:  1. Aleve 2 tablets daily over the last month for abdominal pain.  2. Omeprazole 20 mg once daily. He has been taking it regularly for years.   ALLERGIES: Penicillin causes laryngeal edema.   HABITS: Positive tobacco, 1 pack per day. Alcohol- admits three shots of bourbon every day along with at least 32 ounces of beer every day. He denies any  illicit drug use. The patient is separated from his third wife, has three children, one grandson. He has been living with his girlfriend of three years.   REVIEW OF SYSTEMS: 10 systems reviewed. Positive fatigue, weight loss, nausea, vomiting, intermittent crampy abdominal pain, diffuse, bilateral, melena, heartburn  is usually well controlled, no dysphagia, one dose of Pepto-Bismol yesterday, remaining 10 systems negative.   PHYSICAL EXAMINATION: VITAL SIGNS: Temperature 98.4, pulse 99, respirations 18, blood pressure 118/88, pulse oximetry on room air is 95%.   GENERAL: Middle-aged, well-appearing African American male resting in bed in no acute distress.   HEENT: Head is normocephalic. Conjunctivae pink. Sclerae anicteric. Oral mucosa is dry and intact. Tongue is a little coated.   NECK: Supple, trachea midline.   HEART: Heart tones S1, S2 without murmur, rub, or gallop.   LUNGS: Clear to auscultation  posteriorly, respirations eupneic.   ABDOMEN: Soft. Bowel sounds are present No tenderness on exam. No hepatosplenomegaly  or masses. Midline scar noted from laparotomy history.   RECTAL:  Digital rectal exam by me shows mucus, light brown, heme-positive stool, sphincter tone intact. No palpable masses.   EXTREMITIES: Lower extremities without edema, cyanosis, clubbing.   SKIN: Warm and dry without rash.   MUSCULOSKELETAL: No joint pain or swelling. Gait not evaluated. Good muscle tone.   PSYCH: Affect and mood relaxed, alert, oriented, pleasant.  LABORATORY DATA: Admission blood work notable for sodium 133, potassium 3.1, BUN 8, creatinine 0.74, glucose 98, lipase 111, calcium 9, albumin 3.9, total bilirubin 0.5, alkaline phosphatase 79, AST 19 ALT 19, WBC 39.1, hemoglobin 17.3. RDW 14.8, platelet count 244, monocytes 1.2, eosinophils 30.6, basophils 0.3. Blood culture: Negative growth. Urinalysis:  Amber, hazy, urine, 2+ ketones, 1+ blood, positive protein, WBCs two per high-power field, hyaline casts 4/lpf.   Labs from recent Emergency Room visits notable for AST of 92, ALT 116, WBC 7.8, hemoglobin 16.1, platelet count 147 on 04/28/2012.   RADIOLOGY: CT of the abdomen and pelvis with contrast performed 04/29/2012 showed diffuse wall thickening in the ascending and transverse and proximal  descending colon suggesting colitis. Urinary bladder with wall thickening anteriorly and surrounding inflammatory changes. Prostate gland enlarged and produces impression upon urinary bladder base. Moderate size fat-containing bilateral inguinal hernias.   CT of the abdomen and pelvis with contrast performed 05/12/2012 showed diffuse colonic wall thickening with relative sparing of the mid and distal rectosigmoid. Small amount of fluid in the left pericolic gutter. Small bowel shows no abnormality. Terminal ileum shows very mild wall thickening. Appendix is demonstrated and normal. Liver with scattered hypodensities compatible with cysts. There may be hemangioma adjacent to the gallbladder fundus in the left hepatic lobe. There are tiny stones within the gallbladder, stable. There is a 2.5-cm upper pole hypodensity in the left kidney. There are small bilateral inguinal hernias, small hiatal hernia, tiny fat-containing umbilical hernia. The lungs showed mild emphysematous changes and chronic thickening of the major fissure on the left. Mild superior endplate depression L1. Abdominal x-ray AP performed 05/21/2012 showed focal air-filled dilatation of small bowel anastomosis left hemidiaphragm similar to the prior CT.   IMPRESSION:  1. This patient reports history of gastroesophageal reflux disease and eosinophilic gastroenteritis for which he took prednisone for 26 years. The patient says he was never able to come off the prednisone for more than 45 days without return of the current symptoms that he presents with now- abdominal cramps, pain, nausea, vomiting, and diarrhea. Indeed, the patient did come off his prednisone approximately  August on recommendation of a physician. The patient states he actually has been seen in the Emergency Room for abdominal complaints and has not seen a primary care provider or a gastroenterologist for a long time. In the Emergency Room he was found to have isolated elevated liver  enzymes, and he presents with elevation in WBC and eosinophil counts. The possibility of eosinophilic gastroenteritis is certainly high given his history of small bowel surgery and past treatment. We will need to consider other etiologies for his GI complaints to include malignancy, ulcer, inflammatory bowel disease, infection.  2. History of elevated liver enzymes in a patient with alcohol abuse.  3. Abnormal CBC to consider hematology consult to rule out eosinophilia leukemia.   PLAN:  1. Recommend CIWA consideration on this patient.  2. Many abnormalities in the CT scan including enlarged prostate, would check PSA, consider urology consult. 3. For the bowel colitis and GI symptoms, I recommend EGD tomorrow, and if that is unremarkable colonoscopy on Monday. We will check stool for C. difficile already pending, and other bacteria cultures have been ordered. The patient has been unable to produce a sample. We will also check hepatitis A. B, C and HIV with history of elevated liver enzymes. Check sedimentation rate, CRP.  4. The patient is on Levaquin and Flagyl to cover for possible bacterial infection. Continue with Protonix IV.   This case was discussed with Dr. Vira Agar in collaboration of care. Further GI recommendations pending findings. Thank you for the consultation.    These services provided by Denice Paradise, ANP in collaborative agreement with Dr. Gaylyn Cheers.     ____________________________ Janalyn Harder. Jerelene Redden, ANP kam:bjt D: 05/21/2012 17:01:39 ET T: 05/21/2012 17:52:01 ET JOB#: [id number]  cc: Joelene Millin A. Jerelene Redden, ANP, <Dictator> Janalyn Harder. Sherlyn Hay, MSN, ANP-BC Adult Nurse Practitioner ELECTRONICALLY SIGNED 05/22/2012 9:25

## 2014-10-25 NOTE — Consult Note (Signed)
HEMATOLOGY followup - states diarrhea and vomiting is improved since starting steroids. No fevers. no bleeding symptoms. No new skin rash. patient is alert and oriented, NAD.           vitals - 98.1, 71, 18, 138/78           lungs - b/l good air entry           abd - soft, nontenderWBC 14.6, ANC 4300, AEC 8500, Hb 12, platelets 174K, Cr 27.53.   59 year old gentleman with long-standing history of eosinophilic gastroenteritis at least for last 20 years who has been on intermittent prednisone therapy which was totally discontinued in early 2013 currently admitted with recurrent vomiting and diarrhea. CT scan of the abdomen and pelvis on 11/05 reported diffuse bowel wall thickening throughout the colon suggestive of colitis, no other lymphadenopathy or hepatosplenomegaly reported. Also stool Hemoccult is positive. Patient has improved well after starting on low dose Solu-Medrol therapy. He had endoscopic evaluation. Given long-standing history of significant absolute eosinophilia, raises possibility of hypereosinophilic syndrome versus other etiology. Currently hepatitis panel, HIV, stool culture and stool ova and parasites are pending. Have also sent other workup including peripheral blood immunophenotyping study to rule out monoclonal B cell population, serum IgE, SIEP to evaluate for quantitative immunoglobulins, serum tryptase, serum B12 to see if it is elevated, BCR-ABL study to rule out possibility of chronic myeloid leukemia. Agree with ongoing treatment, if hypereosinophilia persists without improvement, may need to consider bone marrow biopsy and also check for FIP1A/PDGFRA FISH panel in the near future. Patient is being discharged today, will f/u as outpt in 3 weeks. Patient explained above, he is agreeable to this plan.   Electronic Signatures: Jonn Shingles (MD)  (Signed on [phone] 22:52)  Authored  Last Updated: 01-SWF-09 22:52 by Jonn Shingles (MD)

## 2014-10-25 NOTE — Consult Note (Signed)
Chief Complaint:   Subjective/Chief Complaint Continues to feel better. No nausea or abd pain. Diarrhea persists. WBC coming down on steroids. Wants to proceed with colonoscopy tomorrow.   VITAL SIGNS/ANCILLARY NOTES: **Vital Signs.:   17-Nov-13 04:07   Vital Signs Type Routine   Temperature Temperature (F) 98   Celsius 36.6   Temperature Source Oral   Pulse Pulse 70   Respirations Respirations 20   Systolic BP Systolic BP 106   Diastolic BP (mmHg) Diastolic BP (mmHg) 80   Mean BP 96   Pulse Ox % Pulse Ox % 96   Pulse Ox Activity Level  At rest   Oxygen Delivery Room Air/ 21 %   Brief Assessment:   Cardiac Regular    Respiratory clear BS    Gastrointestinal Normal   Lab Results: Routine Chem:  17-Nov-13 04:06    Magnesium, Serum  1.5 (1.8-2.4 THERAPEUTIC RANGE: 4-7 mg/dL TOXIC: > 10 mg/dL  -----------------------)   Glucose, Serum  136   BUN  4   Creatinine (comp) 0.73   Sodium, Serum 138   Potassium, Serum 4.0   Chloride, Serum 105   CO2, Serum 26   Calcium (Total), Serum  8.4   Anion Gap 7   Osmolality (calc) 275   eGFR (African American) >60   eGFR (Non-African American) >60 (eGFR values <58m/min/1.73 m2 may be an indication of chronic kidney disease (CKD). Calculated eGFR is useful in patients with stable renal function. The eGFR calculation will not be reliable in acutely ill patients when serum creatinine is changing rapidly. It is not useful in  patients on dialysis. The eGFR calculation may not be applicable to patients at the low and high extremes of body sizes, pregnant women, and vegetarians.)  Routine Hem:  17-Nov-13 04:06    WBC (CBC)  14.6   RBC (CBC)  4.10   Hemoglobin (CBC)  12.0   Hematocrit (CBC)  36.1   Platelet Count (CBC) 174   MCV 88   MCH 29.2   MCHC 33.2   RDW  14.6   Neutrophil % 29.7   Lymphocyte % 8.2   Monocyte % 3.3   Eosinophil % 58.6   Basophil % 0.2   Neutrophil # 4.3   Lymphocyte # 1.2   Monocyte # 0.5    Eosinophil #  8.5   Basophil # 0.0 (Result(s) reported on 24 May 2012 at 05:45AM.)   Assessment/Plan:  Assessment/Plan:   Assessment hypereosinophillic syndrome. On steroids. Colitis seen on X-ray    Plan Bowel prep this afternoon for colonoscopy tomorrow with Dr. EVira Agar Thanks.   Electronic Signatures: OVerdie Shire(MD)  (Signed [id number])  Authored: Chief Complaint, VITAL SIGNS/ANCILLARY NOTES, Brief Assessment, Lab Results, Assessment/Plan   Last Updated: 17-Nov-13 10:48 by OVerdie Shire(MD)

## 2014-10-25 NOTE — Discharge Summary (Signed)
PATIENT NAME:  [name], [name] MR#:  [id number] DATE OF BIRTH:  08/12/1955  DATE OF ADMISSION:  05/21/2012 DATE OF DISCHARGE:  05/25/2012  PRIMARY CARE PHYSICIAN:  [place]:  1. Follow up with Dr. Vira Agar, Gastroenterology, in two weeks. 2. Follow up with Dr. Ma Hillock, Hematology, in three weeks.   DISCHARGE MEDICATIONS:   1. Omeprazole 20 mg daily.  2. Prednisone 20 mg daily.  3. Magnesium oxide 400 mg daily.  4. Nicotine patch 21 mcg daily.   NOTE: Can stop taking Cipro and Flagyl.   DISCHARGE DIAGNOSES:  1. Eosinophilic gastroenteritis with nausea, vomiting, and diarrhea.  2. Eosinophilic syndrome.  3. Tobacco abuse.  4. Hypokalemia.  5. Hypomagnesemia.  6. Gastroesophageal reflux disease with gastritis, esophagitis, and duodenitis on endoscopy.  PROCEDURES DURING HOSPITALIZATION: An upper endoscopy done on 05/22/2012 by Dr. Vira Agar and colonoscopy done on 05/25/2012 by Dr. Vira Agar.   REASON FOR ADMISSION:  On 05/21/2012, the patient came in with generalized abdominal pain, nausea, vomiting, and diarrhea.   HISTORY OF PRESENT ILLNESS: The patient is a 59 year old man with history of gastroesophageal reflux disease, eosinophilic gastroenteritis. He was admitted to the hospital, was kept n.p.o., given IV fluid hydration, empiric Levaquin and Flagyl. A Gastrointestinal consultation was obtained.  LABORATORY, DIAGNOSTIC AND RADIOLOGICAL DATA: A urinalysis showed trace leukocyte esterase, 1+ blood, lipase 111. White blood cell count 39.1, hemoglobin 17.3, hematocrit 51.5, platelet count 244. Eosinophils number 30.6, percentage. 78.2. Glucose 98, BUN 8, creatinine 0.74, sodium 133, potassium 3.1, chloride 95, CO2 23, calcium 9.0. Liver function tests normal range. Blood cultures negative x2. Abdominal x-ray showed no definite evidence of obstruction. Focal air-filled dilation of the small bowel. Giardia negative. Fecal fat normal. Ova and parasites none. Stool  comprehensive negative. Occult blood positive. White blood cells in the stool none. Stool for C. difficile negative. Endoscopy biopsy was negative for dysplasia and malignancy, showed duodenitis with increased eosinophils, chronic gastritis negative for H. pylori. HIV negative. Sedimentation rate 1.0. C-reactive protein 17.4. PSA 0.8. Hepatitis A, B and C panel negative. LDL 95, HDL 26, triglycerides 99. White count on the 15th came down to 27.2. Chest x-ray: No acute disease in the chest. Magnesium on the 17th 1.5. White count on the 17th 14.6. Protein electrophoresis still pending. IgE in the serum elevated at 994. Vitamin B12 588. Tryptase result pending. Echocardiogram showed ejection fraction greater than 55%, left ventricular hypertrophy, trace mitral regurgitation. Endoscopy done on November 15th that showed grade B reflux esophagitis, gastritis and duodenitis. Colonoscopy showed two polyps resected and retrieved, internal hemorrhoids. Biopsies from the colon are still pending.   HOSPITAL COURSE PER PROBLEM LIST:  1. Eosinophilic gastroenteritis with nausea, vomiting, and diarrhea: The patient was empirically put on antibiotics, IV fluids, kept n.p.o. initially, then started on a liquid diet. IV steroids were started. He started feeling better after the IV steroids were started. GI did upper endoscopy and colonoscopy. At the time of discharge the patient was feeling well. Dr. Vira Agar recommended 20 mg of prednisone on a daily basis until following up in the office and then most likely will be on 10 mg lifelong after that. A follow-up appointment with Dr. Vira Agar in two weeks is needed. Colonoscopy biopsy is still pending at the time of discharge. The patient was having no abdominal pain. He did have diarrhea from the colonoscopy prep but was feeling much better than when he was admitted.  2. Eosinophilic syndrome: He was seen in consultation by  Hematology, Dr. Ma Hillock. Some of the testing that Dr. Ma Hillock  ordered is still pending, so I recommended a follow-up appointment with Dr. Ma Hillock.  3. Tobacco abuse: Smoking cessation advised. Nicotine patch applied.  4. Hypokalemia and hypomagnesemia: Potassium was replaced during the hospital stay in the IV fluids. Magnesium was replaced IV during the hospital stay and orally upon discharge.  5. Gastroesophageal reflux disease: He is on omeprazole and will continue that since the patient does have esophagitis, gastritis, and duodenitis. Hopefully, the steroids will help settle down the inflammation of the eosinophilic gastritis.   TIME SPENT ON DISCHARGE:  35 minutes.   ____________________________ Tana Conch. Leslye Peer, MD rjw:cbb D: 05/26/2012 14:14:40 ET T: 05/26/2012 14:41:20 ET JOB#: [id number]  cc: Tana Conch. Leslye Peer, MD, <Dictator> [place] SIGNED 06/08/2012 17:23

## 2014-10-25 NOTE — Consult Note (Signed)
Pt with severe mucosal inflammation with submucosal hemorrhage probably from repeated vomiting.  Bx taken in this area and stomach and esopahgus.  Electronic Signatures: Manya Silvas (MD)  (Signed on 15-Nov-13 12:22)  Authored  Last Updated: 15-Nov-13 12:22 by Manya Silvas (MD)

## 2014-10-25 NOTE — Consult Note (Signed)
Brief Consult Note: Diagnosis: Nausea/vomiting/diarrhea/abdominal pain hx of eospinophilic gastroenteritis, abnormal CT and CBC, DRE hemoccult positive, ETOH abuse.   Patient was seen by consultant.   Consult note dictated.   Comments: EGD in am. Hematology consult r/o eosinophilic leukemia, or other etiology for WBC 39, eos 30.6. Pt was on Prednisone 16m qod for 26 years until approx Aug. Abd symptoms started about/Oct. Abnormal CT showing colitis- DRE heme positive, will check ESR/CRP, colonoscopy on Mon to r/o malig, colitis, he is on Levaquin and Flagyl- to continue;   abnormal prostate/bladder-needs prostate evaluated-check PSA and consider urology consult. ETOH abuse-consider CIWA, I counseled pt to decrease use, isolated  elev liver enz-check Hep ABC, HIV, stool for C-diff-pending collection. Further gi recommendations pending. Case d/w Dr. EVira Agarin collaboration of care.  Electronic Signatures: MGershon Mussel(NP)  (Signed [id number])  Authored: Brief Consult Note   Last Updated: 14-Nov-13 17:17 by MGershon Mussel(NP)

## 2014-10-26 ENCOUNTER — Emergency Department
Admit: 2014-10-26 | Disposition: A | Discharge status: Home / Self Care | Payer: Self-pay | Admitting: Emergency Medicine

## 2014-11-29 ENCOUNTER — Emergency Department
Admission: EM | Admit: 2014-11-29 | Discharge: 2014-11-29 | Discharge status: Against Medical Advice | Payer: Commercial Managed Care - PPO | Attending: Emergency Medicine | Admitting: Emergency Medicine

## 2014-11-29 ENCOUNTER — Emergency Department (HOSPITAL_COMMUNITY)
Admission: EM | Admit: 2014-11-29 | Discharge: 2014-11-29 | Disposition: A | Discharge status: Home / Self Care | Payer: Commercial Managed Care - PPO | Attending: Emergency Medicine | Admitting: Emergency Medicine

## 2014-11-29 ENCOUNTER — Telehealth: Payer: Self-pay | Admitting: Emergency Medicine

## 2014-11-29 ENCOUNTER — Encounter (HOSPITAL_COMMUNITY): Payer: Self-pay | Admitting: *Deleted

## 2014-11-29 DIAGNOSIS — Z7982 Long term (current) use of aspirin: Secondary | ICD-10-CM | POA: Insufficient documentation

## 2014-11-29 DIAGNOSIS — Z880 Allergy status to penicillin: Secondary | ICD-10-CM | POA: Insufficient documentation

## 2014-11-29 DIAGNOSIS — R1110 Vomiting, unspecified: Secondary | ICD-10-CM | POA: Insufficient documentation

## 2014-11-29 DIAGNOSIS — Z79899 Other long term (current) drug therapy: Secondary | ICD-10-CM | POA: Insufficient documentation

## 2014-11-29 DIAGNOSIS — M1990 Unspecified osteoarthritis, unspecified site: Secondary | ICD-10-CM | POA: Insufficient documentation

## 2014-11-29 DIAGNOSIS — I10 Essential (primary) hypertension: Secondary | ICD-10-CM | POA: Insufficient documentation

## 2014-11-29 DIAGNOSIS — R109 Unspecified abdominal pain: Secondary | ICD-10-CM | POA: Insufficient documentation

## 2014-11-29 DIAGNOSIS — Z720 Tobacco use: Secondary | ICD-10-CM | POA: Insufficient documentation

## 2014-11-29 DIAGNOSIS — Z8781 Personal history of (healed) traumatic fracture: Secondary | ICD-10-CM | POA: Insufficient documentation

## 2014-11-29 DIAGNOSIS — D721 Eosinophilia, unspecified: Secondary | ICD-10-CM

## 2014-11-29 DIAGNOSIS — R112 Nausea with vomiting, unspecified: Secondary | ICD-10-CM | POA: Insufficient documentation

## 2014-11-29 DIAGNOSIS — R197 Diarrhea, unspecified: Secondary | ICD-10-CM | POA: Insufficient documentation

## 2014-11-29 DIAGNOSIS — K219 Gastro-esophageal reflux disease without esophagitis: Secondary | ICD-10-CM | POA: Insufficient documentation

## 2014-11-29 LAB — URINALYSIS COMPLETE WITH MICROSCOPIC (ARMC ONLY)
BILIRUBIN URINE: NEGATIVE
Bacteria, UA: NONE SEEN
Glucose, UA: NEGATIVE mg/dL
HGB URINE DIPSTICK: NEGATIVE
KETONES UR: NEGATIVE mg/dL
LEUKOCYTES UA: NEGATIVE
Nitrite: NEGATIVE
PH: 7 (ref 5.0–8.0)
PROTEIN: NEGATIVE mg/dL
SPECIFIC GRAVITY, URINE: 1.002 — AB (ref 1.005–1.030)
Squamous Epithelial / LPF: NONE SEEN

## 2014-11-29 LAB — COMPREHENSIVE METABOLIC PANEL
ALBUMIN: 4.2 g/dL (ref 3.5–5.0)
ALT: 51 U/L (ref 17–63)
AST: 50 U/L — ABNORMAL HIGH (ref 15–41)
Alkaline Phosphatase: 47 U/L (ref 38–126)
Anion gap: 9 (ref 5–15)
CHLORIDE: 105 mmol/L (ref 101–111)
CO2: 27 mmol/L (ref 22–32)
Calcium: 8.6 mg/dL — ABNORMAL LOW (ref 8.9–10.3)
Creatinine, Ser: 0.86 mg/dL (ref 0.61–1.24)
GFR calc Af Amer: >60 mL/min (ref >60)
GFR calc non Af Amer: >60 mL/min (ref >60)
Glucose, Bld: 92 mg/dL (ref 65–99)
Potassium: 3.5 mmol/L (ref 3.5–5.1)
Sodium: 141 mmol/L (ref 135–145)
TOTAL PROTEIN: 7.2 g/dL (ref 6.5–8.1)
Total Bilirubin: 0.6 mg/dL (ref 0.3–1.2)

## 2014-11-29 LAB — CBC WITH DIFFERENTIAL/PLATELET
Basophils Absolute: 0.1 10*3/uL (ref 0–0.1)
Basophils Relative: 2 %
EOS ABS: 2.4 10*3/uL — AB (ref 0–0.7)
Eosinophils Relative: 33 %
HEMATOCRIT: 46.8 % (ref 40.0–52.0)
Hemoglobin: 15.6 g/dL (ref 13.0–18.0)
Lymphocytes Relative: 26 %
Lymphs Abs: 1.9 10*3/uL (ref 1.0–3.6)
MCH: 30.2 pg (ref 26.0–34.0)
MCHC: 33.4 g/dL (ref 32.0–36.0)
MCV: 90.5 fL (ref 80.0–100.0)
MONOS PCT: 7 %
Monocytes Absolute: 0.5 10*3/uL (ref 0.2–1.0)
NEUTROS ABS: 2.5 10*3/uL (ref 1.4–6.5)
Neutrophils Relative %: 32 %
Platelets: 184 10*3/uL (ref 150–440)
RBC: 5.18 MIL/uL (ref 4.40–5.90)
RDW: 16.8 % — ABNORMAL HIGH (ref 11.5–14.5)
WBC: 7.4 10*3/uL (ref 3.8–10.6)

## 2014-11-29 LAB — LIPASE, BLOOD: LIPASE: 30 U/L (ref 22–51)

## 2014-11-29 MED ORDER — PREDNISONE 20 MG PO TABS: ORAL_TABLET | ORAL | Status: DC

## 2014-11-29 MED ORDER — ONDANSETRON 8 MG PO TBDP
8.0000 mg | ORAL_TABLET | Freq: Once | ORAL | Status: AC
  Administered 2014-11-29: 8 mg via ORAL

## 2014-11-29 MED ORDER — PREDNISONE 20 MG PO TABS
60.0000 mg | ORAL_TABLET | Freq: Once | ORAL | Status: AC
  Administered 2014-11-29: 60 mg via ORAL
  Filled 2014-11-29: qty 3

## 2014-11-29 NOTE — Discharge Instructions (Signed)
You have been given the first dose of POC right in the emergency department and a prescription for 12 day taper.  You've also been given a referral to a local GI specialist.  Please call and make an appointment today your several count is 33%.  your platelets are 184.  Your white count is normal at 7.7

## 2014-11-29 NOTE — ED Notes (Signed)
Pt states that he has a condition that causes abd pain and vomiting; pt states he was at [facility] this pm and had been waiting for more than 3 hrs; pt states that he does not want pain medications; pt states that he needs a Prednisone taper and he will be good to go

## 2014-11-29 NOTE — ED Provider Notes (Signed)
CSN: [id number]     Arrival date & time 11/29/14  0335 History   First MD Initiated Contact with Patient 11/29/14 0357     Chief Complaint  Patient presents with  . Abdominal Pain  . Emesis     (Consider location/radiation/quality/duration/timing/severity/associated sxs/prior Treatment) HPI Comments: With 30 year history of eosinophilia with exacerbations periodically states that yesterday he started having abdominal cramping, nausea and vomiting and diarrhea.  He's not had any vomiting .  Tonight, but does endorse abdominal cramping and nausea.  States he normally gets a sterile right taper and this lead.  His symptoms.  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 Denies any fever, chest pain, shortness of breath  Patient is a 59 y.o. male presenting with abdominal pain and vomiting. The history is provided by the patient.  Abdominal Pain Pain location:  Generalized Pain quality: cramping   Pain radiates to:  Does not radiate Pain severity:  Moderate Onset quality:  Gradual Duration:  2 days Timing:  Constant Progression:  Worsening Chronicity:  Chronic Context: previous surgery   Context: not diet changes, not eating, not laxative use and not recent illness   Context comment:  Chronic eosinophilia with frequent exacerbations Relieved by:  Nothing Worsened by:  Eating Ineffective treatments:  None tried Associated symptoms: diarrhea and nausea   Associated symptoms: no chest pain, no fever, no shortness of breath and no vomiting   Emesis Associated symptoms: abdominal pain and diarrhea     Past Medical History  Diagnosis Date  . Gastroenteritis   . Acid reflux   . Vertebral compression fracture     T11, L1  . History of stress test 2001    no ischemia  . Hypertension   . Tobacco abuse   . Alcohol abuse   . DDD (degenerative disc disease), lumbar   . Arthritis     "left pinky"  (10/17/2014)   Past Surgical History  Procedure Laterality Date  . Exploratory laparotomy w/ bowel resection  1980's  . Colon surgery     Family History  Problem Relation Age of Onset  . Diabetes Mother   . Hypertension Mother   . Stroke Father   . Heart failure Father   . Heart failure Other    History  Substance Use Topics  . Smoking status: Current Every Day Smoker -- 0.75 packs/day for 27 years    Types: Cigarettes  . Smokeless tobacco: Never Used  . Alcohol Use: 38.4 oz/week    5 Standard drinks or equivalent, 44 Shots of liquor, 15 Cans of beer per week     Comment: 4/11/23016 "I drink none to 1 pint of gin/day; 40oz beer when I'm drinking; occasional wine; probably 4 pints/wk; 4, 40oz beers/wk"    Review of Systems  Constitutional: Negative for fever.  Respiratory: Negative for shortness of breath.   Cardiovascular: Negative for chest pain.  Gastrointestinal: Positive for nausea, abdominal pain and diarrhea. Negative for vomiting and anal bleeding.  Skin: Negative for rash.  All other systems reviewed and are negative.     Allergies  Penicillins  Home Medications   Prior to Admission medications   Medication Sig Start Date End Date Taking? Authorizing Provider  aspirin 81 MG tablet Take 1 tablet (81 mg total) by mouth daily. 10/18/14   Domenic Polite, MD  methocarbamol (ROBAXIN) 500 MG tablet Take 2 tablets (1,000 mg total)  by mouth 4 (four) times daily as needed for muscle spasms (muscle spasm/pain). Patient not taking: Reported on 10/15/2014 06/18/13   Francine Graven, DO  nicotine (NICODERM CQ - DOSED IN MG/24 HOURS) 21 mg/24hr patch Place 1 patch (21 mg total) onto the skin daily. 10/18/14   Domenic Polite, MD  omeprazole (PRILOSEC) 20 MG capsule Take 20 mg by mouth daily.    Historical Provider, MD  oseltamivir (TAMIFLU) 75 MG capsule Take 1 capsule (75 mg total) by mouth 2 (two) times daily. For 3days 10/18/14   Domenic Polite, MD  predniSONE (DELTASONE) 20 MG  tablet 3 Tabs PO Days 1-3, then 2 tabs PO Days 4-6, then 1 tab PO Day 7-9, then Half Tab PO Day 10-12 11/29/14   Junius Creamer, NP  UNABLE TO FIND This note is to excuse Mr.Mccuiston from work 4/9 -4/17 due to medical illness requiring hospitalization 10/18/14   Domenic Polite, MD   BP 151/91 mmHg  Pulse 85  Temp(Src) 97.8 F (36.6 C) (Oral)  Resp 18  Ht 5\' 11"  (1.803 m)  Wt 217 lb (98.431 kg)  BMI 30.28 kg/m2  SpO2 98% Physical Exam  Constitutional: He appears well-developed and well-nourished.  HENT:  Head: Normocephalic.  Eyes: Pupils are equal, round, and reactive to light.  Neck: Normal range of motion.  Cardiovascular: Normal rate and regular rhythm.   Pulmonary/Chest: Effort normal and breath sounds normal.  Abdominal: Soft. He exhibits no distension. There is generalized tenderness. There is no rebound and no guarding.    Musculoskeletal: Normal range of motion.  Neurological: He is alert.  Skin: Skin is warm and dry.  Nursing note and vitals reviewed.   ED Course  Procedures (including critical care time) Labs Review Labs Reviewed - No data to display  Imaging Review No results found.   EKG Interpretation None     Patient given a prescription for 12 date seen.  Right taper.  He was given a first dose in the emergency room.  He was also given referral to local GI specialist.  His labs were reviewed that were drawn at [facility] earlier today.  He has a platelet count of 184.  Since the count of 33%, normal white count of 7.7, with adequate renal function MDM   Final diagnoses:  Eosinophilia         Junius Creamer, NP 11/29/14 2006  Rolland Porter, MD 12/07/14 [phone]

## 2014-11-29 NOTE — ED Notes (Signed)
Pt in with co mid abd pain since yest, hx of gastroenteritis.  States drank Gin to try to help pain without relief.  Does have vomiting and diarrhea, does not want pain meds requesting prednisone taper only.

## 2015-05-08 ENCOUNTER — Emergency Department
Admission: EM | Admit: 2015-05-08 | Discharge: 2015-05-08 | Disposition: A | Discharge status: Home / Self Care | Payer: Commercial Managed Care - PPO | Attending: Emergency Medicine | Admitting: Emergency Medicine

## 2015-05-08 ENCOUNTER — Encounter: Payer: Self-pay | Admitting: Emergency Medicine

## 2015-05-08 ENCOUNTER — Emergency Department: Payer: Commercial Managed Care - PPO

## 2015-05-08 DIAGNOSIS — S40012A Contusion of left shoulder, initial encounter: Secondary | ICD-10-CM | POA: Insufficient documentation

## 2015-05-08 DIAGNOSIS — I10 Essential (primary) hypertension: Secondary | ICD-10-CM | POA: Insufficient documentation

## 2015-05-08 DIAGNOSIS — Y9289 Other specified places as the place of occurrence of the external cause: Secondary | ICD-10-CM | POA: Insufficient documentation

## 2015-05-08 DIAGNOSIS — T07XXXA Unspecified multiple injuries, initial encounter: Secondary | ICD-10-CM

## 2015-05-08 DIAGNOSIS — Y9389 Activity, other specified: Secondary | ICD-10-CM | POA: Insufficient documentation

## 2015-05-08 DIAGNOSIS — S0083XA Contusion of other part of head, initial encounter: Secondary | ICD-10-CM | POA: Insufficient documentation

## 2015-05-08 DIAGNOSIS — W1839XA Other fall on same level, initial encounter: Secondary | ICD-10-CM | POA: Insufficient documentation

## 2015-05-08 DIAGNOSIS — Z79899 Other long term (current) drug therapy: Secondary | ICD-10-CM | POA: Insufficient documentation

## 2015-05-08 DIAGNOSIS — S63601A Unspecified sprain of right thumb, initial encounter: Secondary | ICD-10-CM | POA: Insufficient documentation

## 2015-05-08 DIAGNOSIS — Z880 Allergy status to penicillin: Secondary | ICD-10-CM | POA: Insufficient documentation

## 2015-05-08 DIAGNOSIS — Z720 Tobacco use: Secondary | ICD-10-CM | POA: Insufficient documentation

## 2015-05-08 DIAGNOSIS — T148XXA Other injury of unspecified body region, initial encounter: Secondary | ICD-10-CM

## 2015-05-08 DIAGNOSIS — Z7952 Long term (current) use of systemic steroids: Secondary | ICD-10-CM | POA: Insufficient documentation

## 2015-05-08 DIAGNOSIS — S63619A Unspecified sprain of unspecified finger, initial encounter: Secondary | ICD-10-CM

## 2015-05-08 DIAGNOSIS — Y998 Other external cause status: Secondary | ICD-10-CM | POA: Insufficient documentation

## 2015-05-08 MED ORDER — OXYCODONE-ACETAMINOPHEN 5-325 MG PO TABS: 1.0000 | ORAL_TABLET | Freq: Four times a day (QID) | ORAL | Status: DC | PRN

## 2015-05-08 NOTE — ED Notes (Signed)
AAOx3. Moving all extremities.  Ambulating with steady gait.  NAD.  Skin warm & dry.  Lynnda Shields, RN

## 2015-05-08 NOTE — ED Provider Notes (Signed)
[facility] Emergency Department Provider Note ____________________________________________  Time seen: Approximately 5:18 PM  I have reviewed the triage vital signs and the nursing notes.   HISTORY  Chief Complaint Hand Pain   HPI [name] is a 59 y.o. male who presents to the emergency department for evaluation of right hand pain and left clavicle pain he had a mechanical, non-syncopal fall last night while taking out the trash. He states that initially his "thumb was bent the wrong way." He states that he pulled it back into place. Today the hand and thumb have become extremely swollen and he is unable to bend the thumb. He denies loss of consciousness after the fall.   Past Medical History  Diagnosis Date  . Gastroenteritis   . Acid reflux   . Vertebral compression fracture (HCC)     T11, L1  . History of stress test 2001    no ischemia  . Hypertension   . Tobacco abuse   . Alcohol abuse   . DDD (degenerative disc disease), lumbar   . Arthritis     "left pinky" (10/17/2014)    Patient Active Problem List   Diagnosis Date Noted  . Influenza 10/18/2014  . Hypoxia 10/16/2014  . SOB (shortness of breath) 10/16/2014  . Cough 10/16/2014  . Fever 10/16/2014  . Sepsis ([place]) 10/16/2014  . Nausea and vomiting 10/16/2014  . Acid reflux   . DDD (degenerative disc disease), lumbar   . Hypertension   . Tobacco abuse   . Alcohol abuse   . Essential hypertension     Past Surgical History  Procedure Laterality Date  . Exploratory laparotomy w/ bowel resection  1980's  . Colon surgery      Current Outpatient Rx  Name  Route  Sig  Dispense  Refill  . aspirin 81 MG tablet   Oral   Take 1 tablet (81 mg total) by mouth daily.         . methocarbamol (ROBAXIN) 500 MG tablet   Oral   Take 2 tablets (1,000 mg total) by mouth 4 (four) times daily as needed for muscle spasms (muscle spasm/pain). Patient not taking: Reported on  10/15/2014   25 tablet   0   . nicotine (NICODERM CQ - DOSED IN MG/24 HOURS) 21 mg/24hr patch   Transdermal   Place 1 patch (21 mg total) onto the skin daily.   28 patch   0   . omeprazole (PRILOSEC) 20 MG capsule   Oral   Take 20 mg by mouth daily.         Marland Kitchen oseltamivir (TAMIFLU) 75 MG capsule   Oral   Take 1 capsule (75 mg total) by mouth 2 (two) times daily. For 3days   6 capsule   0   . oxyCODONE-acetaminophen (ROXICET) 5-325 MG tablet   Oral   Take 1 tablet by mouth every 6 (six) hours as needed.   9 tablet   0   . predniSONE (DELTASONE) 20 MG tablet      3 Tabs PO Days 1-3, then 2 tabs PO Days 4-6, then 1 tab PO Day 7-9, then Half Tab PO Day 10-12   20 tablet   0   . UNABLE TO FIND      This note is to excuse [name] from work 4/9 -4/17 due to medical illness requiring hospitalization   1 each   0     Allergies Penicillins  Family History  Problem Relation Age  of Onset  . Diabetes Mother   . Hypertension Mother   . Stroke Father   . Heart failure Father   . Heart failure Other     Social History Social History  Substance Use Topics  . Smoking status: Current Every Day Smoker -- 0.75 packs/day for 27 years    Types: Cigarettes  . Smokeless tobacco: Never Used  . Alcohol Use: 38.4 oz/week    5 Standard drinks or equivalent, 44 Shots of liquor, 15 Cans of beer per week     Comment: 4/11/23016 "I drink none to 1 pint of gin/day; 40oz beer when I'm drinking; occasional wine; probably 4 pints/wk; 4, 40oz beers/wk"    Review of Systems Constitutional: No recent illness. Eyes: No visual changes. ENT: No sore throat. Cardiovascular: Denies chest pain or palpitations. Respiratory: Denies shortness of breath. Gastrointestinal: No abdominal pain.  Genitourinary: Negative for dysuria. Musculoskeletal: Pain in right hand and left clavicle Skin: Negative for rash. Neurological: Negative for headaches, focal weakness or numbness. 10-point ROS  otherwise negative.  ____________________________________________   PHYSICAL EXAM:  VITAL SIGNS: ED Triage Vitals  Enc Vitals Group     BP 05/08/15 1703 151/103 mmHg     Pulse Rate 05/08/15 1703 100     Resp 05/08/15 1703 18     Temp 05/08/15 1703 98.6 F (37 C)     Temp Source 05/08/15 1703 Oral     SpO2 05/08/15 1703 99 %     Weight 05/08/15 1701 217 lb (98.431 kg)     Height 05/08/15 1701 5\' 11"  (1.803 m)     Head Cir --      Peak Flow --      Pain Score 05/08/15 1701 8     Pain Loc --      Pain Edu? --      Excl. in [place]? --     Constitutional: Alert and oriented. Well appearing and in no acute distress. Eyes: Conjunctivae are normal. EOMI. Head: Atraumatic. Nose: No congestion/rhinnorhea. Neck: No stridor.  Respiratory: Normal respiratory effort.   Musculoskeletal: Swelling and immobility noted to the right thumb at the PIP--swelling extends down to the MCP and into the thenar eminence. Tenderness over the left clavicle is noted with light palpation. Nexus criteria is negative and there is no focal midline tenderness of the spine. Neurologic:  Normal speech and language. No gross focal neurologic deficits are appreciated. Speech is normal. No gait instability. Skin:  Skin is warm, dry and intact. Atraumatic. Contusion noted to the left cheek/lateral eye area, contusion noted over the left clavicle area Psychiatric: Mood and affect are normal. Speech and behavior are normal.  ____________________________________________   LABS (all labs ordered are listed, but only abnormal results are displayed)  Labs Reviewed - No data to display ____________________________________________  RADIOLOGY  Right hand and left clavicle negative for acute bony abnormality.  I, Sherrie George, personally viewed and evaluated these images (plain radiographs) as part of my medical decision making.   ____________________________________________   PROCEDURES  Procedure(s) performed:    Aluminum foam splint applied to the right thumb by ER tech. Neurovascularly intact post application.   ____________________________________________   INITIAL IMPRESSION / ASSESSMENT AND PLAN / ED COURSE  Pertinent labs & imaging results that were available during my care of the patient were reviewed by me and considered in my medical decision making (see chart for details).  Patient was advised to follow-up with orthopedics. He was advised to return to the emergency  department for symptoms that change or worsen if unable to schedule an appointment. ____________________________________________   FINAL CLINICAL IMPRESSION(S) / ED DIAGNOSES  Final diagnoses:  Sprain of finger of right hand, initial encounter  Contusion, multiple sites  Contusion of clavicle, left, initial encounter       Victorino Dike, FNP 05/08/15 [place], MD 05/08/15 1911

## 2015-05-08 NOTE — Discharge Instructions (Signed)
Finger Sprain A finger sprain is a tear in one of the strong, fibrous tissues that connect the bones (ligaments) in your finger. The severity of the sprain depends on how much of the ligament is torn. The tear can be either partial or complete. CAUSES  Often, sprains are a result of a fall or accident. If you extend your hands to catch an object or to protect yourself, the force of the impact causes the fibers of your ligament to stretch too much. This excess tension causes the fibers of your ligament to tear. SYMPTOMS  You may have some loss of motion in your finger. Other symptoms include:  Bruising.  Tenderness.  Swelling. DIAGNOSIS  In order to diagnose finger sprain, your caregiver will physically examine your finger or thumb to determine how torn the ligament is. Your caregiver may also suggest an X-ray exam of your finger to make sure no bones are broken. TREATMENT  If your ligament is only partially torn, treatment usually involves keeping the finger in a fixed position (immobilization) for a short period. To do this, your caregiver will apply a bandage, cast, or splint to keep your finger from moving until it heals. For a partially torn ligament, the healing process usually takes 2 to 3 weeks. If your ligament is completely torn, you may need surgery to reconnect the ligament to the bone. After surgery a cast or splint will be applied and will need to stay on your finger or thumb for 4 to 6 weeks while your ligament heals. HOME CARE INSTRUCTIONS  Keep your injured finger elevated, when possible, to decrease swelling.  To ease pain and swelling, apply ice to your joint twice a day, for 2 to 3 days:  Put ice in a plastic bag.  Place a towel between your skin and the bag.  Leave the ice on for 15 minutes.  Only take over-the-counter or prescription medicine for pain as directed by your caregiver.  Do not wear rings on your injured finger.  Do not leave your finger unprotected  until pain and stiffness go away (usually 3 to 4 weeks).  Do not allow your cast or splint to get wet. Cover your cast or splint with a plastic bag when you shower or bathe. Do not swim.  Your caregiver may suggest special exercises for you to do during your recovery to prevent or limit permanent stiffness. SEEK IMMEDIATE MEDICAL CARE IF:  Your cast or splint becomes damaged.  Your pain becomes worse rather than better. MAKE SURE YOU:  Understand these instructions.  Will watch your condition.  Will get help right away if you are not doing well or get worse.   This information is not intended to replace advice given to you by your health care provider. Make sure you discuss any questions you have with your health care provider.   Document Released: 08/01/2004 Document Revised: 07/15/2014 Document Reviewed: 02/25/2011 Elsevier Interactive Patient Education 2016 [place] A contusion is a deep bruise. Contusions are the result of a blunt injury to tissues and muscle fibers under the skin. The injury causes bleeding under the skin. The skin overlying the contusion may turn blue, purple, or yellow. Minor injuries will give you a painless contusion, but more severe contusions may stay painful and swollen for a few weeks.  CAUSES  This condition is usually caused by a blow, trauma, or direct force to an area of the body. SYMPTOMS  Symptoms of this condition include:  Swelling  of the injured area.  Pain and tenderness in the injured area.  Discoloration. The area may have redness and then turn blue, purple, or yellow. DIAGNOSIS  This condition is diagnosed based on a physical exam and medical history. An X-ray, CT scan, or MRI may be needed to determine if there are any associated injuries, such as broken bones (fractures). TREATMENT  Specific treatment for this condition depends on what area of the body was injured. In general, the best treatment for a contusion is  resting, icing, applying pressure to (compression), and elevating the injured area. This is often called the RICE strategy. Over-the-counter anti-inflammatory medicines may also be recommended for pain control.  HOME CARE INSTRUCTIONS   Rest the injured area.  If directed, apply ice to the injured area:  Put ice in a plastic bag.  Place a towel between your skin and the bag.  Leave the ice on for 20 minutes, 2-3 times per day.  If directed, apply light compression to the injured area using an elastic bandage. Make sure the bandage is not wrapped too tightly. Remove and reapply the bandage as directed by your health care provider.  If possible, raise (elevate) the injured area above the level of your heart while you are sitting or lying down.  Take over-the-counter and prescription medicines only as told by your health care provider. SEEK MEDICAL CARE IF:  Your symptoms do not improve after several days of treatment.  Your symptoms get worse.  You have difficulty moving the injured area. SEEK IMMEDIATE MEDICAL CARE IF:   You have severe pain.  You have numbness in a hand or foot.  Your hand or foot turns pale or cold.   This information is not intended to replace advice given to you by your health care provider. Make sure you discuss any questions you have with your health care provider.   Document Released: 04/03/2005 Document Revised: 03/15/2015 Document Reviewed: 11/09/2014 Elsevier Interactive Patient Education [employer].

## 2015-05-08 NOTE — ED Notes (Signed)
States he fell injury to right thumb  Swelling noted

## 2015-05-08 NOTE — ED Notes (Signed)
States he fell yesterday ..having pain and swelling to right thumb and hand   States his thumb may have been dislocation yesterday. Also hit left side of head,abrasion to chest and right arm

## 2015-09-30 ENCOUNTER — Emergency Department: Payer: Self-pay

## 2015-09-30 ENCOUNTER — Encounter: Payer: Self-pay | Admitting: Emergency Medicine

## 2015-09-30 ENCOUNTER — Emergency Department
Admission: EM | Admit: 2015-09-30 | Discharge: 2015-09-30 | Disposition: A | Discharge status: Home / Self Care | Payer: Self-pay | Attending: Emergency Medicine | Admitting: Emergency Medicine

## 2015-09-30 DIAGNOSIS — I10 Essential (primary) hypertension: Secondary | ICD-10-CM | POA: Insufficient documentation

## 2015-09-30 DIAGNOSIS — R1031 Right lower quadrant pain: Secondary | ICD-10-CM

## 2015-09-30 DIAGNOSIS — K4021 Bilateral inguinal hernia, without obstruction or gangrene, recurrent: Secondary | ICD-10-CM | POA: Insufficient documentation

## 2015-09-30 DIAGNOSIS — Z79899 Other long term (current) drug therapy: Secondary | ICD-10-CM | POA: Insufficient documentation

## 2015-09-30 DIAGNOSIS — Z7982 Long term (current) use of aspirin: Secondary | ICD-10-CM | POA: Insufficient documentation

## 2015-09-30 DIAGNOSIS — F17210 Nicotine dependence, cigarettes, uncomplicated: Secondary | ICD-10-CM | POA: Insufficient documentation

## 2015-09-30 DIAGNOSIS — Z880 Allergy status to penicillin: Secondary | ICD-10-CM | POA: Insufficient documentation

## 2015-09-30 DIAGNOSIS — R1030 Lower abdominal pain, unspecified: Secondary | ICD-10-CM

## 2015-09-30 LAB — URINALYSIS COMPLETE WITH MICROSCOPIC (ARMC ONLY)
BILIRUBIN URINE: NEGATIVE
Bacteria, UA: NONE SEEN
Glucose, UA: NEGATIVE mg/dL
NITRITE: NEGATIVE
PROTEIN: NEGATIVE mg/dL
Specific Gravity, Urine: 1.018 (ref 1.005–1.030)
Squamous Epithelial / LPF: NONE SEEN
pH: 6 (ref 5.0–8.0)

## 2015-09-30 LAB — COMPREHENSIVE METABOLIC PANEL
ALT: 18 U/L (ref 17–63)
ANION GAP: 7 (ref 5–15)
AST: 30 U/L (ref 15–41)
Albumin: 4.5 g/dL (ref 3.5–5.0)
Alkaline Phosphatase: 51 U/L (ref 38–126)
BUN: 10 mg/dL (ref 6–20)
CHLORIDE: 103 mmol/L (ref 101–111)
CO2: 28 mmol/L (ref 22–32)
CREATININE: 0.92 mg/dL (ref 0.61–1.24)
Calcium: 8.4 mg/dL — ABNORMAL LOW (ref 8.9–10.3)
GFR calc Af Amer: >60 mL/min (ref >60)
Glucose, Bld: 109 mg/dL — ABNORMAL HIGH (ref 65–99)
Potassium: 3.4 mmol/L — ABNORMAL LOW (ref 3.5–5.1)
SODIUM: 138 mmol/L (ref 135–145)
Total Bilirubin: 0.5 mg/dL (ref 0.3–1.2)
Total Protein: 7.6 g/dL (ref 6.5–8.1)

## 2015-09-30 LAB — CBC
HCT: 42.1 % (ref 40.0–52.0)
Hemoglobin: 14.1 g/dL (ref 13.0–18.0)
MCH: 29.2 pg (ref 26.0–34.0)
MCHC: 33.5 g/dL (ref 32.0–36.0)
MCV: 87 fL (ref 80.0–100.0)
PLATELETS: 134 10*3/uL — AB (ref 150–440)
RBC: 4.84 MIL/uL (ref 4.40–5.90)
RDW: 14.6 % — AB (ref 11.5–14.5)
WBC: 4.8 10*3/uL (ref 3.8–10.6)

## 2015-09-30 LAB — LIPASE, BLOOD: LIPASE: 15 U/L (ref 11–51)

## 2015-09-30 MED ORDER — SODIUM CHLORIDE 0.9 % IV BOLUS (SEPSIS)
1000.0000 mL | Freq: Once | INTRAVENOUS | Status: AC
  Administered 2015-09-30: 1000 mL via INTRAVENOUS

## 2015-09-30 MED ORDER — FENTANYL CITRATE (PF) 100 MCG/2ML IJ SOLN
50.0000 ug | Freq: Once | INTRAMUSCULAR | Status: AC
  Administered 2015-09-30: 50 ug via INTRAVENOUS
  Filled 2015-09-30: qty 2

## 2015-09-30 MED ORDER — OXYCODONE-ACETAMINOPHEN 5-325 MG PO TABS: 1.0000 | ORAL_TABLET | Freq: Four times a day (QID) | ORAL | Status: DC | PRN

## 2015-09-30 NOTE — Discharge Instructions (Signed)
Please make an appointment with your primary care physician to evaluate your high blood pressure.  Please make a follow-up appointment with Dr. Adonis Huguenin to discuss elective hernia surgery.  Refrain from lifting anything greater than 15 pounds or significant bearing down.  Return to the emergency department if you develop severe pain, swelling, fever, nausea vomiting or diarrhea, or any other symptoms concerning to you.

## 2015-09-30 NOTE — ED Notes (Addendum)
Ice applied and pt placed in trendelenburg.

## 2015-09-30 NOTE — ED Notes (Signed)
Sudden R lower abd pain x 30 minutes. States history bilateral hernias.

## 2015-09-30 NOTE — ED Provider Notes (Signed)
[facility] Emergency Department Provider Note  ____________________________________________  Time seen: Approximately 8:20 AM  I have reviewed the triage vital signs and the nursing notes.   HISTORY  Chief Complaint Abdominal Pain    HPI [name] is a 60 y.o. 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 Patient was walking his dog this morning when she lurched and he pulled back, developing acute onset of severe right groin pain. He denies any mass, swelling, nausea vomiting or diarrhea. No fever or chills. Pain has slightly improved with laying down.   Past Medical History  Diagnosis Date  . Gastroenteritis   . Acid reflux   . Vertebral compression fracture (HCC)     T11, L1  . History of stress test 2001    no ischemia  . Hypertension   . Tobacco abuse   . Alcohol abuse   . DDD (degenerative disc disease), lumbar   . Arthritis     "left pinky" (10/17/2014)    Patient Active Problem List   Diagnosis Date Noted  . Influenza 10/18/2014  . Hypoxia 10/16/2014  . SOB (shortness of breath) 10/16/2014  . Cough 10/16/2014  . Fever 10/16/2014  . Sepsis ([place]) 10/16/2014  . Nausea and vomiting 10/16/2014  . Acid reflux   . DDD (degenerative disc disease), lumbar   . Hypertension   . Tobacco abuse   . Alcohol abuse   . Essential hypertension     Past Surgical History  Procedure Laterality Date  . Exploratory laparotomy w/ bowel resection  1980's  . Colon surgery      Current Outpatient Rx  Name  Route  Sig  Dispense  Refill  . omeprazole (PRILOSEC) 20 MG capsule   Oral   Take 20 mg by mouth daily.         Marland Kitchen tetrahydrozoline-zinc (VISINE-AC) 0.05-0.25 % ophthalmic solution   Both Eyes   Place 1 drop into both eyes 2 (two) times daily as needed (for allergies.).         Marland Kitchen aspirin 81 MG  tablet   Oral   Take 1 tablet (81 mg total) by mouth daily.         . methocarbamol (ROBAXIN) 500 MG tablet   Oral   Take 2 tablets (1,000 mg total) by mouth 4 (four) times daily as needed for muscle spasms (muscle spasm/pain). Patient not taking: Reported on 10/15/2014   25 tablet   0   . nicotine (NICODERM CQ - DOSED IN MG/24 HOURS) 21 mg/24hr patch   Transdermal   Place 1 patch (21 mg total) onto the skin daily.   28 patch   0   . oseltamivir (TAMIFLU) 75 MG capsule   Oral   Take 1 capsule (75 mg total) by mouth 2 (two) times daily. For 3days   6 capsule   0   . oxyCODONE-acetaminophen (ROXICET) 5-325 MG tablet   Oral   Take 1 tablet by mouth every 6 (six) hours as needed.   6 tablet   0   . predniSONE (DELTASONE) 20 MG tablet      3 Tabs PO Days 1-3, then 2 tabs PO Days 4-6, then 1 tab PO Day 7-9, then Half Tab PO Day 10-12   20 tablet   0   . UNABLE TO FIND      This note is to excuse [name] from work  4/9 -4/17 due to medical illness requiring hospitalization   1 each   0     Allergies Penicillins  Family History  Problem Relation Age of Onset  . Diabetes Mother   . Hypertension Mother   . Stroke Father   . Heart failure Father   . Heart failure Other     Social History Social History  Substance Use Topics  . Smoking status: Current Every Day Smoker -- 0.50 packs/day for 27 years    Types: Cigarettes  . Smokeless tobacco: Never Used  . Alcohol Use: 38.4 oz/week    15 Cans of beer, 44 Shots of liquor, 5 Standard drinks or equivalent per week     Comment: 4/11/23016 "I drink none to 1 pint of gin/day; 40oz beer when I'm drinking; occasional wine; probably 4 pints/wk; 4, 40oz beers/wk"    Review of Systems Constitutional: No fever/chills. No lightheadedness or syncope. Eyes: No visual changes. ENT: No sore throat. Cardiovascular: Denies chest pain, palpitations. Respiratory: Denies shortness of breath.  No cough. Gastrointestinal: No  abdominal pain.  No nausea, no vomiting.  No diarrhea.  No constipation. Genitourinary: Negative for dysuria. Positive right groin pain. Negative testicular swelling or pain. Negative testicular mass. Negative penile discharge. Musculoskeletal: Negative for back pain. Skin: Negative for rash. Neurological: Negative for headaches, focal weakness or numbness.  10-point ROS otherwise negative.  ____________________________________________   PHYSICAL EXAM:  VITAL SIGNS: ED Triage Vitals  Enc Vitals Group     BP 09/30/15 0748 174/115 mmHg     Pulse Rate 09/30/15 0748 110     Resp 09/30/15 0748 20     Temp 09/30/15 0748 98.1 F (36.7 C)     Temp src --      SpO2 09/30/15 0748 100 %     Weight 09/30/15 0748 217 lb (98.431 kg)     Height 09/30/15 0748 5\' 11"  (1.803 m)     Head Cir --      Peak Flow --      Pain Score 09/30/15 0750 7     Pain Loc --      Pain Edu? --      Excl. in [place]? --     Constitutional: Alert and oriented. Well appearing and in no acute distress. Answer question appropriately. Eyes: Conjunctivae are normal.  EOMI. No scleral icterus. Head: Atraumatic. Nose: No congestion/rhinnorhea. Mouth/Throat: Mucous membranes are moist.  Neck: No stridor.  Supple.   Cardiovascular: Normal rate, regular rhythm. No murmurs, rubs or gallops.  Respiratory: Normal respiratory effort.  No retractions. Lungs CTAB.  No wheezes, rales or ronchi. Gastrointestinal: Abdomen is overweight, soft and nontender. No distention. No guarding or rebound. No peritoneal signs. Genitourinary: Normal-appearing penis without lesions or discharge. Bilaterally descended testicles with normal lie, left lower than right. No tenderness to palpation or palpable masses on the testicles. Left inguinal canal is clear and without pain. Right inguinal canal is full with tenderness to palpation. Musculoskeletal: No LE edema.  Neurologic:  Normal speech and language. No gross focal neurologic deficits are  appreciated.  Skin:  Skin is warm, dry and intact. No rash noted. Psychiatric: Mood and affect are normal. Speech and behavior are normal.  Normal judgement.  ____________________________________________   LABS (all labs ordered are listed, but only abnormal results are displayed)  Labs Reviewed  CBC - Abnormal; Notable for the following:    RDW 14.6 (*)    Platelets 134 (*)    All other components within normal limits  COMPREHENSIVE METABOLIC PANEL - Abnormal; Notable for the following:    Potassium 3.4 (*)    Glucose, Bld 109 (*)    Calcium 8.4 (*)    All other components within normal limits  URINALYSIS COMPLETEWITH MICROSCOPIC ([facility] ONLY) - Abnormal; Notable for the following:    Color, Urine YELLOW (*)    APPearance CLEAR (*)    Ketones, ur TRACE (*)    Hgb urine dipstick 1+ (*)    Leukocytes, UA 1+ (*)    All other components within normal limits  LIPASE, BLOOD   ____________________________________________  EKG  Not indicated ____________________________________________  RADIOLOGY  US Scrotum  09/30/2015  CLINICAL DATA:  60 year old male with right inguinal groin pain EXAM: SCROTAL ULTRASOUND DOPPLER ULTRASOUND OF THE TESTICLES TECHNIQUE: Complete ultrasound examination of the testicles, epididymis, and other scrotal structures was performed. Color and spectral Doppler ultrasound were also utilized to evaluate blood flow to the testicles. COMPARISON:  Prior CT abdomen/ pelvis 05/14/2013 FINDINGS: Right testicle Measurements: 4.2 x 3.0 x 3.4 cm. No mass or microlithiasis visualized. Left testicle Measurements: 4.8 x 2.3 x 3.1 cm. No mass or microlithiasis visualized. Right epididymis:  Normal in size and appearance. Left epididymis: Normal in size and appearance. Small 3 mm sonographically simple epididymal cyst. Pulsed Doppler interrogation of both testes demonstrates normal low resistance arterial and venous waveforms bilaterally. Hydrocele:  None visualized.  Varicocele:  None visualized. Other: Peristalsing bowel noted in the bilateral inguinal regions slightly larger on the right than the left consistent with bilateral inguinal hernias. Additionally, there is a fluid containing structure with the muscular wall herniating into the right inguinal canal almost certainly representing the bladder. IMPRESSION: 1. Right inguinal hernia containing urinary bladder and peristalsing bowel. 2. Smaller left inguinal hernia containing peristalsing bowel. 3. Normal sonographic appearance of the testicles. No evidence of torsion. 4. Incidentally noted 3 mm left simple epididymal cyst. Electronically Signed   By: Jacqulynn Cadet M.D.   On: 09/30/2015 10:07   [place] Art/ven Flow Abd Pelv Doppler  09/30/2015  CLINICAL DATA:  60 year old male with right inguinal groin pain EXAM: SCROTAL ULTRASOUND DOPPLER ULTRASOUND OF THE TESTICLES TECHNIQUE: Complete ultrasound examination of the testicles, epididymis, and other scrotal structures was performed. Color and spectral Doppler ultrasound were also utilized to evaluate blood flow to the testicles. COMPARISON:  Prior CT abdomen/ pelvis 05/14/2013 FINDINGS: Right testicle Measurements: 4.2 x 3.0 x 3.4 cm. No mass or microlithiasis visualized. Left testicle Measurements: 4.8 x 2.3 x 3.1 cm. No mass or microlithiasis visualized. Right epididymis:  Normal in size and appearance. Left epididymis: Normal in size and appearance. Small 3 mm sonographically simple epididymal cyst. Pulsed Doppler interrogation of both testes demonstrates normal low resistance arterial and venous waveforms bilaterally. Hydrocele:  None visualized. Varicocele:  None visualized. Other: Peristalsing bowel noted in the bilateral inguinal regions slightly larger on the right than the left consistent with bilateral inguinal hernias. Additionally, there is a fluid containing structure with the muscular wall herniating into the right inguinal canal almost certainly representing  the bladder. IMPRESSION: 1. Right inguinal hernia containing urinary bladder and peristalsing bowel. 2. Smaller left inguinal hernia containing peristalsing bowel. 3. Normal sonographic appearance of the testicles. No evidence of torsion. 4. Incidentally noted 3 mm left simple epididymal cyst. Electronically Signed   By: Jacqulynn Cadet M.D.   On: 09/30/2015 10:07    ____________________________________________   PROCEDURES  Procedure(s) performed: None  Critical Care performed: No ____________________________________________   INITIAL IMPRESSION / ASSESSMENT AND  PLAN / ED COURSE  Pertinent labs & imaging results that were available during my care of the patient were reviewed by me and considered in my medical decision making (see chart for details).  60 y.o. male with known right inguinal hernia which she has not had surgically addressed presenting with acute onset of right inguinal pain after bearing down. On my exam, the patient does have reproducible pain with examination of his right inguinal canal. He likely has a hernia and we will attempt to reduce it with reverse Trendelenburg and ice. We will get an ultrasound to evaluate for incarceration. The patient does have a surgical history in the abdomen but no acute abdominal symptoms. Other possibilities although much less likely as there is no clinical correlation include testicular torsion, epididymitis, or symptomatic spermatocele.  ----------------------------------------- 10:33 AM on 09/30/2015 -----------------------------------------  The patient's ultrasound shows right and left inguinal hernias. The right inguinal hernia is larger and contains bowel as well as bladder. The patient overall does not have a fever or an elevated white blood cell count. I am attempting to conservatively reduce the hernia, but I will have the general surgeon also evaluate the patient in the emergency department. Final disposition depending on the  patient's symptoms as well as surgical consultation.  ----------------------------------------- 11:01 AM on 09/30/2015 -----------------------------------------  The patient's pain has significantly improved throughout his ED course. He has been seen and evaluated by Dr. Adonis Huguenin of general surgery who feels that the patient is stable for discharge and will return to the clinic for elective outpatient surgery evaluation. ____________________________________________  FINAL CLINICAL IMPRESSION(S) / ED DIAGNOSES  Final diagnoses:  Bilateral recurrent inguinal hernia without obstruction or gangrene  Essential hypertension      NEW MEDICATIONS STARTED DURING THIS VISIT:  New Prescriptions   OXYCODONE-ACETAMINOPHEN (ROXICET) 5-325 MG TABLET    Take 1 tablet by mouth every 6 (six) hours as needed.     Eula Listen, MD 09/30/15 1102

## 2015-09-30 NOTE — Consult Note (Signed)
Patient ID: [name], male   DOB: 10/14/1955, 60 y.o.   MRN: [id number]  CC: HERNIA  HPI [name] is a 60 y.o. male presents emergency room for evaluation of groin pain. He states he was walking his dog earlier and had a sudden jerk. After which has had an onset of abdominal pain and his pelvis. He has a known history of an inguinal hernia been diagnosed for many years. Discussed the first was ever cause him any pain though. At the time of my consultation the hernia had been reduced by the emergency department. Patient denies any fevers, chills, nausea, vomiting, diarrhea, constipation, chest pain, shortness of breath. He states he's feeling much better than when he presented to the emergency department. When he did have the pain prior to reduction he states it was sharp and throbbing and primarily in his right groin.  HPI  Past Medical History  Diagnosis Date  . Gastroenteritis   . Acid reflux   . Vertebral compression fracture (HCC)     T11, L1  . History of stress test 2001    no ischemia  . Hypertension   . Tobacco abuse   . Alcohol abuse   . DDD (degenerative disc disease), lumbar   . Arthritis     "left pinky" (10/17/2014)    Past Surgical History  Procedure Laterality Date  . Exploratory laparotomy w/ bowel resection  1980's  . Colon surgery      Family History  Problem Relation Age of Onset  . Diabetes Mother   . Hypertension Mother   . Stroke Father   . Heart failure Father   . Heart failure Other     Social History Social History  Substance Use Topics  . Smoking status: Current Every Day Smoker -- 0.50 packs/day for 27 years    Types: Cigarettes  . Smokeless tobacco: Never Used  . Alcohol Use: 38.4 oz/week    15 Cans of beer, 44 Shots of liquor, 5 Standard drinks or equivalent per week     Comment: 4/11/23016 "I drink none to 1 pint of gin/day; 40oz beer when I'm drinking; occasional wine; probably 4 pints/wk; 4, 40oz beers/wk"     Allergies  Allergen Reactions  . Penicillins Anaphylaxis    Has patient had a PCN reaction causing immediate rash, facial/tongue/throat swelling, SOB or lightheadedness with hypotension: Yes Has patient had a PCN reaction causing severe rash involving mucus membranes or skin necrosis: No Has patient had a PCN reaction that required hospitalization Yes Has patient had a PCN reaction occurring within the last 10 years: No If all of the above answers are "NO", then may proceed with Cephalosporin use.    No current facility-administered medications for this encounter.   Current Outpatient Prescriptions  Medication Sig Dispense Refill  . omeprazole (PRILOSEC) 20 MG capsule Take 20 mg by mouth daily.    Marland Kitchen tetrahydrozoline-zinc (VISINE-AC) 0.05-0.25 % ophthalmic solution Place 1 drop into both eyes 2 (two) times daily as needed (for allergies.).    Marland Kitchen aspirin 81 MG tablet Take 1 tablet (81 mg total) by mouth daily.    . methocarbamol (ROBAXIN) 500 MG tablet Take 2 tablets (1,000 mg total) by mouth 4 (four) times daily as needed for muscle spasms (muscle spasm/pain). (Patient not taking: Reported on 10/15/2014) 25 tablet 0  . nicotine (NICODERM CQ - DOSED IN MG/24 HOURS) 21 mg/24hr patch Place 1 patch (21 mg total) onto the skin daily. 28 patch 0  . oseltamivir (  TAMIFLU) 75 MG capsule Take 1 capsule (75 mg total) by mouth 2 (two) times daily. For 3days 6 capsule 0  . oxyCODONE-acetaminophen (ROXICET) 5-325 MG tablet Take 1 tablet by mouth every 6 (six) hours as needed. 9 tablet 0  . predniSONE (DELTASONE) 20 MG tablet 3 Tabs PO Days 1-3, then 2 tabs PO Days 4-6, then 1 tab PO Day 7-9, then Half Tab PO Day 10-12 20 tablet 0  . UNABLE TO FIND This note is to excuse Mr.[name] from work 4/9 -4/17 due to medical illness requiring hospitalization 1 each 0     Review of Systems A Multi-point review of systems was asked and was negative except for the findings documented in the history of present  illness  Physical Exam Blood pressure 168/108, pulse 88, temperature 98.1 F (36.7 C), resp. rate 18, height 5\' 11"  (1.803 m), weight 98.431 kg (217 lb), SpO2 100 %. CONSTITUTIONAL: No acute distress. EYES: Pupils are equal, round, and reactive to light, Sclera are non-icteric. EARS, NOSE, MOUTH AND THROAT: The oropharynx is clear. The oral mucosa is pink and moist. Hearing is intact to voice. LYMPH NODES:  Lymph nodes in the neck are normal. RESPIRATORY:  Lungs are clear. There is normal respiratory effort, with equal breath sounds bilaterally, and without pathologic use of accessory muscles. CARDIOVASCULAR: Heart is regular without murmurs, gallops, or rubs. GI: The abdomen is soft, nontender, and nondistended. There are no palpable masses. There is no hepatosplenomegaly. There are normal bowel sounds in all quadrants. Well-healed prior midline incision. Palpable defects in bilateral groins on Valsalva that are soft and easily reducible. GU: Rectal deferred.   MUSCULOSKELETAL: Normal muscle strength and tone. No cyanosis or edema.   SKIN: Turgor is good and there are no pathologic skin lesions or ulcers. NEUROLOGIC: Motor and sensation is grossly normal. Cranial nerves are grossly intact. PSYCH:  Oriented to person, place and time. Affect is normal.  Data Reviewed Ultrasound obtained by the emergency department reviewed showing evidence of bilateral inguinal hernias. Labs obtained by the ER all within normal limits except for a hypokalemia at 3.4. I have personally reviewed the patient's imaging, laboratory findings and medical records.    Assessment    Bilateral inguinal hernias and uncontrolled hypertension    Plan    Bilateral inguinal hernias: Discussed with patient that they are fully reduced right now. Discussed the signs and symptoms of incarceration or straining relation and return to emergency department immediately should they occur. Otherwise discussed that these could be  electively repaired same time laparoscopically should he desire to do so. Patient voiced understanding and is agreeable for outpatient follow-up. He can follow-up in the surgery clinic at his convenience to discuss elective repair.  Hypertension: Discussed with patient that he has very high blood pressure in the emergency department today. He would need to ensure that his blood pressure is under better control prior to any elective surgical intervention. He voiced understanding and states he'll follow up with a primary care provider for evaluation of his blood pressure.     Time spent with the patient was 30 minutes, with more than 50% of the time spent in face-to-face education, counseling and care coordination.     Clayburn Pert, MD FACS General Surgeon 09/30/2015, 10:59 AM

## 2015-09-30 NOTE — ED Notes (Signed)
Pt transported to ultrasound.

## 2016-02-24 ENCOUNTER — Emergency Department
Admission: EM | Admit: 2016-02-24 | Discharge: 2016-02-24 | Disposition: A | Discharge status: Home / Self Care | Payer: Self-pay | Attending: Emergency Medicine | Admitting: Emergency Medicine

## 2016-02-24 ENCOUNTER — Emergency Department: Payer: Self-pay

## 2016-02-24 ENCOUNTER — Encounter: Payer: Self-pay | Admitting: Emergency Medicine

## 2016-02-24 DIAGNOSIS — Z79899 Other long term (current) drug therapy: Secondary | ICD-10-CM | POA: Insufficient documentation

## 2016-02-24 DIAGNOSIS — K5282 Eosinophilic colitis: Secondary | ICD-10-CM | POA: Insufficient documentation

## 2016-02-24 DIAGNOSIS — R109 Unspecified abdominal pain: Secondary | ICD-10-CM

## 2016-02-24 DIAGNOSIS — F17210 Nicotine dependence, cigarettes, uncomplicated: Secondary | ICD-10-CM | POA: Insufficient documentation

## 2016-02-24 DIAGNOSIS — Z7982 Long term (current) use of aspirin: Secondary | ICD-10-CM | POA: Insufficient documentation

## 2016-02-24 DIAGNOSIS — I10 Essential (primary) hypertension: Secondary | ICD-10-CM | POA: Insufficient documentation

## 2016-02-24 LAB — LIPASE, BLOOD: Lipase: 20 U/L (ref 11–51)

## 2016-02-24 LAB — COMPREHENSIVE METABOLIC PANEL
ALBUMIN: 4.3 g/dL (ref 3.5–5.0)
ALK PHOS: 67 U/L (ref 38–126)
ALT: 22 U/L (ref 17–63)
ANION GAP: 9 (ref 5–15)
AST: 29 U/L (ref 15–41)
BUN: <5 mg/dL — ABNORMAL LOW (ref 6–20)
CALCIUM: 8.8 mg/dL — AB (ref 8.9–10.3)
CO2: 25 mmol/L (ref 22–32)
Chloride: 101 mmol/L (ref 101–111)
Creatinine, Ser: 0.8 mg/dL (ref 0.61–1.24)
GFR calc non Af Amer: >60 mL/min (ref >60)
GLUCOSE: 105 mg/dL — AB (ref 65–99)
POTASSIUM: 3.4 mmol/L — AB (ref 3.5–5.1)
SODIUM: 135 mmol/L (ref 135–145)
TOTAL PROTEIN: 7.2 g/dL (ref 6.5–8.1)
Total Bilirubin: 0.5 mg/dL (ref 0.3–1.2)

## 2016-02-24 LAB — URINALYSIS COMPLETE WITH MICROSCOPIC (ARMC ONLY)
BACTERIA UA: NONE SEEN
Bilirubin Urine: NEGATIVE
Glucose, UA: NEGATIVE mg/dL
Ketones, ur: NEGATIVE mg/dL
Leukocytes, UA: NEGATIVE
NITRITE: NEGATIVE
PROTEIN: 30 mg/dL — AB
SPECIFIC GRAVITY, URINE: 1.004 — AB (ref 1.005–1.030)
Squamous Epithelial / LPF: NONE SEEN
pH: 6 (ref 5.0–8.0)

## 2016-02-24 LAB — CBC
HEMATOCRIT: 46.8 % (ref 40.0–52.0)
HEMOGLOBIN: 16.2 g/dL (ref 13.0–18.0)
MCH: 31 pg (ref 26.0–34.0)
MCHC: 34.6 g/dL (ref 32.0–36.0)
MCV: 89.5 fL (ref 80.0–100.0)
Platelets: 154 10*3/uL (ref 150–440)
RBC: 5.23 MIL/uL (ref 4.40–5.90)
RDW: 15.8 % — ABNORMAL HIGH (ref 11.5–14.5)
WBC: 11.3 10*3/uL — ABNORMAL HIGH (ref 3.8–10.6)

## 2016-02-24 MED ORDER — PREDNISONE 10 MG (21) PO TBPK: 10.0000 mg | ORAL_TABLET | Freq: Every day | ORAL | 0 refills | Status: DC

## 2016-02-24 MED ORDER — SODIUM CHLORIDE 0.9 % IV BOLUS (SEPSIS)
1000.0000 mL | Freq: Once | INTRAVENOUS | Status: AC
  Administered 2016-02-24: 1000 mL via INTRAVENOUS

## 2016-02-24 MED ORDER — DIATRIZOATE MEGLUMINE & SODIUM 66-10 % PO SOLN
15.0000 mL | Freq: Once | ORAL | Status: AC
  Administered 2016-02-24: 15 mL via ORAL

## 2016-02-24 MED ORDER — ONDANSETRON HCL 4 MG/2ML IJ SOLN
4.0000 mg | Freq: Once | INTRAMUSCULAR | Status: AC
  Administered 2016-02-24: 4 mg via INTRAVENOUS
  Filled 2016-02-24: qty 2

## 2016-02-24 MED ORDER — MORPHINE SULFATE (PF) 4 MG/ML IV SOLN
4.0000 mg | Freq: Once | INTRAVENOUS | Status: AC
  Administered 2016-02-24: 4 mg via INTRAVENOUS
  Filled 2016-02-24: qty 1

## 2016-02-24 MED ORDER — DIPHENHYDRAMINE HCL 50 MG/ML IJ SOLN
25.0000 mg | Freq: Once | INTRAMUSCULAR | Status: AC
  Administered 2016-02-24: 25 mg via INTRAVENOUS

## 2016-02-24 MED ORDER — OXYCODONE-ACETAMINOPHEN 5-325 MG PO TABS: 1.0000 | ORAL_TABLET | ORAL | 0 refills | Status: DC | PRN

## 2016-02-24 MED ORDER — IOPAMIDOL (ISOVUE-300) INJECTION 61%
100.0000 mL | Freq: Once | INTRAVENOUS | Status: AC | PRN
  Administered 2016-02-24: 100 mL via INTRAVENOUS

## 2016-02-24 MED ORDER — DIPHENHYDRAMINE HCL 50 MG/ML IJ SOLN
INTRAMUSCULAR | Status: AC
  Administered 2016-02-24: 25 mg via INTRAVENOUS
  Filled 2016-02-24: qty 1

## 2016-02-24 NOTE — Discharge Instructions (Signed)
Take prednisone as prescribed. He may take 1 Percocet every 4 hours as needed for pain. Follow-up with your doctor Monday. Return to the emergency department if you have a fever, new or worsening abdominal pain, or any other symptoms concerning to you.

## 2016-02-24 NOTE — ED Provider Notes (Signed)
[facility] Emergency Department Provider Note  Time seen: 9:58 AM  I have reviewed the triage vital signs and the nursing notes.   HISTORY  Chief Complaint Abdominal Pain    HPI [name] is a 60 y.o. male with a past medical history of arthritis, eosinophilic gastroenteritis, hypertension, who presents the emergency department with abdominal pain. According to the patient since early this morning he has been experiencing lower abdominal pain, nausea and diarrhea. States the pain feels identical to exacerbations of his underlying eosinophilic gastroenteritis. States he normally resolves with a steroid taper. Patient denies any dysuria, hematuria, fever, chest pain or trouble breathing. Describes abdominal pain as moderate, cramping like pain located across entire lower abdomen.  Past Medical History:  Diagnosis Date  . Acid reflux   . Alcohol abuse   . Arthritis    "left pinky" (10/17/2014)  . DDD (degenerative disc disease), lumbar   . Gastroenteritis   . History of stress test 2001   no ischemia  . Hypertension   . Tobacco abuse   . Vertebral compression fracture (HCC)    T11, L1    Patient Active Problem List   Diagnosis Date Noted  . Bilateral recurrent inguinal hernia without obstruction or gangrene   . Influenza 10/18/2014  . Hypoxia 10/16/2014  . SOB (shortness of breath) 10/16/2014  . Cough 10/16/2014  . Fever 10/16/2014  . Sepsis ([place]) 10/16/2014  . Nausea and vomiting 10/16/2014  . Acid reflux   . DDD (degenerative disc disease), lumbar   . Hypertension   . Tobacco abuse   . Alcohol abuse   . Essential hypertension     Past Surgical History:  Procedure Laterality Date  . COLON SURGERY    . EXPLORATORY LAPAROTOMY W/ BOWEL RESECTION  1980's    Prior to Admission medications   Medication Sig Start Date End Date Taking? Authorizing Provider  aspirin 81 MG tablet Take 1 tablet (81 mg total) by mouth daily. 10/18/14    Domenic Polite, MD  methocarbamol (ROBAXIN) 500 MG tablet Take 2 tablets (1,000 mg total) by mouth 4 (four) times daily as needed for muscle spasms (muscle spasm/pain). Patient not taking: Reported on 10/15/2014 06/18/13   Francine Graven, DO  nicotine (NICODERM CQ - DOSED IN MG/24 HOURS) 21 mg/24hr patch Place 1 patch (21 mg total) onto the skin daily. 10/18/14   Domenic Polite, MD  omeprazole (PRILOSEC) 20 MG capsule Take 20 mg by mouth daily.    Historical Provider, MD  oseltamivir (TAMIFLU) 75 MG capsule Take 1 capsule (75 mg total) by mouth 2 (two) times daily. For 3days 10/18/14   Domenic Polite, MD  oxyCODONE-acetaminophen (ROXICET) 5-325 MG tablet Take 1 tablet by mouth every 6 (six) hours as needed. 09/30/15 09/29/16  Anne-Caroline Mariea Clonts, MD  predniSONE (DELTASONE) 20 MG tablet 3 Tabs PO Days 1-3, then 2 tabs PO Days 4-6, then 1 tab PO Day 7-9, then Half Tab PO Day 10-12 11/29/14   Junius Creamer, NP  tetrahydrozoline-zinc (VISINE-AC) 0.05-0.25 % ophthalmic solution Place 1 drop into both eyes 2 (two) times daily as needed (for allergies.).    Historical Provider, MD  [place] This note is to excuse Mr.Piatkowski from work 4/9 -4/17 due to medical illness requiring hospitalization 10/18/14   Domenic Polite, MD    Allergies  Allergen Reactions  . Penicillins Anaphylaxis    Has patient had a PCN reaction causing immediate rash, facial/tongue/throat swelling, SOB or lightheadedness with hypotension: Yes Has patient  had a PCN reaction causing severe rash involving mucus membranes or skin necrosis: No Has patient had a PCN reaction that required hospitalization Yes Has patient had a PCN reaction occurring within the last 10 years: No If all of the above answers are "NO", then may proceed with Cephalosporin use.    Family History  Problem Relation Age of Onset  . Diabetes Mother   . Hypertension Mother   . Stroke Father   . Heart failure Father   . Heart failure Other     Social  History Social History  Substance Use Topics  . Smoking status: Current Every Day Smoker    Packs/day: 0.50    Years: 27.00    Types: Cigarettes  . Smokeless tobacco: Never Used  . Alcohol use 38.4 oz/week    15 Cans of beer, 44 Shots of liquor, 5 Standard drinks or equivalent per week     Comment: 4/11/23016 "I drink none to 1 pint of gin/day; 40oz beer when I'm drinking; occasional wine; probably 4 pints/wk; 4, 40oz beers/wk"    Review of Systems Constitutional: Negative for fever. Cardiovascular: Negative for chest pain. Respiratory: Negative for shortness of breath. Gastrointestinal: Lower abdominal pain. Positive for nausea. Negative vomiting. Positive for diarrhea. Negative for black or bloody stool. Genitourinary: Negative for dysuria. Negative for hematuria. Musculoskeletal: Negative for back pain. Neurological: Negative for headaches, focal weakness or numbness. 10-point ROS otherwise negative.  ____________________________________________   PHYSICAL EXAM:  VITAL SIGNS: ED Triage Vitals  Enc Vitals Group     BP 02/24/16 0939 (!) 157/105     Pulse Rate 02/24/16 0939 (!) 114     Resp 02/24/16 0939 18     Temp 02/24/16 0939 98.2 F (36.8 C)     Temp Source 02/24/16 0939 Oral     SpO2 02/24/16 0939 99 %     Weight 02/24/16 0940 221 lb (100.2 kg)     Height 02/24/16 0940 5\' 11"  (1.803 m)     Head Circumference --      Peak Flow --      Pain Score 02/24/16 0940 10     Pain Loc --      Pain Edu? --      Excl. in [place]? --     Constitutional: Alert and oriented. Well appearing and in no distress. Eyes: Normal exam ENT   Head: Normocephalic and atraumatic.   Mouth/Throat: Mucous membranes are moist. Cardiovascular: Normal rate, regular rhythm. No murmur Respiratory: Normal respiratory effort without tachypnea nor retractions. Breath sounds are clear  Gastrointestinal: Soft and nontender. No distention.  Musculoskeletal: Nontender with normal range of motion  in all extremities. Neurologic:  Normal speech and language. No gross focal neurologic deficits Skin:  Skin is warm, dry and intact.  Psychiatric: Mood and affect are normal. Speech and behavior are normal.   ____________________________________________  RADIOLOGY  CT consistent with colitis.  ____________________________________________   INITIAL IMPRESSION / ASSESSMENT AND PLAN / ED COURSE  Pertinent labs & imaging results that were available during my care of the patient were reviewed by me and considered in my medical decision making (see chart for details).  The patient presents to the emergency department with lower abdominal discomfort, nausea and diarrhea which she states are typical of exacerbations of his underlying eosinophilic gastroenteritis. Patient does have moderate tenderness palpation across the lower abdomen. No rebound or guarding. We will check labs, obtain a two-view abdominal x-ray to rule out obstruction, treat pain and nausea while closely  monitoring in the emergency department.  Labs are largely within normal limits. X-ray is concerning for possible SBO. Given the patient's extensive surgical history we'll proceed with a CT scan to rule out SBO. Patient agreeable to plan.  CT consistent with colitis. Patient has a history of eosinophilic gastroenteritis. We will discharge on prednisone, Percocet and have the patient follow up with his doctor. ____________________________________________   FINAL CLINICAL IMPRESSION(S) / ED DIAGNOSES  Lower abdominal pain Colitis   Harvest Dark, MD 02/25/16 2034

## 2016-02-24 NOTE — ED Notes (Signed)
After RN gave patient morphine patient stated that his arm was itching. RN asked when this started, patient stated that after first dose of IV morphine his arm was itching but it stopped so he did not tell anyone. Patient stated that his arm started itching again when the 2nd dose of morphine was given.  Verbal order from Dr. Alfred Levins was obtained for IV Benadryl 25 mg.   Red whelps noted on patients right arm. IV in right arm was d/c and new IV placed.

## 2016-02-24 NOTE — ED Triage Notes (Signed)
Lower abdominal pain began 30 minutes ago.

## 2016-02-24 NOTE — ED Provider Notes (Signed)
-----------------------------------------   4:00 PM on 02/24/2016 -----------------------------------------   Blood pressure (!) 142/98, pulse 85, temperature 98.2 F (36.8 C), temperature source Oral, resp. rate 20, height 5\' 11"  (1.803 m), weight 221 lb (100.2 kg), SpO2 99 %.  Assuming care from Dr. Kerman Passey of [facility] Clemenson is a 60 y.o. male with a chief complaint of Abdominal Pain .    In summary, 60 year old male with past medical history of eosinophilic gastro-enteritis who presents for evaluation of one week of right-sided abdominal pain which feels similar to his prior eosinophilic gastroenteritis.  CT pending   _________________________ 4:46 PM on 02/24/2016 ----------------------------------------- CT showing: diffuse thickening of colonic wall with fatty appearance right colon transverse colon and descending colon. There is mild fatty thickening of wall of terminal ileum.  I reevaluated patient has mild tenderness on the right side of his abdomen with no rebound or guarding. He reports that his pain is 3/10. He reports that his condition usually responds to steroids. He will follow up with his primary care doctor on Monday. Will start patient on prednisone taper and discharge him home with Percocet for pain.    Rudene Re, MD 02/24/16 [phone]

## 2016-03-05 ENCOUNTER — Emergency Department
Admission: EM | Admit: 2016-03-05 | Discharge: 2016-03-05 | Disposition: A | Discharge status: Home / Self Care | Payer: Self-pay | Attending: Emergency Medicine | Admitting: Emergency Medicine

## 2016-03-05 ENCOUNTER — Encounter: Payer: Self-pay | Admitting: Emergency Medicine

## 2016-03-05 DIAGNOSIS — Z7982 Long term (current) use of aspirin: Secondary | ICD-10-CM | POA: Insufficient documentation

## 2016-03-05 DIAGNOSIS — Z79899 Other long term (current) drug therapy: Secondary | ICD-10-CM | POA: Insufficient documentation

## 2016-03-05 DIAGNOSIS — R112 Nausea with vomiting, unspecified: Secondary | ICD-10-CM | POA: Insufficient documentation

## 2016-03-05 DIAGNOSIS — R1084 Generalized abdominal pain: Secondary | ICD-10-CM | POA: Insufficient documentation

## 2016-03-05 DIAGNOSIS — I10 Essential (primary) hypertension: Secondary | ICD-10-CM | POA: Insufficient documentation

## 2016-03-05 DIAGNOSIS — F17210 Nicotine dependence, cigarettes, uncomplicated: Secondary | ICD-10-CM | POA: Insufficient documentation

## 2016-03-05 DIAGNOSIS — R109 Unspecified abdominal pain: Secondary | ICD-10-CM

## 2016-03-05 LAB — CBC
HCT: 51.3 % (ref 40.0–52.0)
HEMOGLOBIN: 17.3 g/dL (ref 13.0–18.0)
MCH: 30.8 pg (ref 26.0–34.0)
MCHC: 33.8 g/dL (ref 32.0–36.0)
MCV: 91.2 fL (ref 80.0–100.0)
PLATELETS: 206 10*3/uL (ref 150–440)
RBC: 5.63 MIL/uL (ref 4.40–5.90)
RDW: 15.3 % — AB (ref 11.5–14.5)
WBC: 13.2 10*3/uL — ABNORMAL HIGH (ref 3.8–10.6)

## 2016-03-05 LAB — URINALYSIS COMPLETE WITH MICROSCOPIC (ARMC ONLY)
BILIRUBIN URINE: NEGATIVE
Bacteria, UA: NONE SEEN
GLUCOSE, UA: NEGATIVE mg/dL
LEUKOCYTES UA: NEGATIVE
Nitrite: NEGATIVE
Protein, ur: 30 mg/dL — AB
Specific Gravity, Urine: 1.021 (ref 1.005–1.030)
pH: 5 (ref 5.0–8.0)

## 2016-03-05 LAB — TROPONIN I

## 2016-03-05 LAB — COMPREHENSIVE METABOLIC PANEL
ALBUMIN: 4.9 g/dL (ref 3.5–5.0)
ALK PHOS: 57 U/L (ref 38–126)
ALT: 27 U/L (ref 17–63)
ANION GAP: 19 — AB (ref 5–15)
AST: 33 U/L (ref 15–41)
BUN: 12 mg/dL (ref 6–20)
CALCIUM: 9.5 mg/dL (ref 8.9–10.3)
CO2: 21 mmol/L — AB (ref 22–32)
CREATININE: 1.15 mg/dL (ref 0.61–1.24)
Chloride: 95 mmol/L — ABNORMAL LOW (ref 101–111)
GFR calc Af Amer: >60 mL/min (ref >60)
GFR calc non Af Amer: >60 mL/min (ref >60)
GLUCOSE: 84 mg/dL (ref 65–99)
Potassium: 4 mmol/L (ref 3.5–5.1)
SODIUM: 135 mmol/L (ref 135–145)
Total Bilirubin: 1.6 mg/dL — ABNORMAL HIGH (ref 0.3–1.2)
Total Protein: 7.8 g/dL (ref 6.5–8.1)

## 2016-03-05 LAB — LIPASE, BLOOD: Lipase: 21 U/L (ref 11–51)

## 2016-03-05 MED ORDER — ONDANSETRON HCL 4 MG PO TABS: 4.0000 mg | ORAL_TABLET | Freq: Three times a day (TID) | ORAL | 0 refills | Status: DC | PRN

## 2016-03-05 MED ORDER — SODIUM CHLORIDE 0.9 % IV BOLUS (SEPSIS)
500.0000 mL | Freq: Once | INTRAVENOUS | Status: AC
  Administered 2016-03-05: 500 mL via INTRAVENOUS

## 2016-03-05 MED ORDER — PREDNISONE 10 MG PO TABS: ORAL_TABLET | ORAL | 0 refills | Status: DC

## 2016-03-05 MED ORDER — METHYLPREDNISOLONE SODIUM SUCC 125 MG IJ SOLR
125.0000 mg | Freq: Once | INTRAMUSCULAR | Status: AC
  Administered 2016-03-05: 125 mg via INTRAVENOUS
  Filled 2016-03-05: qty 2

## 2016-03-05 MED ORDER — PREDNISONE 10 MG (21) PO TBPK: 10.0000 mg | ORAL_TABLET | Freq: Every day | ORAL | 0 refills | Status: DC

## 2016-03-05 MED ORDER — SODIUM CHLORIDE 0.9 % IV BOLUS (SEPSIS)
1000.0000 mL | Freq: Once | INTRAVENOUS | Status: AC
  Administered 2016-03-05: 1000 mL via INTRAVENOUS

## 2016-03-05 MED ORDER — HYDROMORPHONE HCL 1 MG/ML IJ SOLN
1.0000 mg | Freq: Once | INTRAMUSCULAR | Status: AC
  Administered 2016-03-05: 1 mg via INTRAVENOUS
  Filled 2016-03-05: qty 1

## 2016-03-05 NOTE — ED Notes (Signed)
Pt presents to ED with diffused  abdominal pain for the last couple of hours, pt states was seen here last week for similar symptoms but reports pain is more severe tonight, Pt states was given prescription for pain meds Percocet and prednisone.pt states has rare abd disorder.  Pt appears in discomfort, tachypnea,sweating, tachycardia, hypertensive.  Pt reports dark green vomit x 1 large amount. Abd distention noted.

## 2016-03-05 NOTE — ED Provider Notes (Signed)
[facility] Emergency Department Provider Note   ____________________________________________   First MD Initiated Contact with Patient 03/05/16 0501     (approximate)  I have reviewed the triage vital signs and the nursing notes.   HISTORY  Chief Complaint Abdominal Pain    HPI [name] is a 60 y.o. male who comes into the hospital today with abdominal pain. He reports it has been going on for a couple of hours. The patient has a history of eosinophilic gastroenteritis and reports that his pain usually will get better with steroids. He reports he has flares and he was here last week and given Percocet and prednisone. He's had some decreased appetite today and woke up with some green emesis. He reports that this pain is worse and it has been in the recent years. The patient had surgery done about 30 years ago and he feels that this is similar. The patient denies any chest pain, shortness of breath. He reports his pain is all over and is a 10 out of 10 in intensity. The pain is cramping in nature.   Past Medical History:  Diagnosis Date  . Acid reflux   . Alcohol abuse   . Arthritis    "left pinky" (10/17/2014)  . DDD (degenerative disc disease), lumbar   . Gastroenteritis   . History of stress test 2001   no ischemia  . Hypertension   . Tobacco abuse   . Vertebral compression fracture (HCC)    T11, L1    Patient Active Problem List   Diagnosis Date Noted  . Bilateral recurrent inguinal hernia without obstruction or gangrene   . Influenza 10/18/2014  . Hypoxia 10/16/2014  . SOB (shortness of breath) 10/16/2014  . Cough 10/16/2014  . Fever 10/16/2014  . Sepsis ([place]) 10/16/2014  . Nausea and vomiting 10/16/2014  . Acid reflux   . DDD (degenerative disc disease), lumbar   . Hypertension   . Tobacco abuse   . Alcohol abuse   . Essential hypertension     Past Surgical History:  Procedure Laterality Date  . COLON SURGERY      . EXPLORATORY LAPAROTOMY W/ BOWEL RESECTION  1980's    Prior to Admission medications   Medication Sig Start Date End Date Taking? Authorizing Provider  aspirin 81 MG tablet Take 1 tablet (81 mg total) by mouth daily. 10/18/14   Domenic Polite, MD  methocarbamol (ROBAXIN) 500 MG tablet Take 2 tablets (1,000 mg total) by mouth 4 (four) times daily as needed for muscle spasms (muscle spasm/pain). Patient not taking: Reported on 10/15/2014 06/18/13   Francine Graven, DO  nicotine (NICODERM CQ - DOSED IN MG/24 HOURS) 21 mg/24hr patch Place 1 patch (21 mg total) onto the skin daily. 10/18/14   Domenic Polite, MD  omeprazole (PRILOSEC) 20 MG capsule Take 20 mg by mouth daily.    Historical Provider, MD  oseltamivir (TAMIFLU) 75 MG capsule Take 1 capsule (75 mg total) by mouth 2 (two) times daily. For 3days 10/18/14   Domenic Polite, MD  oxyCODONE-acetaminophen (ROXICET) 5-325 MG tablet Take 1 tablet by mouth every 4 (four) hours as needed for severe pain. 02/24/16   Rudene Re, MD  predniSONE (STERAPRED UNI-PAK 21 TAB) 10 MG (21) TBPK tablet Take 1 tablet (10 mg total) by mouth daily. Take 40 mg daily for 2 days, 30mg  daily for 2 days, 20 mg daily for 2 days, 10mg  daily for 2 days and the stop 02/24/16   Rudene Re,  MD  tetrahydrozoline-zinc (VISINE-AC) 0.05-0.25 % ophthalmic solution Place 1 drop into both eyes 2 (two) times daily as needed (for allergies.).    Historical Provider, MD  [place] This note is to excuse Mr.Figler from work 4/9 -4/17 due to medical illness requiring hospitalization 10/18/14   Domenic Polite, MD    Allergies Penicillins and Morphine and related  Family History  Problem Relation Age of Onset  . Diabetes Mother   . Hypertension Mother   . Stroke Father   . Heart failure Father   . Heart failure Other     Social History Social History  Substance Use Topics  . Smoking status: Current Every Day Smoker    Packs/day: 0.50    Years: 27.00    Types:  Cigarettes  . Smokeless tobacco: Never Used  . Alcohol use 38.4 oz/week    15 Cans of beer, 44 Shots of liquor, 5 Standard drinks or equivalent per week     Comment: 4/11/23016 "I drink none to 1 pint of gin/day; 40oz beer when I'm drinking; occasional wine; probably 4 pints/wk; 4, 40oz beers/wk"    Review of Systems Constitutional: No fever/chills Eyes: No visual changes. ENT: No sore throat. Cardiovascular: Denies chest pain. Respiratory: Denies shortness of breath. Gastrointestinal:  abdominal pain.   nausea,  vomiting.  No diarrhea.  No constipation. Genitourinary: Negative for dysuria. Musculoskeletal: Negative for back pain. Skin: Negative for rash. Neurological: Negative for headaches, focal weakness or numbness.  10-point ROS otherwise negative.  ____________________________________________   PHYSICAL EXAM:  VITAL SIGNS: ED Triage Vitals  Enc Vitals Group     BP 03/05/16 0424 (!) 163/83     Pulse Rate 03/05/16 0424 (!) 122     Resp 03/05/16 0424 (!) 22     Temp 03/05/16 0424 97.8 F (36.6 C)     Temp Source 03/05/16 0424 Oral     SpO2 03/05/16 0424 98 %     Weight 03/05/16 0425 221 lb (100.2 kg)     Height 03/05/16 0425 5\' 11"  (1.803 m)     Head Circumference --      Peak Flow --      Pain Score 03/05/16 0425 10     Pain Loc --      Pain Edu? --      Excl. in [place]? --     Constitutional: Alert and oriented. Well appearing and in no acute distress. Eyes: Conjunctivae are normal. PERRL. EOMI. Head: Atraumatic. Nose: No congestion/rhinnorhea. Mouth/Throat: Mucous membranes are moist.  Oropharynx non-erythematous. Cardiovascular: Normal rate, regular rhythm. Grossly normal heart sounds.  Good peripheral circulation. Respiratory: Normal respiratory effort.  No retractions. Lungs CTAB. Gastrointestinal: Soft With diffuse abdominal tenderness to palpation. Mild to moderate distention. Positive bowel sounds Musculoskeletal: No lower extremity tenderness nor edema.    Neurologic:  Normal speech and language. Skin:  Skin is warm, dry and intact. Marland Kitchen Psychiatric: Mood and affect are normal.   ____________________________________________   LABS (all labs ordered are listed, but only abnormal results are displayed)  Labs Reviewed  COMPREHENSIVE METABOLIC PANEL - Abnormal; Notable for the following:       Result Value   Chloride 95 (*)    CO2 21 (*)    Total Bilirubin 1.6 (*)    Anion gap 19 (*)    All other components within normal limits  CBC - Abnormal; Notable for the following:    WBC 13.2 (*)    RDW 15.3 (*)    All  other components within normal limits  LIPASE, BLOOD  TROPONIN I  URINALYSIS COMPLETEWITH MICROSCOPIC ([facility] ONLY)   ____________________________________________  EKG  ED ECG REPORT I, Loney Hering, the attending physician, personally viewed and interpreted this ECG.   Date: 03/05/2016  EKG Time: 449  Rate: 109  Rhythm: sinus tachycardia  Axis: right axis  Intervals:right bundle branch block and left anterior fascicular block  ST&T Change: normal  ____________________________________________  RADIOLOGY  none ____________________________________________   PROCEDURES  Procedure(s) performed: None  Procedures  Critical Care performed: No  ____________________________________________   INITIAL IMPRESSION / ASSESSMENT AND PLAN / ED COURSE  Pertinent labs & imaging results that were available during my care of the patient were reviewed by me and considered in my medical decision making (see chart for details).  This is a 60 year old male who comes to the hospital today with abdominal pain. The patient does have eosinophilic gastroenteritis which causes these flares but he reports that he's been having bilious emesis and this is worse in its been in the past. I will review the patient's previous CT scan and determine if the patient needs any imaging. I will give him a dose of Dilaudid as well as some  Zofran and normal saline. The patient will also receive a dose of Solu-Medrol. He'll be reassessed.  Clinical Course    The patient has had many previous CT scans that show some distal bowel thickening with no acute disease. Given the amount of patient's CT scans I will hold off on a CT scan at this time. After the medication his pain is improved in his tachycardia is also improved. I will give the patient another dose of 500 ML's of normal saline and we will try a by mouth trial. The patient's care will be signed out to Dr. Burlene Arnt follow-up and reassess the patient. ____________________________________________   FINAL CLINICAL IMPRESSION(S) / ED DIAGNOSES  Final diagnoses:  Generalized abdominal pain      NEW MEDICATIONS STARTED DURING THIS VISIT:  New Prescriptions   No medications on file     Note:  This document was prepared using Dragon voice recognition software and may include unintentional dictation errors.    Loney Hering, MD 03/05/16 [phone]

## 2016-03-05 NOTE — ED Triage Notes (Signed)
Pt presents to ED with c/o lower abdominal pain since last week, pt reports was seen here last week for similar symptoms but reports pain is more severe, states was given prescription for pain meds and prednisone. Pt appears in discomfort, pt brought in specimen of emesis, color is dark green.

## 2016-03-05 NOTE — Clinical Social Work Note (Signed)
Clinical Social Work Assessment  Patient Details  Name: [name] MRN: [id number] Date of Birth: [birth year]  Date of referral:  03/05/16               Reason for consult:  Care Management Concerns, Financial Concerns                Permission sought to share information with:    Permission granted to share information::     Name::        Agency::     Relationship::     Contact Information:     Housing/Transportation Living arrangements for the past 2 months:  Single Family Home Source of Information:  Patient, Spouse Patient Interpreter Needed:  None Criminal Activity/Legal Involvement Pertinent to Current Situation/Hospitalization:  No - Comment as needed Significant Relationships:  Spouse Lives with:  Spouse Do you feel safe going back to the place where you live?  Yes Need for family participation in patient care:  No (Coment)  Care giving concerns: No care giving concerns identified at this time.   Social Worker assessment / plan: CSW received consult for financial concerns. CSW engaged with pt at his bedside, with pt's wife present. CSW introduced herself and her role as a Education officer, museum. Pt reports that he has limited financial resources and also does not have insurance. Pt voiced concerns of not being able to pay for his PCP and his medications. CSW expressed her understanding and provided emotional support. CSW provided pt with an application to the Medication management clinic and explained the referral process and criteria. CSW also provided pt with resources for applying for Medicaid, and affordable PCPs in the area. CSW offered pt resources for free mental health services in his area, however, pt declined and states that he does not need mental health services. Pt states that he plans to fill out medication management clinic application and fax it to the office tomorrow. No further social work needs at this time. CSW signing off.  Employment status:     Insurance information:  Other (Comment Required) (No insurance) PT Recommendations:  Not assessed at this time Information / Referral to community resources:  Other (Comment Required) (Medication Management Clinic)  Patient/Family's Response to care: Pt will fax application to the medication management clinic tomorrow.  Patient/Family's Understanding of and Emotional Response to Diagnosis, Current Treatment, and Prognosis: Pt is appreciative of the assistance provided by CSW at this time.  Emotional Assessment Appearance:  Appears stated age Attitude/Demeanor/Rapport:  Other (Cooperative) Affect (typically observed):  Accepting, Appropriate, Calm, Pleasant Orientation:  Oriented to Self, Oriented to Place, Oriented to  Time, Oriented to Situation Alcohol / Substance use:  Not Applicable Psych involvement (Current and /or in the community):  No (Comment)  Discharge Needs  Concerns to be addressed:  Discharge Planning Concerns, Medication Concerns Readmission within the last 30 days:  Yes Current discharge risk:  Inadequate Financial Supports Barriers to Discharge:  No Barriers Identified   Georga Kaufmann, LCSWA 03/05/2016, 11:45 AM

## 2016-03-05 NOTE — ED Notes (Signed)
Pt appears more comfortable after dilaudid IVP. MD at the bedside

## 2016-03-05 NOTE — ED Provider Notes (Addendum)
-----------------------------------------   11:04 AM on 03/05/2016 -----------------------------------------  Patient has chronic recurrent abdominal pain. He has had will CT scan for the last, at least 6 here, none of which ever show anything but some bowel wall thickening. He has a normal flare of his eosinophilic disorder. He is requesting a steroid taper which she states always gets him better. He denies any fever or chills at this time. He is tolerating by mouth. We discussed CT scan and he refuses at this time. Workup is otherwise unremarkable. He understands he can come back anytime should he have increased pain fever vomiting etc. he also understands the absolute need for outpatient GI follow-up.   [name], MD 03/05/16 1104    [name], MD 03/05/16 [phone]

## 2016-09-04 ENCOUNTER — Emergency Department
Admission: EM | Admit: 2016-09-04 | Discharge: 2016-09-04 | Disposition: A | Discharge status: Home / Self Care | Payer: Self-pay | Attending: Emergency Medicine | Admitting: Emergency Medicine

## 2016-09-04 ENCOUNTER — Encounter: Payer: Self-pay | Admitting: Emergency Medicine

## 2016-09-04 DIAGNOSIS — F17210 Nicotine dependence, cigarettes, uncomplicated: Secondary | ICD-10-CM | POA: Insufficient documentation

## 2016-09-04 DIAGNOSIS — Z7982 Long term (current) use of aspirin: Secondary | ICD-10-CM | POA: Insufficient documentation

## 2016-09-04 DIAGNOSIS — I10 Essential (primary) hypertension: Secondary | ICD-10-CM | POA: Insufficient documentation

## 2016-09-04 DIAGNOSIS — M62830 Muscle spasm of back: Secondary | ICD-10-CM | POA: Insufficient documentation

## 2016-09-04 MED ORDER — KETOROLAC TROMETHAMINE 60 MG/2ML IM SOLN
30.0000 mg | Freq: Once | INTRAMUSCULAR | Status: AC
  Administered 2016-09-04: 30 mg via INTRAMUSCULAR
  Filled 2016-09-04: qty 2

## 2016-09-04 MED ORDER — ORPHENADRINE CITRATE 30 MG/ML IJ SOLN
60.0000 mg | Freq: Two times a day (BID) | INTRAMUSCULAR | Status: DC
  Administered 2016-09-04: 60 mg via INTRAMUSCULAR
  Filled 2016-09-04: qty 2

## 2016-09-04 MED ORDER — CYCLOBENZAPRINE HCL 5 MG PO TABS: 5.0000 mg | ORAL_TABLET | Freq: Three times a day (TID) | ORAL | 0 refills | Status: DC | PRN

## 2016-09-04 MED ORDER — HYDROCODONE-ACETAMINOPHEN 5-325 MG PO TABS: 1.0000 | ORAL_TABLET | Freq: Four times a day (QID) | ORAL | 0 refills | Status: DC | PRN

## 2016-09-04 MED ORDER — PREDNISONE 10 MG PO TABS: 10.0000 mg | ORAL_TABLET | Freq: Every day | ORAL | 0 refills | Status: DC

## 2016-09-04 NOTE — ED Triage Notes (Signed)
Pt comes into the ED via POV c/o lower back pain that started Tuesday.  States he worked Monday and then woke up Tuesday with the pain.  Denies any known injury to the back.  Patient able to ambulate to the room with no difficulty.

## 2016-09-04 NOTE — ED Provider Notes (Signed)
[name] Provider Note   CSN: [id number] Arrival date & time: 09/04/16  1832     History   Chief Complaint Chief Complaint  Patient presents with  . Back Pain    HPI [name] is a 61 y.o. male presents to the emergency department for evaluation of lower back spasms. Patient had one day of severe back spasms on the left and right side of his lower back. He describes severe tightness. No numbness tingling or radicular symptoms. No trauma or injury. Patient states 2 days ago he was doing a lot of heavy lifting at work. He states his pain is 9 and 10. Is not a medications for pain. He denies any abdominal pain, chest pain, shortness of breath or vision changes. He states he does not have a history of hypertension, states his blood pressure is elevated due to pain. Currently does not have a primary care provider.  HPI  Past Medical History:  Diagnosis Date  . Acid reflux   . Alcohol abuse   . Arthritis    "left pinky" (10/17/2014)  . DDD (degenerative disc disease), lumbar   . Gastroenteritis   . History of stress test 2001   no ischemia  . Hypertension   . Tobacco abuse   . Vertebral compression fracture (HCC)    T11, L1    Patient Active Problem List   Diagnosis Date Noted  . Bilateral recurrent inguinal hernia without obstruction or gangrene   . Influenza 10/18/2014  . Hypoxia 10/16/2014  . SOB (shortness of breath) 10/16/2014  . Cough 10/16/2014  . Fever 10/16/2014  . Sepsis ([place]) 10/16/2014  . Nausea and vomiting 10/16/2014  . Acid reflux   . DDD (degenerative disc disease), lumbar   . Hypertension   . Tobacco abuse   . Alcohol abuse   . Essential hypertension     Past Surgical History:  Procedure Laterality Date  . COLON SURGERY    . EXPLORATORY LAPAROTOMY W/ BOWEL RESECTION  1980's       Home Medications    Prior to Admission medications   Medication Sig Start Date End Date Taking? Authorizing Provider  aspirin 81  MG tablet Take 1 tablet (81 mg total) by mouth daily. Patient not taking: Reported on 03/05/2016 10/18/14   Domenic Polite, MD  cyclobenzaprine (FLEXERIL) 5 MG tablet Take 1-2 tablets (5-10 mg total) by mouth 3 (three) times daily as needed for muscle spasms. 09/04/16   [name], [name]  HYDROcodone-acetaminophen (NORCO) 5-325 MG tablet Take 1 tablet by mouth every 6 (six) hours as needed for moderate pain. 09/04/16   [name], [name]  nicotine (NICODERM CQ - DOSED IN MG/24 HOURS) 21 mg/24hr patch Place 1 patch (21 mg total) onto the skin daily. Patient not taking: Reported on 03/05/2016 10/18/14   Domenic Polite, MD  omeprazole (PRILOSEC) 20 MG capsule Take 20 mg by mouth daily.    Historical Provider, MD  ondansetron (ZOFRAN) 4 MG tablet Take 1 tablet (4 mg total) by mouth every 8 (eight) hours as needed for nausea or vomiting. 03/05/16   [name], MD  oseltamivir (TAMIFLU) 75 MG capsule Take 1 capsule (75 mg total) by mouth 2 (two) times daily. For 3days Patient not taking: Reported on 03/05/2016 10/18/14   Domenic Polite, MD  oxyCODONE-acetaminophen (ROXICET) 5-325 MG tablet Take 1 tablet by mouth every 4 (four) hours as needed for severe pain. Patient not taking: Reported on 03/05/2016 02/24/16   Rudene Re, MD  predniSONE (DELTASONE) 10 MG tablet Take 1 tablet (10 mg total) by mouth daily. 6,5,4,3,2,1 six day taper 09/04/16   [name], [name]  tetrahydrozoline-zinc (VISINE-AC) 0.05-0.25 % ophthalmic solution Place 1 drop into both eyes 2 (two) times daily as needed (for allergies.).    Historical Provider, MD  [place] This note is to excuse [name] from work 4/9 -4/17 due to medical illness requiring hospitalization Patient not taking: Reported on 03/05/2016 10/18/14   Domenic Polite, MD    Family History Family History  Problem Relation Age of Onset  . Diabetes Mother   . Hypertension Mother   . Stroke Father   . Heart failure Father   . Heart failure Other      Social History Social History  Substance Use Topics  . Smoking status: Current Every Day Smoker    Packs/day: 0.50    Years: 27.00    Types: Cigarettes  . Smokeless tobacco: Never Used  . Alcohol use 38.4 oz/week    15 Cans of beer, 44 Shots of liquor, 5 Standard drinks or equivalent per week     Comment: 4/11/23016 "I drink none to 1 pint of gin/day; 40oz beer when I'm drinking; occasional wine; probably 4 pints/wk; 4, 40oz beers/wk"     Allergies   Penicillins and Morphine and related   Review of Systems Review of Systems  Constitutional: Negative.  Negative for activity change, appetite change, chills and fever.  HENT: Negative for congestion, ear pain, mouth sores, rhinorrhea, sinus pressure, sore throat and trouble swallowing.   Eyes: Negative for photophobia, pain and discharge.  Respiratory: Negative for cough, chest tightness and shortness of breath.   Cardiovascular: Negative for chest pain and leg swelling.  Gastrointestinal: Negative for abdominal distention, abdominal pain, diarrhea, nausea and vomiting.  Genitourinary: Negative for difficulty urinating and dysuria.  Musculoskeletal: Positive for back pain and myalgias. Negative for arthralgias and gait problem.  Skin: Negative for color change and rash.  Neurological: Negative for dizziness and headaches.  Hematological: Negative for adenopathy.  Psychiatric/Behavioral: Negative for agitation and behavioral problems.     Physical Exam Updated Vital Signs BP (!) 153/100 (BP Location: Right Arm)   Pulse 96   Temp 98.3 F (36.8 C) (Oral)   Resp 18   Ht 5\' 11"  (1.803 m)   Wt 101.6 kg   SpO2 100%   BMI 31.24 kg/m   Physical Exam  Constitutional: He is oriented to person, place, and time. He appears well-developed and well-nourished.  HENT:  Head: Normocephalic and atraumatic.  Eyes: Conjunctivae and EOM are normal. Pupils are equal, round, and reactive to light.  Neck: Normal range of motion. Neck  supple.  Cardiovascular: Normal rate, regular rhythm, normal heart sounds and intact distal pulses.   Pulmonary/Chest: Effort normal and breath sounds normal. No respiratory distress. He has no wheezes. He has no rales. He exhibits no tenderness.  Abdominal: Soft. Bowel sounds are normal. He exhibits no distension and no mass. There is no tenderness. There is no rebound and no guarding. No hernia.  Musculoskeletal: Normal range of motion. He exhibits no edema or tenderness.  Lumbar Spine: Examination of the lumbar spine reveals no bony abnormality, no edema, and no ecchymosis.  There is no step off.  The patient has decreased range of motion of the lumbar spine with flexion and extension.  The patient has normal lateral bend and rotation.  The patient has no pain with range of motion activities.  The patient has  a negative axial load test, and a negative rotational Waddell test.  The patient is non tender along the spinous process.  The patient is non tender along the paravertebral muscles, with no muscle spasms.  The patient is non tender along the iliac crest.  The patient is non tender in the sciatic notch.  The patient is non tender along the Sacroiliac joint.  There is no Coccyx joint tenderness.    Bilateral Lower Extremities: Examination of the lower extremities reveals no bony abnormality, no edema, and no ecchymosis.  The patient has full active and passive range of motion of the hips, knees, and ankles.  There is no discomfort with range of motion exercises.  The patient is non tender along the greater trochanter region.  The patient has a negative Bevelyn Buckles' test bilaterally.  There is normal skin warmth.  There is normal capillary refill bilaterally.    Neurologic: The patient has a negative straight leg raise.  The patient has normal muscle strength testing for the quadriceps, calves, ankle dorsiflexion, ankle plantarflexion, and extensor hallicus longus.  The patient has sensation that is  intact to light touch.     Neurological: He is alert and oriented to person, place, and time.  Skin: Skin is warm and dry.  Psychiatric: He has a normal mood and affect. His behavior is normal. Judgment and thought content normal.     ED Treatments / Results  Labs (all labs ordered are listed, but only abnormal results are displayed) Labs Reviewed - No data to display  EKG  EKG Interpretation None       Radiology No results found.  Procedures Procedures (including critical care time)  Medications Ordered in ED Medications  orphenadrine (NORFLEX) injection 60 mg (60 mg Intramuscular Given 09/04/16 2000)  ketorolac (TORADOL) injection 30 mg (30 mg Intramuscular Given 09/04/16 2000)     Initial Impression / Assessment and Plan / ED Course  I have reviewed the triage vital signs and the nursing notes.  Pertinent labs & imaging results that were available during my care of the patient were reviewed by me and considered in my medical decision making (see chart for details).     61 year old male with lower back spasms. No radicular symptoms. No neurological deficits. He is given Toradol 30 mg, Norflex 60 mg IM and saw greater than 50% reduction in his muscle spasms. He will follow-up with orthopedics, he is given anti-inflammatory medication, muscle relaxer and pain medication. He will monitor his blood pressure and establish care with PCP. He is educated on signs and symptoms return to the ED for.  Final Clinical Impressions(s) / ED Diagnoses   Final diagnoses:  Hypertension, unspecified type  Muscle spasm of back    New Prescriptions New Prescriptions   CYCLOBENZAPRINE (FLEXERIL) 5 MG TABLET    Take 1-2 tablets (5-10 mg total) by mouth 3 (three) times daily as needed for muscle spasms.   HYDROCODONE-ACETAMINOPHEN (NORCO) 5-325 MG TABLET    Take 1 tablet by mouth every 6 (six) hours as needed for moderate pain.   PREDNISONE (DELTASONE) 10 MG TABLET    Take 1 tablet (10 mg  total) by mouth daily. 6,5,4,3,2,1 six day taper     [name], [name] 09/04/16 2126    Lisa Roca, MD 09/06/16 [phone]

## 2016-09-04 NOTE — Discharge Instructions (Signed)
Please follow-up with current [facility] and establish primary care provider. Return to the ER immediately for any headaches, vision changes, chest pain, shortness of breath. Please follow-up with walk-in clinic if any continued muscle spasms worsening symptoms or urgent changes in her health. Please take medications as prescribed. Avoid heavy lifting pushing and pulling.

## 2016-09-26 ENCOUNTER — Encounter: Payer: Self-pay | Admitting: Emergency Medicine

## 2016-09-26 ENCOUNTER — Emergency Department
Admission: EM | Admit: 2016-09-26 | Discharge: 2016-09-26 | Disposition: A | Discharge status: Home / Self Care | Payer: Self-pay | Attending: Emergency Medicine | Admitting: Emergency Medicine

## 2016-09-26 DIAGNOSIS — I10 Essential (primary) hypertension: Secondary | ICD-10-CM | POA: Insufficient documentation

## 2016-09-26 DIAGNOSIS — F17210 Nicotine dependence, cigarettes, uncomplicated: Secondary | ICD-10-CM | POA: Insufficient documentation

## 2016-09-26 DIAGNOSIS — Z7982 Long term (current) use of aspirin: Secondary | ICD-10-CM | POA: Insufficient documentation

## 2016-09-26 DIAGNOSIS — Z79899 Other long term (current) drug therapy: Secondary | ICD-10-CM | POA: Insufficient documentation

## 2016-09-26 DIAGNOSIS — K4090 Unilateral inguinal hernia, without obstruction or gangrene, not specified as recurrent: Secondary | ICD-10-CM

## 2016-09-26 NOTE — ED Triage Notes (Signed)
Pt ambulatory to triage with steady gait, no distress noted. 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

## 2016-09-26 NOTE — ED Notes (Signed)
ED Provider at bedside. 

## 2016-09-26 NOTE — ED Provider Notes (Signed)
[facility] Emergency Department Provider Note    First MD Initiated Contact with Patient 09/26/16 0230     (approximate)  I have reviewed the triage vital signs and the nursing notes.   HISTORY  Chief Complaint Hernia   HPI [name] is a 61 y.o. male with below list of chronic medical conditions including a right inguinal hernia presents with pain at the site of his right inguinal hernia and bulging. Patient states he is usually able to "push it back in however he has been unable to do so tonight. Patient said symptoms started after lifting a box at work. Patient denies any nausea or vomiting no constipation or diarrhea. Patient states current pain score is 4 out of 10.   Past Medical History:  Diagnosis Date  . Acid reflux   . Alcohol abuse   . Arthritis    "left pinky" (10/17/2014)  . DDD (degenerative disc disease), lumbar   . Gastroenteritis   . History of stress test 2001   no ischemia  . Hypertension   . Tobacco abuse   . Vertebral compression fracture (HCC)    T11, L1    Patient Active Problem List   Diagnosis Date Noted  . Bilateral recurrent inguinal hernia without obstruction or gangrene   . Influenza 10/18/2014  . Hypoxia 10/16/2014  . SOB (shortness of breath) 10/16/2014  . Cough 10/16/2014  . Fever 10/16/2014  . Sepsis ([place]) 10/16/2014  . Nausea and vomiting 10/16/2014  . Acid reflux   . DDD (degenerative disc disease), lumbar   . Hypertension   . Tobacco abuse   . Alcohol abuse   . Essential hypertension     Past Surgical History:  Procedure Laterality Date  . COLON SURGERY    . EXPLORATORY LAPAROTOMY W/ BOWEL RESECTION  1980's    Prior to Admission medications   Medication Sig Start Date End Date Taking? Authorizing Provider  aspirin 81 MG tablet Take 1 tablet (81 mg total) by mouth daily. Patient not taking: Reported on 03/05/2016 10/18/14   Domenic Polite, MD  cyclobenzaprine (FLEXERIL) 5 MG tablet  Take 1-2 tablets (5-10 mg total) by mouth 3 (three) times daily as needed for muscle spasms. 09/04/16   [name], [name]  HYDROcodone-acetaminophen (NORCO) 5-325 MG tablet Take 1 tablet by mouth every 6 (six) hours as needed for moderate pain. 09/04/16   [name], [name]  nicotine (NICODERM CQ - DOSED IN MG/24 HOURS) 21 mg/24hr patch Place 1 patch (21 mg total) onto the skin daily. Patient not taking: Reported on 03/05/2016 10/18/14   Domenic Polite, MD  omeprazole (PRILOSEC) 20 MG capsule Take 20 mg by mouth daily.    Historical Provider, MD  ondansetron (ZOFRAN) 4 MG tablet Take 1 tablet (4 mg total) by mouth every 8 (eight) hours as needed for nausea or vomiting. 03/05/16   [name], MD  oseltamivir (TAMIFLU) 75 MG capsule Take 1 capsule (75 mg total) by mouth 2 (two) times daily. For 3days Patient not taking: Reported on 03/05/2016 10/18/14   Domenic Polite, MD  oxyCODONE-acetaminophen (ROXICET) 5-325 MG tablet Take 1 tablet by mouth every 4 (four) hours as needed for severe pain. Patient not taking: Reported on 03/05/2016 02/24/16   Rudene Re, MD  predniSONE (DELTASONE) 10 MG tablet Take 1 tablet (10 mg total) by mouth daily. 6,5,4,3,2,1 six day taper 09/04/16   [name], [name]  tetrahydrozoline-zinc (VISINE-AC) 0.05-0.25 % ophthalmic solution Place 1 drop into  both eyes 2 (two) times daily as needed (for allergies.).    Historical Provider, MD  [place] This note is to excuse [name] from work 4/9 -4/17 due to medical illness requiring hospitalization Patient not taking: Reported on 03/05/2016 10/18/14   Domenic Polite, MD    Allergies Penicillins and Morphine and related  Family History  Problem Relation Age of Onset  . Diabetes Mother   . Hypertension Mother   . Stroke Father   . Heart failure Father   . Heart failure Other     Social History Social History  Substance Use Topics  . Smoking status: Current Every Day Smoker    Packs/day: 0.50     Years: 27.00    Types: Cigarettes  . Smokeless tobacco: Never Used  . Alcohol use 38.4 oz/week    15 Cans of beer, 44 Shots of liquor, 5 Standard drinks or equivalent per week     Comment: 4/11/23016 "I drink none to 1 pint of gin/day; 40oz beer when I'm drinking; occasional wine; probably 4 pints/wk; 4, 40oz beers/wk"    Review of Systems Constitutional: No fever/chills Eyes: No visual changes. ENT: No sore throat. Cardiovascular: Denies chest pain. Respiratory: Denies shortness of breath. Gastrointestinal: Positive for abdominal pain.  No nausea, no vomiting.  No diarrhea.  No constipation. Genitourinary: Negative for dysuria. Musculoskeletal: Negative for back pain. Skin: Negative for rash. Neurological: Negative for headaches, focal weakness or numbness.  10-point ROS otherwise negative.  ____________________________________________   PHYSICAL EXAM:  VITAL SIGNS: ED Triage Vitals  Enc Vitals Group     BP 09/26/16 0231 132/88     Pulse Rate 09/26/16 0231 95     Resp 09/26/16 0231 16     Temp 09/26/16 0231 98.2 F (36.8 C)     Temp Source 09/26/16 0231 Oral     SpO2 09/26/16 0228 99 %     Weight 09/26/16 0231 217 lb (98.4 kg)     Height 09/26/16 0231 5\' 11"  (1.803 m)     Head Circumference --      Peak Flow --      Pain Score --      Pain Loc --      Pain Edu? --      Excl. in [place]? --     Constitutional: Alert and oriented. Well appearing and in no acute distress. Eyes: Conjunctivae are normal. PERRL. EOMI. Head: Atraumatic. Mouth/Throat: Mucous membranes are moist. Neck: No stridor.   Cardiovascular: Normal rate, regular rhythm. Good peripheral circulation. Grossly normal heart sounds. Respiratory: Normal respiratory effort.  No retractions. Lungs CTAB. Gastrointestinal: Right inguinal tenderness to palpation with bulging noted. No distention.  Musculoskeletal: No lower extremity tenderness nor edema. No gross deformities of extremities. Neurologic:  Normal  speech and language. No gross focal neurologic deficits are appreciated.  Skin:  Skin is warm, dry and intact. No rash noted. Psychiatric: Mood and affect are normal. Speech and behavior are normal.     Procedures   ____________________________________________   INITIAL IMPRESSION / ASSESSMENT AND PLAN / ED COURSE  Pertinent labs & imaging results that were available during my care of the patient were reviewed by me and considered in my medical decision making (see chart for details).  61 year old male with known right inguinal hernia presents with pain at the site of the hernia with bulging. Hernia was manually reduced with complete resolution of the patient's discomfort. Patient was observed in the emergency part for an additional hour on reexamination  patient had no further abdominal pain no pain or bulging at the site of the hernia. Patient will be referred again to Dr. Burt Knack general surgeon for outpatient evaluation and management for right inguinal hernia.      ____________________________________________  FINAL CLINICAL IMPRESSION(S) / ED DIAGNOSES  Final diagnoses:  Right inguinal hernia     MEDICATIONS GIVEN DURING THIS VISIT:  Medications - No data to display   NEW OUTPATIENT MEDICATIONS STARTED DURING THIS VISIT:  New Prescriptions   No medications on file    Modified Medications   No medications on file    Discontinued Medications   No medications on file     Note:  This document was prepared using Dragon voice recognition software and may include unintentional dictation errors.    Gregor Hams, MD 09/26/16 2232

## 2016-11-09 ENCOUNTER — Encounter: Payer: Self-pay | Admitting: Emergency Medicine

## 2016-11-09 ENCOUNTER — Emergency Department
Admission: EM | Admit: 2016-11-09 | Discharge: 2016-11-09 | Disposition: A | Discharge status: Home / Self Care | Payer: Self-pay | Attending: Emergency Medicine | Admitting: Emergency Medicine

## 2016-11-09 DIAGNOSIS — Y929 Unspecified place or not applicable: Secondary | ICD-10-CM | POA: Insufficient documentation

## 2016-11-09 DIAGNOSIS — S61211A Laceration without foreign body of left index finger without damage to nail, initial encounter: Secondary | ICD-10-CM | POA: Insufficient documentation

## 2016-11-09 DIAGNOSIS — Y999 Unspecified external cause status: Secondary | ICD-10-CM | POA: Insufficient documentation

## 2016-11-09 DIAGNOSIS — I10 Essential (primary) hypertension: Secondary | ICD-10-CM | POA: Insufficient documentation

## 2016-11-09 DIAGNOSIS — F17210 Nicotine dependence, cigarettes, uncomplicated: Secondary | ICD-10-CM | POA: Insufficient documentation

## 2016-11-09 DIAGNOSIS — F1010 Alcohol abuse, uncomplicated: Secondary | ICD-10-CM | POA: Insufficient documentation

## 2016-11-09 DIAGNOSIS — W260XXA Contact with knife, initial encounter: Secondary | ICD-10-CM | POA: Insufficient documentation

## 2016-11-09 DIAGNOSIS — Z7982 Long term (current) use of aspirin: Secondary | ICD-10-CM | POA: Insufficient documentation

## 2016-11-09 DIAGNOSIS — Z79899 Other long term (current) drug therapy: Secondary | ICD-10-CM | POA: Insufficient documentation

## 2016-11-09 DIAGNOSIS — S61219A Laceration without foreign body of unspecified finger without damage to nail, initial encounter: Secondary | ICD-10-CM

## 2016-11-09 DIAGNOSIS — Z23 Encounter for immunization: Secondary | ICD-10-CM | POA: Insufficient documentation

## 2016-11-09 DIAGNOSIS — Y9389 Activity, other specified: Secondary | ICD-10-CM | POA: Insufficient documentation

## 2016-11-09 MED ORDER — TETANUS-DIPHTH-ACELL PERTUSSIS 5-2.5-18.5 LF-MCG/0.5 IM SUSP
INTRAMUSCULAR | Status: AC
  Administered 2016-11-09: 0.5 mL via INTRAMUSCULAR
  Filled 2016-11-09: qty 0.5

## 2016-11-09 MED ORDER — TETANUS-DIPHTHERIA TOXOIDS TD 5-2 LFU IM INJ
0.5000 mL | INJECTION | Freq: Once | INTRAMUSCULAR | Status: DC
  Filled 2016-11-09: qty 0.5

## 2016-11-09 NOTE — Discharge Instructions (Signed)
Wear splint for 2-3 days as needed.

## 2016-11-09 NOTE — ED Notes (Signed)
AAOx3.  Skin warm and dry.  NAD 

## 2016-11-09 NOTE — ED Triage Notes (Signed)
Cut finger while cutting onion 2 hours ago. R index.

## 2016-11-09 NOTE — ED Provider Notes (Signed)
[facility] Emergency Department Provider Note   ____________________________________________   First MD Initiated Contact with Patient 11/09/16 1348     (approximate)  I have reviewed the triage vital signs and the nursing notes.   HISTORY  Chief Complaint Laceration    HPI [name] is a 61 y.o. male patient presents with a laceration to the MPJ dorsal aspect second digit left hand. Patient state this is a knife cut while peeling onions prior to arrival. Bleeding controlled direct pressure. Patient denies loss sensation or loss of function of the affected digit.Patient rates his pain discomfort as a 7/10. Describes pain as "achy". Patient states tetanus shot is not up-to-date.   Past Medical History:  Diagnosis Date  . Acid reflux   . Alcohol abuse   . Arthritis    "left pinky" (10/17/2014)  . DDD (degenerative disc disease), lumbar   . Gastroenteritis   . History of stress test 2001   no ischemia  . Hypertension   . Tobacco abuse   . Vertebral compression fracture (HCC)    T11, L1    Patient Active Problem List   Diagnosis Date Noted  . Bilateral recurrent inguinal hernia without obstruction or gangrene   . Influenza 10/18/2014  . Hypoxia 10/16/2014  . SOB (shortness of breath) 10/16/2014  . Cough 10/16/2014  . Fever 10/16/2014  . Sepsis ([place]) 10/16/2014  . Nausea and vomiting 10/16/2014  . Acid reflux   . DDD (degenerative disc disease), lumbar   . Hypertension   . Tobacco abuse   . Alcohol abuse   . Essential hypertension     Past Surgical History:  Procedure Laterality Date  . COLON SURGERY    . EXPLORATORY LAPAROTOMY W/ BOWEL RESECTION  1980's    Prior to Admission medications   Medication Sig Start Date End Date Taking? Authorizing Provider  aspirin 81 MG tablet Take 1 tablet (81 mg total) by mouth daily. Patient not taking: Reported on 03/05/2016 10/18/14   Domenic Polite, MD  cyclobenzaprine (FLEXERIL)  5 MG tablet Take 1-2 tablets (5-10 mg total) by mouth 3 (three) times daily as needed for muscle spasms. 09/04/16   [name], [name]  HYDROcodone-acetaminophen (NORCO) 5-325 MG tablet Take 1 tablet by mouth every 6 (six) hours as needed for moderate pain. 09/04/16   [name], [name]  nicotine (NICODERM CQ - DOSED IN MG/24 HOURS) 21 mg/24hr patch Place 1 patch (21 mg total) onto the skin daily. Patient not taking: Reported on 03/05/2016 10/18/14   Domenic Polite, MD  omeprazole (PRILOSEC) 20 MG capsule Take 20 mg by mouth daily.    [provider]  ondansetron (ZOFRAN) 4 MG tablet Take 1 tablet (4 mg total) by mouth every 8 (eight) hours as needed for nausea or vomiting. 03/05/16   [name], MD  oseltamivir (TAMIFLU) 75 MG capsule Take 1 capsule (75 mg total) by mouth 2 (two) times daily. For 3days Patient not taking: Reported on 03/05/2016 10/18/14   Domenic Polite, MD  oxyCODONE-acetaminophen (ROXICET) 5-325 MG tablet Take 1 tablet by mouth every 4 (four) hours as needed for severe pain. Patient not taking: Reported on 03/05/2016 02/24/16   Rudene Re, MD  predniSONE (DELTASONE) 10 MG tablet Take 1 tablet (10 mg total) by mouth daily. 6,5,4,3,2,1 six day taper 09/04/16   [name], [name]  tetrahydrozoline-zinc (VISINE-AC) 0.05-0.25 % ophthalmic solution Place 1 drop into both eyes 2 (two) times daily as needed (for allergies.).  [provider]  UNABLE TO FIND This note is to excuse Mr.Ivens from work 4/9 -4/17 due to medical illness requiring hospitalization Patient not taking: Reported on 03/05/2016 10/18/14   Domenic Polite, MD    Allergies Penicillins and Morphine and related  Family History  Problem Relation Age of Onset  . Diabetes Mother   . Hypertension Mother   . Stroke Father   . Heart failure Father   . Heart failure Other     Social History Social History  Substance Use Topics  . Smoking status: Current Every Day Smoker     Packs/day: 0.50    Years: 27.00    Types: Cigarettes  . Smokeless tobacco: Never Used  . Alcohol use 38.4 oz/week    15 Cans of beer, 44 Shots of liquor, 5 Standard drinks or equivalent per week     Comment: 4/11/23016 "I drink none to 1 pint of gin/day; 40oz beer when I'm drinking; occasional wine; probably 4 pints/wk; 4, 40oz beers/wk"    Review of Systems  Constitutional: No fever/chills Eyes: No visual changes. ENT: No sore throat. Cardiovascular: Denies chest pain. Respiratory: Denies shortness of breath. Gastrointestinal: No abdominal pain.  No nausea, no vomiting.  No diarrhea.  No constipation. Genitourinary: Negative for dysuria. Musculoskeletal: Negative for back pain. Skin: Negative for rash. Neurological: Negative for headaches, focal weakness or numbness. Psychiatric:Alcohol abuse Endocrine:Hypertension Allergic/Immunilogical: Penicillin and morphine ____________________________________________   PHYSICAL EXAM:  VITAL SIGNS: ED Triage Vitals  Enc Vitals Group     BP 11/09/16 1337 (!) 142/92     Pulse Rate 11/09/16 1337 (!) 110     Resp 11/09/16 1337 20     Temp 11/09/16 1337 98 F (36.7 C)     Temp Source 11/09/16 1337 Oral     SpO2 11/09/16 1337 97 %     Weight 11/09/16 1338 217 lb (98.4 kg)     Height 11/09/16 1338 5\' 11"  (1.803 m)     Head Circumference --      Peak Flow --      Pain Score 11/09/16 1337 7     Pain Loc --      Pain Edu? --      Excl. in [place]? --     Constitutional: Alert and oriented. Well appearing and in no acute distress. Eyes: Conjunctivae are normal. PERRL. EOMI. Head: Atraumatic. Nose: No congestion/rhinnorhea. Mouth/Throat: Mucous membranes are moist.  Oropharynx non-erythematous. Neck: No stridor. No cervical spine tenderness to palpation. Hematological/Lymphatic/Immunilogical: No cervical lymphadenopathy. Cardiovascular: Tachycardic. Grossly normal heart sounds.  Good peripheral circulation. Respiratory: Normal  respiratory effort.  No retractions. Lungs CTAB. Gastrointestinal: Soft and nontender. No distention. No abdominal bruits. No CVA tenderness. Musculoskeletal: No lower extremity tenderness nor edema.  No joint effusions. Neurologic:  Normal speech and language. No gross focal neurologic deficits are appreciated. No gait instability. Skin:  Skin is warm, dry and intact. No rash noted. 0.3 cm laceration dorsal aspect second digit left hand. Psychiatric: Mood and affect are normal. Speech and behavior are normal.  ____________________________________________   LABS (all labs ordered are listed, but only abnormal results are displayed)  Labs Reviewed - No data to display ____________________________________________  EKG   ____________________________________________  RADIOLOGY   ____________________________________________   PROCEDURES  Procedure(s) performed: LACERATION REPAIR Performed by: Sable Feil Authorized by: Sable Feil Consent: Verbal consent obtained. Risks and benefits: risks, benefits and alternatives were discussed Consent given by: patient Patient identity confirmed: provided demographic data Prepped and Draped  in normal sterile fashion Wound explored  Laceration Location: Second digit left hand  Laceration Length: 0.3 cm  No Foreign Bodies seen or palpated  Irrigation method: syringe Amount of cleaning: standard  Skin closure: Dermabond   Patient tolerance: Patient tolerated the procedure well with no immediate complications. Finger splint placed after laceration closure.   Procedures  Critical Care performed: No  ____________________________________________   INITIAL IMPRESSION / ASSESSMENT AND PLAN / ED COURSE  Pertinent labs & imaging results that were available during my care of the patient were reviewed by me and considered in my medical decision making (see chart for details).  Laceration second digit left hand. Patient given  discharge care instructions. Patient given a tetanus shot prior to departure. Advised return back to ED if wound re-opens before healing is completed      ____________________________________________   FINAL CLINICAL IMPRESSION(S) / ED DIAGNOSES  Final diagnoses:  Finger laceration, initial encounter      NEW MEDICATIONS STARTED DURING THIS VISIT:  New Prescriptions   No medications on file     Note:  This document was prepared using Dragon voice recognition software and may include unintentional dictation errors.    Sable Feil, [name] 11/09/16 1411    Lisa Roca, MD 11/09/16 [phone]

## 2016-11-10 ENCOUNTER — Emergency Department
Admission: EM | Admit: 2016-11-10 | Discharge: 2016-11-10 | Disposition: A | Discharge status: Home / Self Care | Payer: Self-pay | Attending: Emergency Medicine | Admitting: Emergency Medicine

## 2016-11-10 ENCOUNTER — Encounter: Payer: Self-pay | Admitting: *Deleted

## 2016-11-10 DIAGNOSIS — Y939 Activity, unspecified: Secondary | ICD-10-CM | POA: Insufficient documentation

## 2016-11-10 DIAGNOSIS — Y929 Unspecified place or not applicable: Secondary | ICD-10-CM | POA: Insufficient documentation

## 2016-11-10 DIAGNOSIS — Y999 Unspecified external cause status: Secondary | ICD-10-CM | POA: Insufficient documentation

## 2016-11-10 DIAGNOSIS — F17210 Nicotine dependence, cigarettes, uncomplicated: Secondary | ICD-10-CM | POA: Insufficient documentation

## 2016-11-10 DIAGNOSIS — S61211A Laceration without foreign body of left index finger without damage to nail, initial encounter: Secondary | ICD-10-CM | POA: Insufficient documentation

## 2016-11-10 DIAGNOSIS — Z7982 Long term (current) use of aspirin: Secondary | ICD-10-CM | POA: Insufficient documentation

## 2016-11-10 DIAGNOSIS — I10 Essential (primary) hypertension: Secondary | ICD-10-CM | POA: Insufficient documentation

## 2016-11-10 DIAGNOSIS — X58XXXA Exposure to other specified factors, initial encounter: Secondary | ICD-10-CM | POA: Insufficient documentation

## 2016-11-10 MED ORDER — SULFAMETHOXAZOLE-TRIMETHOPRIM 800-160 MG PO TABS: 1.0000 | ORAL_TABLET | Freq: Two times a day (BID) | ORAL | 0 refills | Status: AC

## 2016-11-10 MED ORDER — LIDOCAINE HCL (PF) 1 % IJ SOLN
INTRAMUSCULAR | Status: DC
Start: 2016-11-10 — End: 2016-11-11
  Filled 2016-11-10: qty 5

## 2016-11-10 MED ORDER — LIDOCAINE HCL 1 % IJ SOLN
5.0000 mL | Freq: Once | INTRAMUSCULAR | Status: AC
  Administered 2016-11-10: 1.5 mL
  Filled 2016-11-10: qty 5

## 2016-11-10 NOTE — ED Triage Notes (Signed)
Pt was seen yesterday for laceration to finger, dermabond placed yesterday, area on left index finger is open

## 2016-11-11 NOTE — ED Provider Notes (Signed)
[facility] Emergency Department Provider Note  ____________________________________________  Time seen: Approximately 4:29 PM  I have reviewed the triage vital signs and the nursing notes.   HISTORY  Chief Complaint Finger Injury    HPI [name] is a 61 y.o. male presenting to the emergency department with a 1.5 laceration of the left index finger at the IP joint that failed repair with Dermabond after an [facility] encounter on 11/09/2016. Patient states that laceration has occurred for less than 24 hours. Patient has been wearing his splint as directed. Patient denies radiculopathy, weakness, fever and chills. No new alleviating measures had been undertaken.   Past Medical History:  Diagnosis Date  . Acid reflux   . Alcohol abuse   . Arthritis    "left pinky" (10/17/2014)  . DDD (degenerative disc disease), lumbar   . Gastroenteritis   . History of stress test 2001   no ischemia  . Hypertension   . Tobacco abuse   . Vertebral compression fracture (HCC)    T11, L1    Patient Active Problem List   Diagnosis Date Noted  . Bilateral recurrent inguinal hernia without obstruction or gangrene   . Influenza 10/18/2014  . Hypoxia 10/16/2014  . SOB (shortness of breath) 10/16/2014  . Cough 10/16/2014  . Fever 10/16/2014  . Sepsis ([place]) 10/16/2014  . Nausea and vomiting 10/16/2014  . Acid reflux   . DDD (degenerative disc disease), lumbar   . Hypertension   . Tobacco abuse   . Alcohol abuse   . Essential hypertension     Past Surgical History:  Procedure Laterality Date  . COLON SURGERY    . EXPLORATORY LAPAROTOMY W/ BOWEL RESECTION  1980's    Prior to Admission medications   Medication Sig Start Date End Date Taking? Authorizing Provider  aspirin 81 MG tablet Take 1 tablet (81 mg total) by mouth daily. Patient not taking: Reported on 03/05/2016 10/18/14   Domenic Polite, MD  cyclobenzaprine (FLEXERIL) 5 MG tablet Take 1-2 tablets  (5-10 mg total) by mouth 3 (three) times daily as needed for muscle spasms. 09/04/16   [name], [name]  HYDROcodone-acetaminophen (NORCO) 5-325 MG tablet Take 1 tablet by mouth every 6 (six) hours as needed for moderate pain. 09/04/16   [name], [name]  nicotine (NICODERM CQ - DOSED IN MG/24 HOURS) 21 mg/24hr patch Place 1 patch (21 mg total) onto the skin daily. Patient not taking: Reported on 03/05/2016 10/18/14   Domenic Polite, MD  omeprazole (PRILOSEC) 20 MG capsule Take 20 mg by mouth daily.    [provider]  ondansetron (ZOFRAN) 4 MG tablet Take 1 tablet (4 mg total) by mouth every 8 (eight) hours as needed for nausea or vomiting. 03/05/16   [name], MD  oseltamivir (TAMIFLU) 75 MG capsule Take 1 capsule (75 mg total) by mouth 2 (two) times daily. For 3days Patient not taking: Reported on 03/05/2016 10/18/14   Domenic Polite, MD  oxyCODONE-acetaminophen (ROXICET) 5-325 MG tablet Take 1 tablet by mouth every 4 (four) hours as needed for severe pain. Patient not taking: Reported on 03/05/2016 02/24/16   Rudene Re, MD  predniSONE (DELTASONE) 10 MG tablet Take 1 tablet (10 mg total) by mouth daily. 6,5,4,3,2,1 six day taper 09/04/16   [name], [name]  sulfamethoxazole-trimethoprim (BACTRIM DS) 800-160 MG tablet Take 1 tablet by mouth 2 (two) times daily. 11/10/16 11/17/16  Lannie Fields, [name]  tetrahydrozoline-zinc (VISINE-AC) 0.05-0.25 % ophthalmic solution Place  1 drop into both eyes 2 (two) times daily as needed (for allergies.).    [provider]  UNABLE TO FIND This note is to excuse Mr.[employer] from work 4/9 -4/17 due to medical illness requiring hospitalization Patient not taking: Reported on 03/05/2016 10/18/14   Domenic Polite, MD    Allergies Penicillins and Morphine and related  Family History  Problem Relation Age of Onset  . Diabetes Mother   . Hypertension Mother   . Stroke Father   . Heart failure Father   . Heart failure  Other     Social History Social History  Substance Use Topics  . Smoking status: Current Every Day Smoker    Packs/day: 0.50    Years: 27.00    Types: Cigarettes  . Smokeless tobacco: Never Used  . Alcohol use 38.4 oz/week    15 Cans of beer, 44 Shots of liquor, 5 Standard drinks or equivalent per week     Comment: 4/11/23016 "I drink none to 1 pint of gin/day; 40oz beer when I'm drinking; occasional wine; probably 4 pints/wk; 4, 40oz beers/wk"     Review of Systems  Constitutional: No fever/chills Eyes: No visual changes. No discharge ENT: No upper respiratory complaints. Cardiovascular: no chest pain. Respiratory: no cough. No SOB. Gastrointestinal: No abdominal pain.  No nausea, no vomiting.  No diarrhea.  No constipation. Musculoskeletal: Negative for musculoskeletal pain. Skin: Patient has laceration of left first digit. Neurological: Negative for headaches, focal weakness or numbness.   ____________________________________________   PHYSICAL EXAM:  VITAL SIGNS: ED Triage Vitals  Enc Vitals Group     BP 11/10/16 2101 130/90     Pulse Rate 11/10/16 2101 90     Resp 11/10/16 2101 20     Temp 11/10/16 2101 98.2 F (36.8 C)     Temp Source 11/10/16 2101 Oral     SpO2 11/10/16 2101 98 %     Weight 11/10/16 2056 217 lb (98.4 kg)     Height 11/10/16 2056 5\' 11"  (1.803 m)     Head Circumference --      Peak Flow --      Pain Score 11/10/16 2055 10     Pain Loc --      Pain Edu? --      Excl. in [place]? --      Constitutional: Alert and oriented. Well appearing and in no acute distress. Eyes: Conjunctivae are normal. PERRL. EOMI. Head: Atraumatic. Cardiovascular: Normal rate, regular rhythm. Normal S1 and S2.  Good peripheral circulation. Respiratory: Normal respiratory effort without tachypnea or retractions. Lungs CTAB. Good air entry to the bases with no decreased or absent breath sounds. Musculoskeletal: Full range of motion to all extremities. No gross  deformities appreciated. Neurologic:  Normal speech and language. No gross focal neurologic deficits are appreciated.  Skin: Patient has a 1-1/2 cm laceration overlying the IP joint of the left first digit. Psychiatric: Mood and affect are normal. Speech and behavior are normal. Patient exhibits appropriate insight and judgement.   ____________________________________________   LABS (all labs ordered are listed, but only abnormal results are displayed)  Labs Reviewed - No data to display ____________________________________________  EKG   ____________________________________________  RADIOLOGY   No results found.  ____________________________________________    PROCEDURES  Procedure(s) performed:    Procedures    Medications  lidocaine (XYLOCAINE) 1 % (with pres) injection 5 mL (1.5 mLs Infiltration Given 11/10/16 2220)   LACERATION REPAIR Performed by: Lannie Fields Authorized by: Ellison Hughs  [place] Plan Consent: Verbal consent obtained. Risks and benefits: risks, benefits and alternatives were discussed Consent given by: patient Patient identity confirmed: provided demographic data Prepped and Draped in normal sterile fashion Wound explored  Laceration Location: Left first digit  Laceration Length: 1.5 cm  No Foreign Bodies seen or palpated  Anesthesia: local infiltration  Local anesthetic: lidocaine 1% without epinephrine  Anesthetic total: 1 ml  Irrigation method: syringe Amount of cleaning: standard  Skin closure: 4-0 Ethilon   Number of sutures: 4  Technique: Simple interrupted  Patient tolerance: Patient tolerated the procedure well with no immediate complications.   ____________________________________________   INITIAL IMPRESSION / ASSESSMENT AND PLAN / ED COURSE  Pertinent labs & imaging results that were available during my care of the patient were reviewed by me and considered in my medical decision making (see chart for  details).  Review of the  [place] was performed in accordance of the [place] prior to dispensing any controlled drugs.     Assessment and plan: Left First Digit Laceration Patient presents to the emergency department with a 1-1/2 cm laceration of the skin overlying the IP joint of the left first digit. Patient underwent laceration repair in the emergency department as laceration has occurred for less than 24 hours. No complications occurred during laceration repair. Patient was discharged with Bactrim. Patient was advised to have sutures removed by primary care in 8 days. Patient voiced understanding regarding these recommendations. All patient questions were answered.   ____________________________________________  FINAL CLINICAL IMPRESSION(S) / ED DIAGNOSES  Final diagnoses:  Laceration of left index finger without foreign body without damage to nail, initial encounter      NEW MEDICATIONS STARTED DURING THIS VISIT:  Discharge Medication List as of 11/10/2016 10:49 PM    START taking these medications   Details  sulfamethoxazole-trimethoprim (BACTRIM DS) 800-160 MG tablet Take 1 tablet by mouth 2 (two) times daily., Starting Sun 11/10/2016, Until Sun 11/17/2016, Print            This chart was dictated using voice recognition software/Dragon. Despite best efforts to proofread, errors can occur which can change the meaning. Any change was purely unintentional.    Lannie Fields, [name] 11/11/16 1647    Orbie Pyo, MD 11/14/16 [phone]

## 2016-11-15 ENCOUNTER — Emergency Department (HOSPITAL_COMMUNITY)
Admission: EM | Admit: 2016-11-15 | Discharge: 2016-11-16 | Disposition: A | Discharge status: Home / Self Care | Payer: Self-pay | Attending: Emergency Medicine | Admitting: Emergency Medicine

## 2016-11-15 ENCOUNTER — Encounter (HOSPITAL_COMMUNITY): Payer: Self-pay

## 2016-11-15 ENCOUNTER — Emergency Department (HOSPITAL_COMMUNITY): Payer: Self-pay

## 2016-11-15 DIAGNOSIS — I10 Essential (primary) hypertension: Secondary | ICD-10-CM | POA: Insufficient documentation

## 2016-11-15 DIAGNOSIS — F17210 Nicotine dependence, cigarettes, uncomplicated: Secondary | ICD-10-CM | POA: Insufficient documentation

## 2016-11-15 DIAGNOSIS — Z79899 Other long term (current) drug therapy: Secondary | ICD-10-CM | POA: Insufficient documentation

## 2016-11-15 DIAGNOSIS — R072 Precordial pain: Secondary | ICD-10-CM | POA: Insufficient documentation

## 2016-11-15 LAB — CBC
HCT: 41.1 % (ref 39.0–52.0)
Hemoglobin: 13.7 g/dL (ref 13.0–17.0)
MCH: 29.4 pg (ref 26.0–34.0)
MCHC: 33.3 g/dL (ref 30.0–36.0)
MCV: 88.2 fL (ref 78.0–100.0)
Platelets: 171 10*3/uL (ref 150–400)
RBC: 4.66 MIL/uL (ref 4.22–5.81)
RDW: 15.7 % — ABNORMAL HIGH (ref 11.5–15.5)
WBC: 4.7 10*3/uL (ref 4.0–10.5)

## 2016-11-15 LAB — BASIC METABOLIC PANEL
ANION GAP: 9 (ref 5–15)
BUN: 10 mg/dL (ref 6–20)
CHLORIDE: 109 mmol/L (ref 101–111)
CO2: 21 mmol/L — AB (ref 22–32)
Calcium: 8.9 mg/dL (ref 8.9–10.3)
Creatinine, Ser: 1.25 mg/dL — ABNORMAL HIGH (ref 0.61–1.24)
GFR calc non Af Amer: >60 mL/min (ref >60)
Glucose, Bld: 107 mg/dL — ABNORMAL HIGH (ref 65–99)
Potassium: 4 mmol/L (ref 3.5–5.1)
Sodium: 139 mmol/L (ref 135–145)

## 2016-11-15 LAB — I-STAT TROPONIN, ED: Troponin i, poc: 0 ng/mL (ref 0.00–0.08)

## 2016-11-15 NOTE — ED Provider Notes (Signed)
[facility] DEPT Provider Note    By signing my name below, I, Bea Graff, attest that this documentation has been prepared under the direction and in the presence of Jola Schmidt, MD. Electronically Signed: Bea Graff, ED Scribe. 11/16/2016. 3:31 AM.    History   Chief Complaint Chief Complaint  Patient presents with  . Chest Pain   The history is provided by the patient and medical records. No language interpreter was used.    HPI Comments:  [name] is a 61 y.o. male with PMHx of HTN, ETOH abuse, brought in by EMS, who presents to the Emergency Department complaining of chest tightness that began PTA. He reports associated light headedness, nausea, diaphoresis. He states he was sitting down watching television when the symptoms began and lasted about 15 minutes. He has not taken anything for pain relief but was given ASA 324 mg by EMS. There are no modifying factors noted. He denies vomiting. He takes medication daily for GERD. He denies h/o HTN, HLD or DM. He denies ever having a heart catheterization but has had a stress test in the 1990s. He reports mowing the lawn with a push mower (takes about 1.5 hours to complete) earlier today without issue. He reports smoking cigarettes daily (approximately 0.5 PPD) for the past 20 years. He reports ETOH use. He is not currently working as of yesterday. He does not currently have a PCP.    Past Medical History:  Diagnosis Date  . Acid reflux   . Alcohol abuse   . Arthritis    "left pinky" (10/17/2014)  . DDD (degenerative disc disease), lumbar   . Gastroenteritis   . History of stress test 2001   no ischemia  . Hypertension   . Tobacco abuse   . Vertebral compression fracture (HCC)    T11, L1    Patient Active Problem List   Diagnosis Date Noted  . Bilateral recurrent inguinal hernia without obstruction or gangrene   . Influenza 10/18/2014  . Hypoxia 10/16/2014  . SOB (shortness of breath)  10/16/2014  . Cough 10/16/2014  . Fever 10/16/2014  . Sepsis ([place]) 10/16/2014  . Nausea and vomiting 10/16/2014  . Acid reflux   . DDD (degenerative disc disease), lumbar   . Hypertension   . Tobacco abuse   . Alcohol abuse   . Essential hypertension     Past Surgical History:  Procedure Laterality Date  . COLON SURGERY    . EXPLORATORY LAPAROTOMY W/ BOWEL RESECTION  1980's       Home Medications    Prior to Admission medications   Medication Sig Start Date End Date Taking? Authorizing Provider  cyclobenzaprine (FLEXERIL) 5 MG tablet Take 1-2 tablets (5-10 mg total) by mouth 3 (three) times daily as needed for muscle spasms. 09/04/16  Yes [name], [name]  HYDROcodone-acetaminophen (NORCO) 5-325 MG tablet Take 1 tablet by mouth every 6 (six) hours as needed for moderate pain. 09/04/16  Yes [name], [name]  omeprazole (PRILOSEC) 20 MG capsule Take 20 mg by mouth daily.   Yes [provider]  ondansetron (ZOFRAN) 4 MG tablet Take 1 tablet (4 mg total) by mouth every 8 (eight) hours as needed for nausea or vomiting. 03/05/16  Yes McShane, Gerda Diss, MD  sulfamethoxazole-trimethoprim (BACTRIM DS) 800-160 MG tablet Take 1 tablet by mouth 2 (two) times daily. 11/10/16 11/17/16 Yes Vallarie Mare M, [name]  tetrahydrozoline-zinc (VISINE-AC) 0.05-0.25 % ophthalmic solution Place 1 drop into both eyes 2 (two) times  daily as needed (for allergies.).   Yes [provider]    Family History Family History  Problem Relation Age of Onset  . Diabetes Mother   . Hypertension Mother   . Stroke Father   . Heart failure Father   . Heart failure Other     Social History Social History  Substance Use Topics  . Smoking status: Current Every Day Smoker    Packs/day: 0.50    Years: 27.00    Types: Cigarettes  . Smokeless tobacco: Never Used  . Alcohol use 38.4 oz/week    15 Cans of beer, 44 Shots of liquor, 5 Standard drinks or equivalent per week     Comment:  4/11/23016 "I drink none to 1 pint of gin/day; 40oz beer when I'm drinking; occasional wine; probably 4 pints/wk; 4, 40oz beers/wk"     Allergies   Penicillins and Morphine and related   Review of Systems Review of Systems All other systems reviewed and are negative for acute change except as noted in the HPI.   Physical Exam Updated Vital Signs Ht 5\' 11"  (1.803 m)   Wt 220 lb (99.8 kg)   SpO2 97%   BMI 30.68 kg/m   Physical Exam  Constitutional: He is oriented to person, place, and time. He appears well-developed and well-nourished.  HENT:  Head: Normocephalic.  Eyes: EOM are normal.  Neck: Normal range of motion.  Cardiovascular: Normal rate, regular rhythm and normal heart sounds.   No murmur heard. Pulmonary/Chest: Effort normal and breath sounds normal. No respiratory distress.  Abdominal: He exhibits no distension.  Musculoskeletal: Normal range of motion.  Neurological: He is alert and oriented to person, place, and time.  Psychiatric: He has a normal mood and affect.  Nursing note and vitals reviewed.    ED Treatments / Results  DIAGNOSTIC STUDIES: Oxygen Saturation is 97% on RA, normal by my interpretation.   COORDINATION OF CARE: 12:04 AM- Will recheck labs. Pt verbalizes understanding and agrees to plan.  Medications - No data to display    Labs (all labs ordered are listed, but only abnormal results are displayed) Labs Reviewed  BASIC METABOLIC PANEL - Abnormal; Notable for the following:       Result Value   CO2 21 (*)    Glucose, Bld 107 (*)    Creatinine, Ser 1.25 (*)    All other components within normal limits  CBC - Abnormal; Notable for the following:    RDW 15.7 (*)    All other components within normal limits  I-STAT TROPOININ, ED  I-STAT TROPOININ, ED    EKG  EKG Interpretation #1  Date/Time:  Friday Nov 15 2016 22:47:59 EDT Ventricular Rate:  95 PR Interval:    QRS Duration: 138 QT Interval:  367 QTC Calculation: 462 R  Axis:   -93 Text Interpretation:  Sinus rhythm RBBB and LAFB Lateral infarct, acute ST elevation, consider inferior injury Baseline wander in lead(s) II III aVF V3 V6 No significant change was found Confirmed by Artis Beggs  MD, Virl Coble ([id number]) on 11/15/2016 11:04:11 PM         EKG Interpretation #2  Date/Time:  Friday Nov 15 2016 22:47:59 EDT Ventricular Rate:  95 PR Interval:    QRS Duration: 138 QT Interval:  367 QTC Calculation: 462 R Axis:   -93 Text Interpretation:  Sinus rhythm RBBB and LAFB Lateral infarct, acute ST elevation, consider inferior injury Baseline wander in lead(s) II III aVF V3 V6 No significant change was  found Confirmed by Mahin Guardia  MD, Lennette Bihari ([id number]) on 11/15/2016 11:04:11 PM        Radiology Dg Chest 2 View  Result Date: 11/15/2016 CLINICAL DATA:  Central chest pain at rest around 930 tonight. History of hypertension. EXAM: CHEST  2 VIEW COMPARISON:  CT chest 10/16/2014.  Chest 10/15/2014 FINDINGS: Slightly shallow inspiration. Heart size and pulmonary vascularity are normal. No focal consolidation or edema. No blunting of costophrenic angles. No pneumothorax. Mild anterior wedge deformities of multiple thoracic vertebrae, similar to previous study. Tortuous aorta. IMPRESSION: No active cardiopulmonary disease. Electronically Signed   By: Lucienne Capers M.D.   On: 11/15/2016 23:42    Procedures Procedures (including critical care time)  Medications Ordered in ED Medications - No data to display   Initial Impression / Assessment and Plan / ED Course  I have reviewed the triage vital signs and the nursing notes.  Pertinent labs & imaging results that were available during my care of the patient were reviewed by me and considered in my medical decision making (see chart for details).     Patient with 2 EKG is without ischemic changes.  Troponin negative 2.  Both typical and atypical components to his pain.  I do not think he needs additional workup while in the  emergency department.  He does not need admission the hospital at this time.  He is pain-free.  Outpatient cardiology follow-up.  He understands to return to the ER for new or worsening symptoms  Final Clinical Impressions(s) / ED Diagnoses   Final diagnoses:  None    New Prescriptions New Prescriptions   No medications on file    I personally performed the services described in this documentation, which was scribed in my presence. The recorded information has been reviewed and is accurate.       Jola Schmidt, MD 11/16/16 [phone]

## 2016-11-15 NOTE — ED Triage Notes (Signed)
Pt BIB [facility] from home where pt reports having central CP at rest around 930 tonight. 324 Asprin given PTA.

## 2016-11-16 LAB — I-STAT TROPONIN, ED: TROPONIN I, POC: 0.01 ng/mL (ref 0.00–0.08)

## 2016-11-16 NOTE — ED Notes (Signed)
Campos MD at bedside. 

## 2016-11-28 DIAGNOSIS — G5603 Carpal tunnel syndrome, bilateral upper limbs: Secondary | ICD-10-CM | POA: Insufficient documentation

## 2016-11-28 DIAGNOSIS — I10 Essential (primary) hypertension: Secondary | ICD-10-CM | POA: Insufficient documentation

## 2016-11-28 DIAGNOSIS — F17210 Nicotine dependence, cigarettes, uncomplicated: Secondary | ICD-10-CM | POA: Insufficient documentation

## 2016-11-29 ENCOUNTER — Emergency Department
Admission: EM | Admit: 2016-11-29 | Discharge: 2016-11-29 | Disposition: A | Discharge status: Home / Self Care | Payer: Self-pay | Attending: Student in an Organized Health Care Education/Training Program | Admitting: Student in an Organized Health Care Education/Training Program

## 2016-11-29 DIAGNOSIS — G5603 Carpal tunnel syndrome, bilateral upper limbs: Secondary | ICD-10-CM

## 2016-11-29 MED ORDER — PREDNISONE 10 MG PO TABS: 10.0000 mg | ORAL_TABLET | Freq: Every day | ORAL | 0 refills | Status: DC

## 2016-11-29 MED ORDER — NAPROXEN 500 MG PO TABS
500.0000 mg | ORAL_TABLET | Freq: Once | ORAL | Status: AC
  Administered 2016-11-29: 500 mg via ORAL
  Filled 2016-11-29: qty 1

## 2016-11-29 MED ORDER — OXYCODONE-ACETAMINOPHEN 5-325 MG PO TABS
1.0000 | ORAL_TABLET | Freq: Once | ORAL | Status: AC
  Administered 2016-11-29: 1 via ORAL
  Filled 2016-11-29: qty 1

## 2016-11-29 MED ORDER — PREDNISONE 20 MG PO TABS
60.0000 mg | ORAL_TABLET | Freq: Once | ORAL | Status: AC
  Administered 2016-11-29: 60 mg via ORAL
  Filled 2016-11-29: qty 3

## 2016-11-29 MED ORDER — HYDROCODONE-ACETAMINOPHEN 5-325 MG PO TABS: 1.0000 | ORAL_TABLET | ORAL | 0 refills | Status: DC | PRN

## 2016-11-29 NOTE — ED Provider Notes (Signed)
[facility] Emergency Department Provider Note    First MD Initiated Contact with Patient 11/29/16 0354     (approximate)  I have reviewed the triage vital signs and the nursing notes.   HISTORY  Chief Complaint Hand Pain    HPI [name] is a 61 y.o. male history of carpal tunnel syndrome presents with bilateral hand pain and wrist pain that started this evening after he got off work. 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 States that he feels that he overdid it yesterday. Denies any chest pain or shortness of breath. Pain is isolated distal to the wrists. States it hurts worse with flexion of the wrists.  10/10 in nature.   Past Medical History:  Diagnosis Date  . Acid reflux   . Alcohol abuse   . Arthritis    "left pinky" (10/17/2014)  . DDD (degenerative disc disease), lumbar   . Gastroenteritis   . History of stress test 2001   no ischemia  . Hypertension   . Tobacco abuse   . Vertebral compression fracture (HCC)    T11, L1   Family History  Problem Relation Age of Onset  . Diabetes Mother   . Hypertension Mother   . Stroke Father   . Heart failure Father   . Heart failure Other    Past Surgical History:  Procedure Laterality Date  . COLON SURGERY    . EXPLORATORY LAPAROTOMY W/ BOWEL RESECTION  1980's   Patient Active Problem List   Diagnosis Date Noted  . Bilateral recurrent inguinal hernia without obstruction or gangrene   . Influenza 10/18/2014  . Hypoxia 10/16/2014  . SOB (shortness of breath) 10/16/2014  . Cough 10/16/2014  . Fever 10/16/2014  . Sepsis ([place]) 10/16/2014  . Nausea and vomiting 10/16/2014  . Acid reflux   . DDD (degenerative disc disease), lumbar   . Hypertension   . Tobacco abuse   . Alcohol abuse   . Essential hypertension       Prior to Admission medications   Medication Sig Start Date End Date Taking? Authorizing Provider    cyclobenzaprine (FLEXERIL) 5 MG tablet Take 1-2 tablets (5-10 mg total) by mouth 3 (three) times daily as needed for muscle spasms. 09/04/16   Troy Guess, [name]  HYDROcodone-acetaminophen (NORCO) 5-325 MG tablet Take 1 tablet by mouth every 6 (six) hours as needed for moderate pain. 09/04/16   Troy Guess, [name]  HYDROcodone-acetaminophen (NORCO) 5-325 MG tablet Take 1 tablet by mouth every 4 (four) hours as needed for moderate pain. 11/29/16   Troy Lot, MD  omeprazole (PRILOSEC) 20 MG capsule Take 20 mg by mouth daily.    [provider]  ondansetron (ZOFRAN) 4 MG tablet Take 1 tablet (4 mg total) by mouth every 8 (eight) hours as needed for nausea or vomiting. 03/05/16   [name], MD  predniSONE (DELTASONE) 10 MG tablet Take 1 tablet (10 mg total) by mouth daily. Day 1-2: Take 50 mg  ( 5 pills) Day 3-4 : Take 40 mg (4pills) Day 5-6: Take 30 mg (3 pills) Day 7-8:  Take 20 mg (2 pills) Day 9:  Take 10mg  (1 pill) 11/29/16   Troy Lot, MD  tetrahydrozoline-zinc (VISINE-AC) 0.05-0.25 % ophthalmic solution Place 1 drop into both eyes 2 (two) times daily as needed (for allergies.).    [provider]    Allergies Penicillins and  Morphine and related    Social History Social History  Substance Use Topics  . Smoking status: Current Every Day Smoker    Packs/day: 0.50    Years: 27.00    Types: Cigarettes  . Smokeless tobacco: Never Used  . Alcohol use 38.4 oz/week    15 Cans of beer, 44 Shots of liquor, 5 Standard drinks or equivalent per week     Comment: 4/11/23016 "I drink none to 1 pint of gin/day; 40oz beer when I'm drinking; occasional wine; probably 4 pints/wk; 4, 40oz beers/wk"    Review of Systems Patient denies headaches, rhinorrhea, blurry vision, numbness, shortness of breath, chest pain, edema, cough, abdominal pain, nausea, vomiting, diarrhea, dysuria, fevers, rashes or hallucinations unless otherwise stated above in  HPI. ____________________________________________   PHYSICAL EXAM:  VITAL SIGNS: Vitals:   11/28/16 2351 11/29/16 0401  BP: (!) 164/81 (!) 156/85  Pulse: 95 85  Resp: 16   Temp: 98.5 F (36.9 C)     Constitutional: Alert and oriented. Well appearing and in no acute distress. Eyes: Conjunctivae are normal. PERRL. EOMI. Head: Atraumatic. Nose: No congestion/rhinnorhea. Mouth/Throat: Mucous membranes are moist.  Oropharynx non-erythematous. Neck: No stridor. Painless ROM. No cervical spine tenderness to palpation Hematological/Lymphatic/Immunilogical: No cervical lymphadenopathy. Cardiovascular: Normal rate, regular rhythm. Grossly normal heart sounds.  Good peripheral circulation. Respiratory: Normal respiratory effort.  No retractions. Lungs CTAB. Gastrointestinal: Soft and nontender. No distention. No abdominal bruits. No CVA tenderness. Musculoskeletal: No lower extremity tenderness nor edema.  No joint effusions.  + phalens and tinel sign bilaterally.  Brisk cap refill distally, no erythema or effusion of wrist Neurologic:  Normal speech and language. No gross focal neurologic deficits are appreciated. No gait instability. Skin:  Skin is warm, dry and intact. No rash noted. Psychiatric: Mood and affect are normal. Speech and behavior are normal. ____________________________________________   LABS (all labs ordered are listed, but only abnormal results are displayed)  No results found for this or any previous visit (from the past 24 hour(s)). ____________________________________________ ____________________________________________  [id number]   ____________________________________________   PROCEDURES  Procedure(s) performed:  Procedures    Critical Care performed: no ____________________________________________   INITIAL IMPRESSION / ASSESSMENT AND PLAN / ED COURSE  Pertinent labs & imaging results that were available during my care of the patient were  reviewed by me and considered in my medical decision making (see chart for details).  DDX: carpal tunnel, infection, ischemia  [name] is a 61 y.o. who presents to the ED with bilateral carpal tunnel syndrome. Patient afebrile and hemodynamic stable. No evidence of infectious process. No traumatic injury. Patient will be started on prednisone and discussed anti-inflammatories as well as wrist splint with follow-up for orthopedics.      ____________________________________________   FINAL CLINICAL IMPRESSION(S) / ED DIAGNOSES  Final diagnoses:  Carpal tunnel syndrome, bilateral      NEW MEDICATIONS STARTED DURING THIS VISIT:  New Prescriptions   HYDROCODONE-ACETAMINOPHEN (NORCO) 5-325 MG TABLET    Take 1 tablet by mouth every 4 (four) hours as needed for moderate pain.   PREDNISONE (DELTASONE) 10 MG TABLET    Take 1 tablet (10 mg total) by mouth daily. Day 1-2: Take 50 mg  ( 5 pills) Day 3-4 : Take 40 mg (4pills) Day 5-6: Take 30 mg (3 pills) Day 7-8:  Take 20 mg (2 pills) Day 9:  Take 10mg  (1 pill)     Note:  This document was prepared using Dragon voice recognition software and may  include unintentional dictation errors.    Troy Lot, MD 11/29/16 [phone]

## 2016-11-29 NOTE — ED Triage Notes (Signed)
Pt in with co bilat hand pain since this, states does a lot of work with his hands. Has a hx of carpal tunnel and states "I over did it with my hands yesterday".

## 2017-02-06 ENCOUNTER — Encounter: Payer: Self-pay | Admitting: Emergency Medicine

## 2017-02-06 ENCOUNTER — Emergency Department
Admission: EM | Admit: 2017-02-06 | Discharge: 2017-02-06 | Disposition: A | Discharge status: Home / Self Care | Payer: Self-pay | Attending: Emergency Medicine | Admitting: Emergency Medicine

## 2017-02-06 DIAGNOSIS — I10 Essential (primary) hypertension: Secondary | ICD-10-CM | POA: Insufficient documentation

## 2017-02-06 DIAGNOSIS — Z79899 Other long term (current) drug therapy: Secondary | ICD-10-CM | POA: Insufficient documentation

## 2017-02-06 DIAGNOSIS — F10920 Alcohol use, unspecified with intoxication, uncomplicated: Secondary | ICD-10-CM | POA: Insufficient documentation

## 2017-02-06 LAB — CBC
HEMATOCRIT: 43.4 % (ref 40.0–52.0)
HEMOGLOBIN: 14.7 g/dL (ref 13.0–18.0)
MCH: 30.3 pg (ref 26.0–34.0)
MCHC: 33.9 g/dL (ref 32.0–36.0)
MCV: 89.2 fL (ref 80.0–100.0)
Platelets: 146 10*3/uL — ABNORMAL LOW (ref 150–440)
RBC: 4.86 MIL/uL (ref 4.40–5.90)
RDW: 16.5 % — AB (ref 11.5–14.5)
WBC: 4.4 10*3/uL (ref 3.8–10.6)

## 2017-02-06 LAB — COMPREHENSIVE METABOLIC PANEL
ALK PHOS: 46 U/L (ref 38–126)
ALT: 29 U/L (ref 17–63)
ANION GAP: 10 (ref 5–15)
AST: 42 U/L — ABNORMAL HIGH (ref 15–41)
Albumin: 4.4 g/dL (ref 3.5–5.0)
BUN: 7 mg/dL (ref 6–20)
CALCIUM: 8.9 mg/dL (ref 8.9–10.3)
CO2: 23 mmol/L (ref 22–32)
Chloride: 107 mmol/L (ref 101–111)
Creatinine, Ser: 1.02 mg/dL (ref 0.61–1.24)
GFR calc Af Amer: >60 mL/min (ref >60)
GFR calc non Af Amer: >60 mL/min (ref >60)
GLUCOSE: 99 mg/dL (ref 65–99)
Potassium: 3.5 mmol/L (ref 3.5–5.1)
SODIUM: 140 mmol/L (ref 135–145)
Total Bilirubin: 0.6 mg/dL (ref 0.3–1.2)
Total Protein: 7.2 g/dL (ref 6.5–8.1)

## 2017-02-06 LAB — TROPONIN I: Troponin I: <0.03 ng/mL (ref <0.03)

## 2017-02-06 LAB — ETHANOL: Alcohol, Ethyl (B): 283 mg/dL — ABNORMAL HIGH (ref <5)

## 2017-02-06 NOTE — ED Triage Notes (Addendum)
Patient ambulatory to triage with steady gait, without difficulty or distress noted; +ETOH, pt st feels light-headed; denies pain; pt reports that he took an uber here to ED

## 2017-02-06 NOTE — ED Provider Notes (Addendum)
[facility] Emergency Department Provider Note    First MD Initiated Contact with Patient 02/06/17 [phone]     (approximate)  I have reviewed the triage vital signs and the nursing notes.   HISTORY  Chief Complaint Dizziness    HPI [name] is a 61 y.o. male with below list of chronic medical conditions including alcohol abuse presents emergency Department with "feeling lightheaded this evening". Patient denies any headache no nausea or vomiting. Patient denies any weakness numbness gait instability or visual changes. Patient does admit to EtOH ingestion tonight stating "I did go hard a bit"   Past Medical History:  Diagnosis Date  . Acid reflux   . Alcohol abuse   . Arthritis    "left pinky" (10/17/2014)  . DDD (degenerative disc disease), lumbar   . Gastroenteritis   . History of stress test 2001   no ischemia  . Hypertension   . Tobacco abuse   . Vertebral compression fracture (HCC)    T11, L1    Patient Active Problem List   Diagnosis Date Noted  . Bilateral recurrent inguinal hernia without obstruction or gangrene   . Influenza 10/18/2014  . Hypoxia 10/16/2014  . SOB (shortness of breath) 10/16/2014  . Cough 10/16/2014  . Fever 10/16/2014  . Sepsis ([place]) 10/16/2014  . Nausea and vomiting 10/16/2014  . Acid reflux   . DDD (degenerative disc disease), lumbar   . Hypertension   . Tobacco abuse   . Alcohol abuse   . Essential hypertension     Past Surgical History:  Procedure Laterality Date  . COLON SURGERY    . EXPLORATORY LAPAROTOMY W/ BOWEL RESECTION  1980's    Prior to Admission medications   Medication Sig Start Date End Date Taking? Authorizing Provider  cyclobenzaprine (FLEXERIL) 5 MG tablet Take 1-2 tablets (5-10 mg total) by mouth 3 (three) times daily as needed for muscle spasms. 09/04/16   [name], [name]  HYDROcodone-acetaminophen (NORCO) 5-325 MG tablet Take 1 tablet by mouth every 6 (six) hours  as needed for moderate pain. 09/04/16   [name], [name]  HYDROcodone-acetaminophen (NORCO) 5-325 MG tablet Take 1 tablet by mouth every 4 (four) hours as needed for moderate pain. 11/29/16   [name], MD  omeprazole (PRILOSEC) 20 MG capsule Take 20 mg by mouth daily.    [provider]  ondansetron (ZOFRAN) 4 MG tablet Take 1 tablet (4 mg total) by mouth every 8 (eight) hours as needed for nausea or vomiting. 03/05/16   [name], MD  predniSONE (DELTASONE) 10 MG tablet Take 1 tablet (10 mg total) by mouth daily. Day 1-2: Take 50 mg  ( 5 pills) Day 3-4 : Take 40 mg (4pills) Day 5-6: Take 30 mg (3 pills) Day 7-8:  Take 20 mg (2 pills) Day 9:  Take 10mg  (1 pill) 11/29/16   [name], MD  tetrahydrozoline-zinc (VISINE-AC) 0.05-0.25 % ophthalmic solution Place 1 drop into both eyes 2 (two) times daily as needed (for allergies.).    [provider]    Allergies Penicillins and Morphine and related  Family History  Problem Relation Age of Onset  . Diabetes Mother   . Hypertension Mother   . Stroke Father   . Heart failure Father   . Heart failure Other     Social History Social History  Substance Use Topics  . Smoking status: Current Every Day Smoker    Packs/day: 0.50    Years:  27.00    Types: Cigarettes  . Smokeless tobacco: Never Used  . Alcohol use 38.4 oz/week    15 Cans of beer, 44 Shots of liquor, 5 Standard drinks or equivalent per week     Comment: 4/11/23016 "I drink none to 1 pint of gin/day; 40oz beer when I'm drinking; occasional wine; probably 4 pints/wk; 4, 40oz beers/wk"    Review of Systems Constitutional: No fever/chills Eyes: No visual changes. ENT: No sore throat. Cardiovascular: Denies chest pain. Respiratory: Denies shortness of breath. Gastrointestinal: No abdominal pain.  No nausea, no vomiting.  No diarrhea.  No constipation. Genitourinary: Negative for dysuria. Musculoskeletal: Negative for neck pain.   Negative for back pain. Integumentary: Negative for rash. Neurological: Negative for headaches, focal weakness or numbness.Positive for lightheadedness   ____________________________________________   PHYSICAL EXAM:  VITAL SIGNS: ED Triage Vitals  Enc Vitals Group     BP 02/06/17 0232 (!) 144/99     Pulse Rate 02/06/17 0232 (!) 112     Resp 02/06/17 0232 18     Temp 02/06/17 0232 98.7 F (37.1 C)     Temp Source 02/06/17 0232 Oral     SpO2 02/06/17 0232 95 %     Weight 02/06/17 0230 100.2 kg (221 lb)     Height 02/06/17 0230 1.778 m (5\' 10" )     Head Circumference --      Peak Flow --      Pain Score --      Pain Loc --      Pain Edu? --      Excl. in [place]? --     Constitutional: Alert and oriented. Well appearing and in no acute distress.EtOH on breath Eyes: Conjunctivae are normal. PERRL. EOMI. Head: Atraumatic. Mouth/Throat: Mucous membranes are moist.  Oropharynx non-erythematous. Neck: No stridor.  No meningeal signs.   Cardiovascular: Normal rate, regular rhythm. Good peripheral circulation. Grossly normal heart sounds. Respiratory: Normal respiratory effort.  No retractions. Lungs CTAB. Gastrointestinal: Soft and nontender. No distention.  Musculoskeletal: No lower extremity tenderness nor edema. No gross deformities of extremities. Neurologic:  Normal speech and language. No gross focal neurologic deficits are appreciated.  Skin:  Skin is warm, dry and intact. No rash noted. Psychiatric: Mood and affect are normal. Speech and behavior are normal.  ____________________________________________   LABS (all labs ordered are listed, but only abnormal results are displayed)  Labs Reviewed  CBC - Abnormal; Notable for the following:       Result Value   RDW 16.5 (*)    Platelets 146 (*)    All other components within normal limits  COMPREHENSIVE METABOLIC PANEL - Abnormal; Notable for the following:    AST 42 (*)    All other components within normal limits    ETHANOL - Abnormal; Notable for the following:    Alcohol, Ethyl (B) 283 (*)    All other components within normal limits  TROPONIN I   ____________________________________________  EKG  ED ECG REPORT I, [place] Anaissa Macfadden, the attending physician, personally viewed and interpreted this ECG.   Date: 02/06/2017  EKG Time: 2:34 AM  Rate: 110  Rhythm: Sinus tachycardia with right bundle-branch block  Axis: Normal  Intervals: Normal  ST&T Change: None    Procedures   ____________________________________________   INITIAL IMPRESSION / ASSESSMENT AND PLAN / ED COURSE  Pertinent labs & imaging results that were available during my care of the patient were reviewed by me and considered in my medical decision making (  see chart for details).        ____________________________________________  FINAL CLINICAL IMPRESSION(S) / ED DIAGNOSES  Final diagnoses:  Alcoholic intoxication without complication ([place])     MEDICATIONS GIVEN DURING THIS VISIT:  Medications - No data to display   NEW OUTPATIENT MEDICATIONS STARTED DURING THIS VISIT:  New Prescriptions   No medications on file    Modified Medications   No medications on file    Discontinued Medications   No medications on file     Note:  This document was prepared using Dragon voice recognition software and may include unintentional dictation errors.    Gregor Hams, MD 02/06/17 6825    Gregor Hams, MD 02/06/17 [phone]

## 2017-02-18 ENCOUNTER — Emergency Department
Admission: EM | Admit: 2017-02-18 | Discharge: 2017-02-18 | Disposition: A | Discharge status: Home / Self Care | Payer: Self-pay | Attending: Emergency Medicine | Admitting: Emergency Medicine

## 2017-02-18 ENCOUNTER — Encounter: Payer: Self-pay | Admitting: Emergency Medicine

## 2017-02-18 DIAGNOSIS — F17210 Nicotine dependence, cigarettes, uncomplicated: Secondary | ICD-10-CM | POA: Insufficient documentation

## 2017-02-18 DIAGNOSIS — X503XXA Overexertion from repetitive movements, initial encounter: Secondary | ICD-10-CM | POA: Insufficient documentation

## 2017-02-18 DIAGNOSIS — M79641 Pain in right hand: Secondary | ICD-10-CM | POA: Insufficient documentation

## 2017-02-18 DIAGNOSIS — Z79899 Other long term (current) drug therapy: Secondary | ICD-10-CM | POA: Insufficient documentation

## 2017-02-18 DIAGNOSIS — T148XXA Other injury of unspecified body region, initial encounter: Secondary | ICD-10-CM

## 2017-02-18 DIAGNOSIS — I10 Essential (primary) hypertension: Secondary | ICD-10-CM | POA: Insufficient documentation

## 2017-02-18 DIAGNOSIS — M79642 Pain in left hand: Secondary | ICD-10-CM | POA: Insufficient documentation

## 2017-02-18 DIAGNOSIS — I160 Hypertensive urgency: Secondary | ICD-10-CM | POA: Insufficient documentation

## 2017-02-18 MED ORDER — AMLODIPINE BESYLATE 5 MG PO TABS
5.0000 mg | ORAL_TABLET | Freq: Once | ORAL | Status: AC
  Administered 2017-02-18: 5 mg via ORAL
  Filled 2017-02-18: qty 1

## 2017-02-18 MED ORDER — AMLODIPINE BESYLATE 5 MG PO TABS: 5.0000 mg | ORAL_TABLET | Freq: Every day | ORAL | 2 refills | Status: DC

## 2017-02-18 MED ORDER — PREDNISONE 20 MG PO TABS
60.0000 mg | ORAL_TABLET | Freq: Once | ORAL | Status: AC
  Administered 2017-02-18: 60 mg via ORAL
  Filled 2017-02-18: qty 3

## 2017-02-18 MED ORDER — MELOXICAM 7.5 MG PO TABS: 7.5000 mg | ORAL_TABLET | Freq: Every day | ORAL | 0 refills | Status: AC

## 2017-02-18 MED ORDER — PREDNISONE 10 MG (21) PO TBPK: ORAL_TABLET | ORAL | 0 refills | Status: DC

## 2017-02-18 NOTE — Discharge Instructions (Signed)
Your exam is essentially normal, but your symptoms may represent some mild carpal tunnel or repetitive motion strain to the hands. You should wear the wrist splints previously prescribed. Take the steroid as directed. You should follow-up with Dr. Jerilee Hoh for continued symptoms.   Your blood pressure is not currently controlled. You should start the blood pressure medicine as directed. Follow-up with a local community clinic for ongoing blood pressure management. See the [place] Medication Assistance Program for help with your prescription.

## 2017-02-18 NOTE — ED Notes (Signed)
Patient job is grabbing and folding and repetitive motions. Patient has pain with making a fist.

## 2017-02-18 NOTE — ED Notes (Signed)
Patient to ED for complaints of bilateral wrist pain. Works on a [employer] and the more he worked "I couldn't take it anymore". Same repetative motion everyday

## 2017-02-18 NOTE — ED Triage Notes (Signed)
Patient ambulatory to triage without difficulty. States he started new job 4 days ago and developed pain to both wrists and hands yesterday. 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 States when working or using hands, pain worsens.

## 2017-02-18 NOTE — ED Notes (Signed)
Patient verbalizes understanding of d/c instructions and follow-up. VS stable and pain controlled per patient.  Patient in NAD at time of d/c and denies further concerns regarding this visit. Patient stable at the time of departure from the unit, departing unit by the safest and most appropriate manner per that patients condition and limitations. Patient advised to return to the ED at any time for emergent concerns, or for new/worsening symptoms.   

## 2017-02-19 NOTE — ED Provider Notes (Signed)
[facility] Emergency Department Provider Note ____________________________________________  Time seen: 2054  I have reviewed the triage vital signs and the nursing notes.  HISTORY  Chief Complaint  Hand Pain  HPI [name] is a 61 y.o. male presents to the ED for evaluation of bilateral hand pain. He has a history of clinically-diagnosed carpal tunnel. He was previously placed in cock-up splints, and reported resolution of symptoms. He has apparently started another (new) job in the last week. He reports similar hand pain and cramping. He denies injury, accident, or interim trauma. He has not return to using his splints and has not dosed any medicine for symptom relief.   Past Medical History:  Diagnosis Date  . Acid reflux   . Alcohol abuse   . Arthritis    "left pinky" (10/17/2014)  . DDD (degenerative disc disease), lumbar   . Gastroenteritis   . History of stress test 2001   no ischemia  . Hypertension   . Tobacco abuse   . Vertebral compression fracture (HCC)    T11, L1    Patient Active Problem List   Diagnosis Date Noted  . Bilateral recurrent inguinal hernia without obstruction or gangrene   . Influenza 10/18/2014  . Hypoxia 10/16/2014  . SOB (shortness of breath) 10/16/2014  . Cough 10/16/2014  . Fever 10/16/2014  . Sepsis ([place]) 10/16/2014  . Nausea and vomiting 10/16/2014  . Acid reflux   . DDD (degenerative disc disease), lumbar   . Hypertension   . Tobacco abuse   . Alcohol abuse   . Essential hypertension     Past Surgical History:  Procedure Laterality Date  . COLON SURGERY    . EXPLORATORY LAPAROTOMY W/ BOWEL RESECTION  1980's    Prior to Admission medications   Medication Sig Start Date End Date Taking? Authorizing Provider  amLODipine (NORVASC) 5 MG tablet Take 1 tablet (5 mg total) by mouth daily. 02/18/17 05/19/17  Shell Blanchette, Dannielle Karvonen, [name]  cyclobenzaprine (FLEXERIL) 5 MG tablet Take 1-2 tablets  (5-10 mg total) by mouth 3 (three) times daily as needed for muscle spasms. 09/04/16   [name], [name]  HYDROcodone-acetaminophen (NORCO) 5-325 MG tablet Take 1 tablet by mouth every 6 (six) hours as needed for moderate pain. 09/04/16   [name], [name]  HYDROcodone-acetaminophen (NORCO) 5-325 MG tablet Take 1 tablet by mouth every 4 (four) hours as needed for moderate pain. 11/29/16   [name], MD  meloxicam (MOBIC) 7.5 MG tablet Take 1 tablet (7.5 mg total) by mouth daily. 02/18/17 03/20/17  Carine Nordgren, Dannielle Karvonen, [name]  omeprazole (PRILOSEC) 20 MG capsule Take 20 mg by mouth daily.    [provider]  ondansetron (ZOFRAN) 4 MG tablet Take 1 tablet (4 mg total) by mouth every 8 (eight) hours as needed for nausea or vomiting. 03/05/16   [name], MD  predniSONE (STERAPRED UNI-PAK 21 TAB) 10 MG (21) TBPK tablet 6-day taper as directed. 02/18/17   Vernette Moise, Dannielle Karvonen, [name]  tetrahydrozoline-zinc (VISINE-AC) 0.05-0.25 % ophthalmic solution Place 1 drop into both eyes 2 (two) times daily as needed (for allergies.).    [provider]    Allergies Penicillins and Morphine and related  Family History  Problem Relation Age of Onset  . Diabetes Mother   . Hypertension Mother   . Stroke Father   . Heart failure Father   . Heart failure Other     Social History Social History  Substance  Use Topics  . Smoking status: Current Every Day Smoker    Packs/day: 0.50    Years: 27.00    Types: Cigarettes  . Smokeless tobacco: Never Used  . Alcohol use 38.4 oz/week    15 Cans of beer, 44 Shots of liquor, 5 Standard drinks or equivalent per week     Comment: 4/11/23016 "I drink none to 1 pint of gin/day; 40oz beer when I'm drinking; occasional wine; probably 4 pints/wk; 4, 40oz beers/wk"    Review of Systems  Constitutional: Negative for fever. Cardiovascular: Negative for chest pain. Respiratory: Negative for shortness of breath. Musculoskeletal:  Negative for back pain. Bilateral hand pain as above.  Skin: Negative for rash. Neurological: Negative for headaches, focal weakness or numbness. ____________________________________________  PHYSICAL EXAM:  VITAL SIGNS: ED Triage Vitals  Enc Vitals Group     BP 02/18/17 2009 (!) 164/103     Pulse Rate 02/18/17 2009 (!) 106     Resp 02/18/17 2009 20     Temp 02/18/17 2009 98.2 F (36.8 C)     Temp src --      SpO2 02/18/17 2009 100 %     Weight 02/18/17 2010 221 lb (100.2 kg)     Height 02/18/17 2010 5\' 11"  (1.803 m)     Head Circumference --      Peak Flow --      Pain Score 02/18/17 2008 8     Pain Loc --      Pain Edu? --      Excl. in [place]? --     Constitutional: Alert and oriented. Well appearing and in no distress. Head: Normocephalic and atraumatic. Neck: Supple. No thyromegaly. Cardiovascular: Normal rate, regular rhythm. Normal distal pulses. Respiratory: Normal respiratory effort. No wheezes/rales/rhonchi. Musculoskeletal: hands without edema, swelling, warmth, or erythema. Normal composite fists.  Nontender with normal range of motion in all extremities.  Neurologic:  Normal gross sensation. Normal intrinsic and opposition testing. Negative carpal and cubital Tinel's. Normal UE DTRs bilaterally.  Normal speech and language. No gross focal neurologic deficits are appreciated. Skin:  Skin is warm, dry and intact. No rash noted. ____________________________________________  PROCEDURES  Prednisone 60 mg PO ____________________________________________  INITIAL IMPRESSION / ASSESSMENT AND PLAN / ED COURSE  Patient with ED evaluation of bilateral hand pain and stiffness, worse since starting a new job, 4 days prior. He is likely with repetitive motion injury. He is advised to wear his wrist splints. He will be discharged with a prescription for prednisone to dose acutely. His is also started on meloxicam for chronic management. He is referred to Dr. Jerilee Hoh for further  diagnosis & ongoing management. His blood pressure is elevated and not currently controlled. He is started on amlodipine. He should monitor blood pressure and follow-up [facility] for management.  ____________________________________________  FINAL CLINICAL IMPRESSION(S) / ED DIAGNOSES  Final diagnoses:  Right hand pain  Left hand pain  Uncontrolled hypertension  Repetitive motion injury      Carmie End Dannielle Karvonen, [name] 02/19/17 0033    Nance Pear, MD 02/19/17 1534

## 2017-03-14 ENCOUNTER — Emergency Department: Payer: Self-pay

## 2017-03-14 ENCOUNTER — Encounter: Payer: Self-pay | Admitting: Emergency Medicine

## 2017-03-14 ENCOUNTER — Emergency Department
Admission: EM | Admit: 2017-03-14 | Discharge: 2017-03-15 | Disposition: A | Discharge status: Home / Self Care | Payer: Self-pay | Attending: Emergency Medicine | Admitting: Emergency Medicine

## 2017-03-14 DIAGNOSIS — I10 Essential (primary) hypertension: Secondary | ICD-10-CM | POA: Insufficient documentation

## 2017-03-14 DIAGNOSIS — F17210 Nicotine dependence, cigarettes, uncomplicated: Secondary | ICD-10-CM | POA: Insufficient documentation

## 2017-03-14 DIAGNOSIS — R197 Diarrhea, unspecified: Secondary | ICD-10-CM | POA: Insufficient documentation

## 2017-03-14 DIAGNOSIS — R000 Tachycardia, unspecified: Secondary | ICD-10-CM | POA: Insufficient documentation

## 2017-03-14 DIAGNOSIS — Z79899 Other long term (current) drug therapy: Secondary | ICD-10-CM | POA: Insufficient documentation

## 2017-03-14 DIAGNOSIS — R509 Fever, unspecified: Secondary | ICD-10-CM | POA: Insufficient documentation

## 2017-03-14 LAB — CBC WITH DIFFERENTIAL/PLATELET
Basophils Absolute: 0.1 10*3/uL (ref 0–0.1)
Basophils Relative: 1 %
Eosinophils Absolute: 0 10*3/uL (ref 0–0.7)
Eosinophils Relative: 0 %
HCT: 42.8 % (ref 40.0–52.0)
Hemoglobin: 14.8 g/dL (ref 13.0–18.0)
Lymphocytes Relative: 8 %
Lymphs Abs: 0.8 10*3/uL — ABNORMAL LOW (ref 1.0–3.6)
MCH: 30.3 pg (ref 26.0–34.0)
MCHC: 34.6 g/dL (ref 32.0–36.0)
MCV: 87.8 fL (ref 80.0–100.0)
Monocytes Absolute: 1 10*3/uL (ref 0.2–1.0)
Monocytes Relative: 10 %
Neutro Abs: 7.6 10*3/uL — ABNORMAL HIGH (ref 1.4–6.5)
Neutrophils Relative %: 81 %
Platelets: 134 10*3/uL — ABNORMAL LOW (ref 150–440)
RBC: 4.88 MIL/uL (ref 4.40–5.90)
RDW: 15.4 % — ABNORMAL HIGH (ref 11.5–14.5)
WBC: 9.5 10*3/uL (ref 3.8–10.6)

## 2017-03-14 LAB — URINALYSIS, COMPLETE (UACMP) WITH MICROSCOPIC
Bacteria, UA: NONE SEEN
Bilirubin Urine: NEGATIVE
Glucose, UA: NEGATIVE mg/dL
Ketones, ur: 5 mg/dL — AB
Leukocytes, UA: NEGATIVE
Nitrite: NEGATIVE
Protein, ur: NEGATIVE mg/dL
Specific Gravity, Urine: 1.012 (ref 1.005–1.030)
pH: 5 (ref 5.0–8.0)

## 2017-03-14 LAB — COMPREHENSIVE METABOLIC PANEL WITH GFR
ALT: 22 U/L (ref 17–63)
AST: 28 U/L (ref 15–41)
Albumin: 4.4 g/dL (ref 3.5–5.0)
Alkaline Phosphatase: 50 U/L (ref 38–126)
Anion gap: 13 (ref 5–15)
BUN: 7 mg/dL (ref 6–20)
CO2: 20 mmol/L — ABNORMAL LOW (ref 22–32)
Calcium: 8.5 mg/dL — ABNORMAL LOW (ref 8.9–10.3)
Chloride: 101 mmol/L (ref 101–111)
Creatinine, Ser: 0.95 mg/dL (ref 0.61–1.24)
GFR calc Af Amer: >60 mL/min
GFR calc non Af Amer: >60 mL/min
Glucose, Bld: 92 mg/dL (ref 65–99)
Potassium: 3.5 mmol/L (ref 3.5–5.1)
Sodium: 134 mmol/L — ABNORMAL LOW (ref 135–145)
Total Bilirubin: 0.9 mg/dL (ref 0.3–1.2)
Total Protein: 7.4 g/dL (ref 6.5–8.1)

## 2017-03-14 LAB — PROTIME-INR
INR: 0.95
PROTHROMBIN TIME: 12.6 s (ref 11.4–15.2)

## 2017-03-14 LAB — LACTIC ACID, PLASMA: Lactic Acid, Venous: 0.8 mmol/L (ref 0.5–1.9)

## 2017-03-14 MED ORDER — SODIUM CHLORIDE 0.9 % IV BOLUS (SEPSIS)
1000.0000 mL | Freq: Once | INTRAVENOUS | Status: AC
  Administered 2017-03-14: 1000 mL via INTRAVENOUS

## 2017-03-14 MED ORDER — ACETAMINOPHEN 500 MG PO TABS
1000.0000 mg | ORAL_TABLET | Freq: Once | ORAL | Status: AC
  Administered 2017-03-14: 1000 mg via ORAL
  Filled 2017-03-14: qty 2

## 2017-03-14 MED ORDER — KETOROLAC TROMETHAMINE 30 MG/ML IJ SOLN
15.0000 mg | Freq: Once | INTRAMUSCULAR | Status: AC
  Administered 2017-03-14: 15 mg via INTRAVENOUS
  Filled 2017-03-14: qty 1

## 2017-03-14 MED ORDER — ONDANSETRON 4 MG PO TBDP: 4.0000 mg | ORAL_TABLET | Freq: Three times a day (TID) | ORAL | 0 refills | Status: DC | PRN

## 2017-03-14 MED ORDER — ONDANSETRON HCL 4 MG/2ML IJ SOLN
4.0000 mg | Freq: Once | INTRAMUSCULAR | Status: AC
  Administered 2017-03-14: 4 mg via INTRAVENOUS
  Filled 2017-03-14: qty 2

## 2017-03-14 NOTE — ED Triage Notes (Signed)
Pt to triage via Delvin Hedeen, report generalized body aches, diarrhea, and nausea x 6 hours, pt febrile and tachycardic in triage

## 2017-03-14 NOTE — Discharge Instructions (Signed)
drink plenty of fluids. Take Tylenol 1000 mg every 8 hours for body aches and fever. Follow-up with your primary care doctor in 2 days. Return to the emergency room if you are feeling dehydrated, if you have several episodes of vomiting, if your diarrhea resumes or if you develop any other symptoms/

## 2017-03-14 NOTE — ED Provider Notes (Signed)
[facility] Emergency Department Provider Note  ____________________________________________  Time seen: Approximately 9:34 PM  I have reviewed the triage vital signs and the nursing notes.   HISTORY  Chief Complaint Generalized Body Aches   HPI [name] is a 61 y.o. male the history of hypertension, gastroenteritis who presents for evaluation of generalized body aches, diarrhea, fever. Patient reports that his symptoms started today at 3 PM. He has had 9-10 episodes of watery diarrhea with no melena or blood. Is complaining of generalized and severe body aches. Has had chills and fever. Has had severe nausea but no vomiting. No cough, congestion, chest pain, shortness of breath, abdominal pain. Patient reports that yesterday he found a turtle that he picked up and put it back in the water. He forgot to wash his hands and was rubbing his face and eating with his hands so he is afraid that he might have caught [place]mon[place].no history of C. difficile or recent antibiotic use.  Past Medical History:  Diagnosis Date  . Acid reflux   . Alcohol abuse   . Arthritis    "left pinky" (10/17/2014)  . DDD (degenerative disc disease), lumbar   . Gastroenteritis   . History of stress test 2001   no ischemia  . Hypertension   . Tobacco abuse   . Vertebral compression fracture (HCC)    T11, L1    Patient Active Problem List   Diagnosis Date Noted  . Bilateral recurrent inguinal hernia without obstruction or gangrene   . Influenza 10/18/2014  . Hypoxia 10/16/2014  . SOB (shortness of breath) 10/16/2014  . Cough 10/16/2014  . Fever 10/16/2014  . Sepsis ([place]) 10/16/2014  . Nausea and vomiting 10/16/2014  . Acid reflux   . DDD (degenerative disc disease), lumbar   . Hypertension   . Tobacco abuse   . Alcohol abuse   . Essential hypertension     Past Surgical History:  Procedure Laterality Date  . COLON SURGERY    . EXPLORATORY LAPAROTOMY W/  BOWEL RESECTION  1980's    Prior to Admission medications   Medication Sig Start Date End Date Taking? Authorizing Provider  amLODipine (NORVASC) 5 MG tablet Take 1 tablet (5 mg total) by mouth daily. 02/18/17 05/19/17 Yes Menshew, Dannielle Karvonen, [name]  cyclobenzaprine (FLEXERIL) 5 MG tablet Take 1-2 tablets (5-10 mg total) by mouth 3 (three) times daily as needed for muscle spasms. 09/04/16  Yes [name], [name]  meloxicam (MOBIC) 7.5 MG tablet Take 1 tablet (7.5 mg total) by mouth daily. 02/18/17 03/20/17 Yes Menshew, Dannielle Karvonen, [name]  omeprazole (PRILOSEC) 20 MG capsule Take 20 mg by mouth daily.   Yes [provider]  HYDROcodone-acetaminophen (NORCO) 5-325 MG tablet Take 1 tablet by mouth every 6 (six) hours as needed for moderate pain. Patient not taking: Reported on 03/14/2017 09/04/16   [name], [name]  HYDROcodone-acetaminophen [facility]) 5-325 MG tablet Take 1 tablet by mouth every 4 (four) hours as needed for moderate pain. Patient not taking: Reported on 03/14/2017 11/29/16   [name], MD  ondansetron (ZOFRAN ODT) 4 MG disintegrating tablet Take 1 tablet (4 mg total) by mouth every 8 (eight) hours as needed for nausea or vomiting. 03/14/17   Rudene Re, MD  ondansetron (ZOFRAN) 4 MG tablet Take 1 tablet (4 mg total) by mouth every 8 (eight) hours as needed for nausea or vomiting. Patient not taking: Reported on 03/14/2017 03/05/16   [name], MD  predniSONE (STERAPRED UNI-PAK 21 TAB) 10 MG (21) TBPK tablet 6-day taper as directed. Patient not taking: Reported on 03/14/2017 02/18/17   Menshew, Dannielle Karvonen, [name]    Allergies Penicillins and Morphine and related  Family History  Problem Relation Age of Onset  . Diabetes Mother   . Hypertension Mother   . Stroke Father   . Heart failure Father   . Heart failure Other     Social History Social History  Substance Use Topics  . Smoking status: Current Every Day Smoker    Packs/day: 0.50     Years: 27.00    Types: Cigarettes  . Smokeless tobacco: Never Used  . Alcohol use 38.4 oz/week    15 Cans of beer, 44 Shots of liquor, 5 Standard drinks or equivalent per week     Comment: 4/11/23016 "I drink none to 1 pint of gin/day; 40oz beer when I'm drinking; occasional wine; probably 4 pints/wk; 4, 40oz beers/wk"    Review of Systems  Constitutional: + fever, chills, body aches Eyes: Negative for visual changes. ENT: Negative for sore throat. Neck: No neck pain  Cardiovascular: Negative for chest pain. Respiratory: Negative for shortness of breath. Gastrointestinal: Negative for abdominal pain, vomiting. + nausea, and diarrhea. Genitourinary: Negative for dysuria. Musculoskeletal: Negative for back pain. Skin: Negative for rash. Neurological: Negative for headaches, weakness or numbness. Psych: No SI or HI  ____________________________________________   PHYSICAL EXAM:  VITAL SIGNS: ED Triage Vitals [03/14/17 2017]  Enc Vitals Group     BP 139/76     Pulse Rate (!) 127     Resp (!) 24     Temp (!) 100.4 F (38 C)     Temp Source Oral     SpO2 99 %     Weight 217 lb (98.4 kg)     Height 5\' 11"  (1.803 m)     Head Circumference      Peak Flow      Pain Score 10     Pain Loc      Pain Edu?      Excl. in [place]?     Constitutional: Alert and oriented. Well appearing and in no apparent distress. HEENT:      Head: Normocephalic and atraumatic.         Eyes: Conjunctivae are normal. Sclera is non-icteric.       Mouth/Throat: Mucous membranes are moist.       Neck: Supple with no signs of meningismus. Cardiovascular: tachycardic with regular rhythm. No murmurs, gallops, or rubs. 2+ symmetrical distal pulses are present in all extremities. No JVD. Respiratory: Normal respiratory effort. Lungs are clear to auscultation bilaterally. No wheezes, crackles, or rhonchi.  Gastrointestinal: Soft, non tender, and non distended with positive bowel sounds. No rebound or  guarding. Genitourinary: No CVA tenderness. Musculoskeletal: Nontender with normal range of motion in all extremities. No edema, cyanosis, or erythema of extremities. Neurologic: Normal speech and language. Face is symmetric. Moving all extremities. No gross focal neurologic deficits are appreciated. Skin: Skin is warm, dry and intact. No rash noted. Psychiatric: Mood and affect are normal. Speech and behavior are normal.  ____________________________________________   LABS (all labs ordered are listed, but only abnormal results are displayed)  Labs Reviewed  COMPREHENSIVE METABOLIC PANEL - Abnormal; Notable for the following:       Result Value   Sodium 134 (*)    CO2 20 (*)    Calcium 8.5 (*)    All other components within  normal limits  CBC WITH DIFFERENTIAL/PLATELET - Abnormal; Notable for the following:    RDW 15.4 (*)    Platelets 134 (*)    Neutro Abs 7.6 (*)    Lymphs Abs 0.8 (*)    All other components within normal limits  URINALYSIS, COMPLETE (UACMP) WITH MICROSCOPIC - Abnormal; Notable for the following:    Color, Urine YELLOW (*)    APPearance CLEAR (*)    Hgb urine dipstick SMALL (*)    Ketones, ur 5 (*)    Squamous Epithelial / LPF 0-5 (*)    All other components within normal limits  CULTURE, BLOOD (ROUTINE X 2)  CULTURE, BLOOD (ROUTINE X 2)  C DIFFICILE QUICK SCREEN W PCR REFLEX  GASTROINTESTINAL PANEL BY PCR, STOOL (REPLACES STOOL CULTURE)  LACTIC ACID, PLASMA  PROTIME-INR  LACTIC ACID, PLASMA   ____________________________________________  EKG  none  ____________________________________________  RADIOLOGY  CXR: negative  ____________________________________________   PROCEDURES  Procedure(s) performed: None Procedures Critical Care performed:  None ____________________________________________   INITIAL IMPRESSION / ASSESSMENT AND PLAN / ED COURSE  61 y.o. male the history of hypertension, gastroenteritis who presents for evaluation  of generalized body aches, diarrhea, fever. patient meets sepsis criteria upon arrival with a fever of 100.86F, tachycardic with heart rate of 127, normotensive. Patient has no tenderness on abdominal exam. We'll send stool for C. difficile and culture, we'll give IV fluids and Tylenol. Will check labs and lactate. Will get blood cultures. Will hold off on abx at this time in case this is viral.  Clinical Course as of Mar 14 2353  Fri Mar 14, 2017  2349 patient feels markedly improved. Has not had one bowel movement in the emergency department. Has been monitored for 4 hours. Patient received fluids with resolution of his tachycardia. Labs WNL. Feel that patient is safe for discharge at this time. Recommended increase by mouth hydration, Zofran as needed for nausea. Discussed return precautions.  [CV]    Clinical Course User Index [CV] Rudene Re, MD    Pertinent labs & imaging results that were available during my care of the patient were reviewed by me and considered in my medical decision making (see chart for details).    ____________________________________________   FINAL CLINICAL IMPRESSION(S) / ED DIAGNOSES  Final diagnoses:  Fever, unspecified fever cause  Diarrhea of presumed infectious origin  Tachycardia      NEW MEDICATIONS STARTED DURING THIS VISIT:  New Prescriptions   ONDANSETRON (ZOFRAN ODT) 4 MG DISINTEGRATING TABLET    Take 1 tablet (4 mg total) by mouth every 8 (eight) hours as needed for nausea or vomiting.     Note:  This document was prepared using Dragon voice recognition software and may include unintentional dictation errors.    Alfred Levins, [place], MD 03/14/17 [phone]

## 2017-03-14 NOTE — ED Notes (Signed)
Reviewed d/c instructions, follow-up care, prescriptions with patient. Pt verbalized understanding.  

## 2017-03-19 LAB — CULTURE, BLOOD (ROUTINE X 2)
Culture: NO GROWTH
Culture: NO GROWTH

## 2017-05-02 DIAGNOSIS — R110 Nausea: Secondary | ICD-10-CM | POA: Insufficient documentation

## 2017-05-02 DIAGNOSIS — I10 Essential (primary) hypertension: Secondary | ICD-10-CM | POA: Insufficient documentation

## 2017-05-02 DIAGNOSIS — R1030 Lower abdominal pain, unspecified: Secondary | ICD-10-CM | POA: Insufficient documentation

## 2017-05-02 DIAGNOSIS — R197 Diarrhea, unspecified: Secondary | ICD-10-CM | POA: Insufficient documentation

## 2017-05-02 DIAGNOSIS — Z79899 Other long term (current) drug therapy: Secondary | ICD-10-CM | POA: Insufficient documentation

## 2017-05-02 DIAGNOSIS — F17210 Nicotine dependence, cigarettes, uncomplicated: Secondary | ICD-10-CM | POA: Insufficient documentation

## 2017-05-03 ENCOUNTER — Emergency Department: Payer: Self-pay

## 2017-05-03 ENCOUNTER — Encounter: Payer: Self-pay | Admitting: Emergency Medicine

## 2017-05-03 ENCOUNTER — Emergency Department
Admission: EM | Admit: 2017-05-03 | Discharge: 2017-05-03 | Disposition: A | Discharge status: Home / Self Care | Payer: Self-pay | Attending: Emergency Medicine | Admitting: Emergency Medicine

## 2017-05-03 DIAGNOSIS — R110 Nausea: Secondary | ICD-10-CM

## 2017-05-03 DIAGNOSIS — R197 Diarrhea, unspecified: Secondary | ICD-10-CM

## 2017-05-03 DIAGNOSIS — R1030 Lower abdominal pain, unspecified: Secondary | ICD-10-CM

## 2017-05-03 LAB — URINALYSIS, COMPLETE (UACMP) WITH MICROSCOPIC
Bacteria, UA: NONE SEEN
Bilirubin Urine: NEGATIVE
Glucose, UA: NEGATIVE mg/dL
Hgb urine dipstick: NEGATIVE
KETONES UR: NEGATIVE mg/dL
Leukocytes, UA: NEGATIVE
Nitrite: NEGATIVE
PH: 6 (ref 5.0–8.0)
PROTEIN: NEGATIVE mg/dL
SQUAMOUS EPITHELIAL / LPF: NONE SEEN
Specific Gravity, Urine: 1.019 (ref 1.005–1.030)

## 2017-05-03 LAB — COMPREHENSIVE METABOLIC PANEL
ALBUMIN: 4 g/dL (ref 3.5–5.0)
ALT: 21 U/L (ref 17–63)
AST: 27 U/L (ref 15–41)
Alkaline Phosphatase: 51 U/L (ref 38–126)
Anion gap: 13 (ref 5–15)
BUN: 10 mg/dL (ref 6–20)
CHLORIDE: 105 mmol/L (ref 101–111)
CO2: 22 mmol/L (ref 22–32)
CREATININE: 0.85 mg/dL (ref 0.61–1.24)
Calcium: 8.7 mg/dL — ABNORMAL LOW (ref 8.9–10.3)
GFR calc non Af Amer: >60 mL/min (ref >60)
Glucose, Bld: 108 mg/dL — ABNORMAL HIGH (ref 65–99)
Potassium: 3.2 mmol/L — ABNORMAL LOW (ref 3.5–5.1)
SODIUM: 140 mmol/L (ref 135–145)
Total Bilirubin: 0.4 mg/dL (ref 0.3–1.2)
Total Protein: 7 g/dL (ref 6.5–8.1)

## 2017-05-03 LAB — GASTROINTESTINAL PANEL BY PCR, STOOL (REPLACES STOOL CULTURE)

## 2017-05-03 LAB — CBC
HCT: 41.5 % (ref 40.0–52.0)
Hemoglobin: 14.1 g/dL (ref 13.0–18.0)
MCH: 30.6 pg (ref 26.0–34.0)
MCHC: 34.1 g/dL (ref 32.0–36.0)
MCV: 89.8 fL (ref 80.0–100.0)
PLATELETS: 171 10*3/uL (ref 150–440)
RBC: 4.62 MIL/uL (ref 4.40–5.90)
RDW: 15.3 % — ABNORMAL HIGH (ref 11.5–14.5)
WBC: 3.9 10*3/uL (ref 3.8–10.6)

## 2017-05-03 LAB — C DIFFICILE QUICK SCREEN W PCR REFLEX
C DIFFICILE (CDIFF) INTERP: NOT DETECTED
C DIFFICLE (CDIFF) ANTIGEN: NEGATIVE
C Diff toxin: NEGATIVE

## 2017-05-03 LAB — LIPASE, BLOOD: LIPASE: 25 U/L (ref 11–51)

## 2017-05-03 MED ORDER — FENTANYL CITRATE (PF) 100 MCG/2ML IJ SOLN
50.0000 ug | Freq: Once | INTRAMUSCULAR | Status: AC
  Administered 2017-05-03: 50 ug via INTRAVENOUS
  Filled 2017-05-03: qty 2

## 2017-05-03 MED ORDER — ONDANSETRON HCL 4 MG/2ML IJ SOLN
4.0000 mg | Freq: Once | INTRAMUSCULAR | Status: AC
  Administered 2017-05-03: 4 mg via INTRAVENOUS
  Filled 2017-05-03: qty 2

## 2017-05-03 MED ORDER — IOPAMIDOL (ISOVUE-300) INJECTION 61%
100.0000 mL | Freq: Once | INTRAVENOUS | Status: AC | PRN
  Administered 2017-05-03: 100 mL via INTRAVENOUS

## 2017-05-03 MED ORDER — ONDANSETRON 4 MG PO TBDP: 4.0000 mg | ORAL_TABLET | Freq: Three times a day (TID) | ORAL | 0 refills | Status: DC | PRN

## 2017-05-03 MED ORDER — IOPAMIDOL (ISOVUE-300) INJECTION 61%
30.0000 mL | Freq: Once | INTRAVENOUS | Status: AC | PRN
  Administered 2017-05-03: 30 mL via ORAL

## 2017-05-03 MED ORDER — SODIUM CHLORIDE 0.9 % IV BOLUS (SEPSIS)
1000.0000 mL | Freq: Once | INTRAVENOUS | Status: AC
  Administered 2017-05-03: 1000 mL via INTRAVENOUS

## 2017-05-03 MED ORDER — DICYCLOMINE HCL 20 MG PO TABS
20.0000 mg | ORAL_TABLET | Freq: Four times a day (QID) | ORAL | 0 refills | Status: DC | PRN
Start: 2017-05-03 — End: 2017-08-22

## 2017-05-03 NOTE — ED Triage Notes (Signed)
Pt reports that he has had lower abdominal pain since around 1500 today without relief. Pt is ambulatory to triage with NAD.

## 2017-05-03 NOTE — ED Provider Notes (Signed)
[facility] Emergency Department Provider Note   ____________________________________________   First MD Initiated Contact with Patient 05/03/17 [phone]     (approximate)  I have reviewed the triage vital signs and the nursing notes.   HISTORY  Chief Complaint Abdominal Pain    HPI [name] is a 61 y.o. male who presents to the ED from home with a chief complaint of abdominal pain.  Patient reports a 1 day history of bilateral lower, sharp, nonradiating abdominal pain associated with nausea and green diarrhea.  Reports multiple episodes of loose stools.  Denies associated fever, chills, chest pain, shortness of breath, dysuria, testicular pain or swelling.  Denies recent travel, trauma or antibiotic use.   Past Medical History:  Diagnosis Date  . Acid reflux   . Alcohol abuse   . Arthritis    "left pinky" (10/17/2014)  . DDD (degenerative disc disease), lumbar   . Gastroenteritis   . History of stress test 2001   no ischemia  . Hypertension   . Tobacco abuse   . Vertebral compression fracture (HCC)    T11, L1    Patient Active Problem List   Diagnosis Date Noted  . Bilateral recurrent inguinal hernia without obstruction or gangrene   . Influenza 10/18/2014  . Hypoxia 10/16/2014  . SOB (shortness of breath) 10/16/2014  . Cough 10/16/2014  . Fever 10/16/2014  . Sepsis ([place]) 10/16/2014  . Nausea and vomiting 10/16/2014  . Acid reflux   . DDD (degenerative disc disease), lumbar   . Hypertension   . Tobacco abuse   . Alcohol abuse   . Essential hypertension     Past Surgical History:  Procedure Laterality Date  . COLON SURGERY    . EXPLORATORY LAPAROTOMY W/ BOWEL RESECTION  1980's    Prior to Admission medications   Medication Sig Start Date End Date Taking? Authorizing Provider  amLODipine (NORVASC) 5 MG tablet Take 1 tablet (5 mg total) by mouth daily. 02/18/17 05/19/17 Yes Menshew, Dannielle Karvonen, [name]  omeprazole  (PRILOSEC) 20 MG capsule Take 20 mg by mouth daily.   Yes [provider]  cyclobenzaprine (FLEXERIL) 5 MG tablet Take 1-2 tablets (5-10 mg total) by mouth 3 (three) times daily as needed for muscle spasms. Patient not taking: Reported on 05/03/2017 09/04/16   [name], [name]  HYDROcodone-acetaminophen [facility]) 5-325 MG tablet Take 1 tablet by mouth every 6 (six) hours as needed for moderate pain. Patient not taking: Reported on 03/14/2017 09/04/16   [name], [name]  HYDROcodone-acetaminophen [facility]) 5-325 MG tablet Take 1 tablet by mouth every 4 (four) hours as needed for moderate pain. Patient not taking: Reported on 03/14/2017 11/29/16   [name], MD  ondansetron (ZOFRAN ODT) 4 MG disintegrating tablet Take 1 tablet (4 mg total) by mouth every 8 (eight) hours as needed for nausea or vomiting. 03/14/17   Rudene Re, MD  ondansetron (ZOFRAN) 4 MG tablet Take 1 tablet (4 mg total) by mouth every 8 (eight) hours as needed for nausea or vomiting. Patient not taking: Reported on 03/14/2017 03/05/16   [name], MD  predniSONE (STERAPRED UNI-PAK 21 TAB) 10 MG (21) TBPK tablet 6-day taper as directed. Patient not taking: Reported on 03/14/2017 02/18/17   Menshew, Dannielle Karvonen, [name]    Allergies Penicillins and Morphine and related  Family History  Problem Relation Age of Onset  . Diabetes Mother   . Hypertension Mother   . Stroke Father   .  Heart failure Father   . Heart failure Other     Social History Social History  Substance Use Topics  . Smoking status: Current Every Day Smoker    Packs/day: 0.50    Years: 27.00    Types: Cigarettes  . Smokeless tobacco: Never Used  . Alcohol use 38.4 oz/week    15 Cans of beer, 44 Shots of liquor, 5 Standard drinks or equivalent per week     Comment: 4/11/23016 "I drink none to 1 pint of gin/day; 40oz beer when I'm drinking; occasional wine; probably 4 pints/wk; 4, 40oz beers/wk"    Review of  Systems  Constitutional: No fever/chills. Eyes: No visual changes. ENT: No sore throat. Cardiovascular: Denies chest pain. Respiratory: Denies shortness of breath. Gastrointestinal: Positive for abdominal pain and nausea, no vomiting.  Positive for diarrhea.  No constipation. Genitourinary: Negative for dysuria. Musculoskeletal: Negative for back pain. Skin: Negative for rash. Neurological: Negative for headaches, focal weakness or numbness.   ____________________________________________   PHYSICAL EXAM:  VITAL SIGNS: ED Triage Vitals  Enc Vitals Group     BP 05/03/17 0034 (!) 158/96     Pulse Rate 05/03/17 0034 (!) 114     Resp 05/03/17 0034 14     Temp 05/03/17 0034 98.8 F (37.1 C)     Temp Source 05/03/17 0034 Oral     SpO2 05/03/17 0034 93 %     Weight 05/03/17 0025 211 lb (95.7 kg)     Height 05/03/17 0025 5\' 11"  (1.803 m)     Head Circumference --      Peak Flow --      Pain Score 05/03/17 0024 6     Pain Loc --      Pain Edu? --      Excl. in [place]? --     Constitutional: Alert and oriented. Well appearing and in no acute distress. Eyes: Conjunctivae are normal. PERRL. EOMI. Head: Atraumatic. Nose: No congestion/rhinnorhea. Mouth/Throat: Mucous membranes are moist.  Oropharynx non-erythematous. Neck: No stridor.   Cardiovascular: Normal rate, regular rhythm. Grossly normal heart sounds.  Good peripheral circulation. Respiratory: Normal respiratory effort.  No retractions. Lungs CTAB. Gastrointestinal: Soft and mildly tender to palpation lower abdomen without rebound or guarding. No distention. No abdominal bruits. No CVA tenderness. Musculoskeletal: No lower extremity tenderness nor edema.  No joint effusions. Neurologic:  Normal speech and language. No gross focal neurologic deficits are appreciated. No gait instability. Skin:  Skin is warm, dry and intact. No rash noted. Psychiatric: Mood and affect are normal. Speech and behavior are  normal.  ____________________________________________   LABS (all labs ordered are listed, but only abnormal results are displayed)  Labs Reviewed  COMPREHENSIVE METABOLIC PANEL - Abnormal; Notable for the following:       Result Value   Potassium 3.2 (*)    Glucose, Bld 108 (*)    Calcium 8.7 (*)    All other components within normal limits  CBC - Abnormal; Notable for the following:    RDW 15.3 (*)    All other components within normal limits  URINALYSIS, COMPLETE (UACMP) WITH MICROSCOPIC - Abnormal; Notable for the following:    Color, Urine YELLOW (*)    APPearance CLEAR (*)    All other components within normal limits  GASTROINTESTINAL PANEL BY PCR, STOOL (REPLACES STOOL CULTURE)  C DIFFICILE QUICK SCREEN W PCR REFLEX  LIPASE, BLOOD   ____________________________________________  EKG  None ____________________________________________  RADIOLOGY  Ct Abdomen Pelvis W Contrast  Result Date: 05/03/2017 CLINICAL DATA:  Lower abdominal pain since this afternoon. History of bowel surgery, eosinophilic gastroenteritis, alcohol abuse, inguinal hernias. EXAM: CT ABDOMEN AND PELVIS WITH CONTRAST TECHNIQUE: Multidetector CT imaging of the abdomen and pelvis was performed using the standard protocol following bolus administration of intravenous contrast. CONTRAST:  115mL ISOVUE-300 IOPAMIDOL (ISOVUE-300) INJECTION 61% COMPARISON:  CT abdomen and pelvis February 24, 2016 and CT abdomen and pelvis May 14, 2013 FINDINGS: LOWER CHEST: Lung bases are clear. Included heart size is normal. No pericardial effusion. HEPATOBILIARY: 2 benign appearing cyst in dome of the liver measuring to 16 mm. Contracted gallbladder. PANCREAS: Normal. SPLEEN: Normal. ADRENALS/URINARY TRACT: Kidneys are orthotopic, demonstrating symmetric enhancement. No nephrolithiasis, hydronephrosis or solid renal masses. 4.1 cm benign-appearing cyst upper pole LEFT kidney. Too small to characterize hypodensity upper pole  RIGHT kidney. The unopacified ureters are normal in course and caliber. Delayed imaging through the kidneys demonstrates symmetric prompt contrast excretion within the proximal urinary collecting system. Urinary bladder is well distended, tented towards the RIGHT inguinal hernia. Mild focal thickening of the RIGHT bladder wall, improved from prior CT. Normal adrenal glands. STOMACH/BOWEL: The stomach, small and large bowel are normal in course and caliber without inflammatory changes. LEFT upper quadrant surgical small bowel anastomosis. Enteric contrast has not yet reached the distal small bowel. Mild colonic diverticulosis. Normal air-filled retrocecal appendix. VASCULAR/LYMPHATIC: Aortoiliac vessels are normal in course and caliber. Mild calcific atherosclerosis. No lymphadenopathy by CT size criteria. REPRODUCTIVE: Borderline prostatomegaly invading the base the bladder. OTHER: No intraperitoneal free fluid or free air. Moderate bilateral fat containing inguinal hernias. Small fat containing umbilical hernia . MUSCULOSKELETAL: Nonacute. Scarring anterior abdominal wall. Multiple old mild thoracolumbar compression fractures. IMPRESSION: 1. No acute intra-abdominal or pelvic process. 2. Moderate bilateral fat containing inguinal hernias. Unchanged bladder extension towards RIGHT inguinal hernia with chronic wall thickening. Aortic Atherosclerosis (ICD10-I70.0). Electronically Signed   By: Elon Alas M.D.   On: 05/03/2017 04:32    ____________________________________________   PROCEDURES  Procedure(s) performed: None  Procedures  Critical Care performed: No  ____________________________________________   INITIAL IMPRESSION / ASSESSMENT AND PLAN / ED COURSE  As part of my medical decision making, I reviewed the following data within the [place] notes reviewed and incorporated, Labs reviewed, Radiograph reviewed and Notes from prior ED visits.   61 year old  male who presents with lower abdominal pain, nausea and diarrhea.  History of bowel resection. Differential diagnosis includes, but is not limited to, acute appendicitis, renal colic, testicular torsion, urinary tract infection/pyelonephritis, prostatitis,  epididymitis, diverticulitis, small bowel obstruction or ileus, colitis, abdominal aortic aneurysm, gastroenteritis, hernia, etc. laboratory urinalysis results unremarkable.  Will collect stool specimen, administer IV analgesic and proceed with CT abdomen/pelvis to evaluate for intra-abdominal etiology of patient's pain.  Clinical Course as of May 03 650  Sat May 03, 2017  0508 Patient resting comfortably watching TV.  Updated him of CT results.  He was able to produce a stool specimen and we are awaiting results of C. difficile and Biofire panels.  Voices no complaints at this time.  [JS]  0620 Updated patient of negative C. difficile results.  Gave him the option of waiting for biofire panel to result or to be discharged home with follow-up telephone call if biofire is positive.  Patient opts to wait.  Resting in no acute distress.  [JS]  [id number] Updated patient of negative biofire results.  Will discharge home on Bentyl, Zofran and will follow up with his  PCP early next week.  Strict return precautions given.  Patient verbalizes understanding and agrees with plan of care.  [JS]    Clinical Course User Index [JS] Paulette Blanch, MD     ____________________________________________   FINAL CLINICAL IMPRESSION(S) / ED DIAGNOSES  Final diagnoses:  Lower abdominal pain  Diarrhea, unspecified type  Nausea      NEW MEDICATIONS STARTED DURING THIS VISIT:  New Prescriptions   No medications on file     Note:  This document was prepared using Dragon voice recognition software and may include unintentional dictation errors.    Paulette Blanch, MD 05/03/17 [phone]

## 2017-05-03 NOTE — ED Notes (Signed)
Pt finished with oral contrast; CT called to inform patient is ready for scan.

## 2017-05-03 NOTE — ED Notes (Signed)
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

## 2017-05-03 NOTE — Discharge Instructions (Signed)
1.  You may take medicines as needed for nausea and abdominal discomfort (Zofran/Bentyl #20). 2.  BRAT diet times 3 days, then slowly advance diet as tolerated. 3.  Return to the ER for worsening symptoms, persistent vomiting, difficulty breathing or other concerns.

## 2017-05-23 ENCOUNTER — Encounter: Payer: Self-pay | Admitting: Emergency Medicine

## 2017-05-23 ENCOUNTER — Other Ambulatory Visit: Payer: Self-pay

## 2017-05-23 ENCOUNTER — Emergency Department
Admission: EM | Admit: 2017-05-23 | Discharge: 2017-05-23 | Disposition: A | Discharge status: Home / Self Care | Payer: No Typology Code available for payment source | Attending: Emergency Medicine | Admitting: Emergency Medicine

## 2017-05-23 DIAGNOSIS — Y93K1 Activity, walking an animal: Secondary | ICD-10-CM | POA: Insufficient documentation

## 2017-05-23 DIAGNOSIS — Z79899 Other long term (current) drug therapy: Secondary | ICD-10-CM | POA: Insufficient documentation

## 2017-05-23 DIAGNOSIS — W010XXA Fall on same level from slipping, tripping and stumbling without subsequent striking against object, initial encounter: Secondary | ICD-10-CM | POA: Diagnosis not present

## 2017-05-23 DIAGNOSIS — I10 Essential (primary) hypertension: Secondary | ICD-10-CM | POA: Insufficient documentation

## 2017-05-23 DIAGNOSIS — F17210 Nicotine dependence, cigarettes, uncomplicated: Secondary | ICD-10-CM | POA: Diagnosis not present

## 2017-05-23 DIAGNOSIS — M62830 Muscle spasm of back: Secondary | ICD-10-CM

## 2017-05-23 DIAGNOSIS — Y999 Unspecified external cause status: Secondary | ICD-10-CM | POA: Insufficient documentation

## 2017-05-23 DIAGNOSIS — Y929 Unspecified place or not applicable: Secondary | ICD-10-CM | POA: Diagnosis not present

## 2017-05-23 DIAGNOSIS — S3992XA Unspecified injury of lower back, initial encounter: Secondary | ICD-10-CM | POA: Diagnosis present

## 2017-05-23 DIAGNOSIS — M5430 Sciatica, unspecified side: Secondary | ICD-10-CM

## 2017-05-23 DIAGNOSIS — S39012A Strain of muscle, fascia and tendon of lower back, initial encounter: Secondary | ICD-10-CM | POA: Diagnosis not present

## 2017-05-23 DIAGNOSIS — M5442 Lumbago with sciatica, left side: Secondary | ICD-10-CM | POA: Insufficient documentation

## 2017-05-23 DIAGNOSIS — W19XXXA Unspecified fall, initial encounter: Secondary | ICD-10-CM

## 2017-05-23 MED ORDER — DEXAMETHASONE SODIUM PHOSPHATE 10 MG/ML IJ SOLN
10.0000 mg | Freq: Once | INTRAMUSCULAR | Status: AC
  Administered 2017-05-23: 10 mg via INTRAMUSCULAR
  Filled 2017-05-23: qty 1

## 2017-05-23 MED ORDER — ORPHENADRINE CITRATE 30 MG/ML IJ SOLN
60.0000 mg | Freq: Once | INTRAMUSCULAR | Status: AC
  Administered 2017-05-23: 60 mg via INTRAMUSCULAR
  Filled 2017-05-23: qty 2

## 2017-05-23 MED ORDER — ORPHENADRINE CITRATE ER 100 MG PO TB12: 100.0000 mg | ORAL_TABLET | Freq: Two times a day (BID) | ORAL | 1 refills | Status: AC | PRN

## 2017-05-23 MED ORDER — PREDNISONE 10 MG (21) PO TBPK: ORAL_TABLET | ORAL | 0 refills | Status: DC

## 2017-05-23 NOTE — Discharge Instructions (Signed)
Take medication as prescribed.   Return to emergency department if symptoms worsen and follow-up with PCP as needed.    You may use cold/cold packs or heat therapy as needed for pain.

## 2017-05-23 NOTE — ED Triage Notes (Signed)
[place] this morning on to concrete porch while walking the dogs.  Arrives to ED now with lower back pain that radiates down left leg.

## 2017-05-23 NOTE — ED Provider Notes (Signed)
[facility] Emergency Department Provider Note   ____________________________________________   I have reviewed the triage vital signs and the nursing notes.   HISTORY  Chief Complaint Fall    HPI Edwin Brogen Duell is a 61 y.o. male presents emergency department with left-sided buttock pain and radiating pain down the left lower extremity to the foot since falling on a concrete porch earlier this morning.  Patient was walking his dog when he experienced the fall.  Patient reports landing directly on his buttocks not sustaining any other injuries.  Patient denies head or neck injury and denies loss of consciousness. Patient took 2 Aleve and attempted to go to work.  Patient began to experience radiating pain down the left lower extremity and was unable to attend work and came in for evaluation.  Patient denies any changes in bowel or bladder or saddle anesthesia since onset of symptoms.  Patient reports a past history of compression fractures although he is unsure of the levels. Patient denies fever, chills, headache, vision changes, chest pain, chest tightness, shortness of breath, abdominal pain, nausea and vomiting.  Past Medical History:  Diagnosis Date  . Acid reflux   . Alcohol abuse   . Arthritis    "left pinky" (10/17/2014)  . DDD (degenerative disc disease), lumbar   . Gastroenteritis   . History of stress test 2001   no ischemia  . Hypertension   . Tobacco abuse   . Vertebral compression fracture (HCC)    T11, L1    Patient Active Problem List   Diagnosis Date Noted  . Bilateral recurrent inguinal hernia without obstruction or gangrene   . Influenza 10/18/2014  . Hypoxia 10/16/2014  . SOB (shortness of breath) 10/16/2014  . Cough 10/16/2014  . Fever 10/16/2014  . Sepsis ([place]) 10/16/2014  . Nausea and vomiting 10/16/2014  . Acid reflux   . DDD (degenerative disc disease), lumbar   . Hypertension   . Tobacco abuse   . Alcohol  abuse   . Essential hypertension     Past Surgical History:  Procedure Laterality Date  . COLON SURGERY    . EXPLORATORY LAPAROTOMY W/ BOWEL RESECTION  1980's    Prior to Admission medications   Medication Sig Start Date End Date Taking? Authorizing Provider  amLODipine (NORVASC) 5 MG tablet Take 1 tablet (5 mg total) by mouth daily. 02/18/17 05/19/17  Menshew, Dannielle Karvonen, [name]  cyclobenzaprine (FLEXERIL) 5 MG tablet Take 1-2 tablets (5-10 mg total) by mouth 3 (three) times daily as needed for muscle spasms. Patient not taking: Reported on 05/03/2017 09/04/16   [name], [name]  dicyclomine (BENTYL) 20 MG tablet Take 1 tablet (20 mg total) by mouth every 6 (six) hours as needed. 05/03/17   Paulette Blanch, MD  HYDROcodone-acetaminophen (NORCO) 5-325 MG tablet Take 1 tablet by mouth every 6 (six) hours as needed for moderate pain. Patient not taking: Reported on 03/14/2017 09/04/16   [name], [name]  HYDROcodone-acetaminophen [facility]) 5-325 MG tablet Take 1 tablet by mouth every 4 (four) hours as needed for moderate pain. Patient not taking: Reported on 03/14/2017 11/29/16   [name], MD  omeprazole (PRILOSEC) 20 MG capsule Take 20 mg by mouth daily.    [provider]  ondansetron (ZOFRAN ODT) 4 MG disintegrating tablet Take 1 tablet (4 mg total) by mouth every 8 (eight) hours as needed for nausea or vomiting. 05/03/17   Paulette Blanch, MD  ondansetron (ZOFRAN) 4 MG  tablet Take 1 tablet (4 mg total) by mouth every 8 (eight) hours as needed for nausea or vomiting. Patient not taking: Reported on 03/14/2017 03/05/16   [name], MD  orphenadrine (NORFLEX) 100 MG tablet Take 1 tablet (100 mg total) 2 (two) times daily as needed for up to 14 days by mouth for muscle spasms. 05/23/17 06/06/17  Avril Busser M, [name]  predniSONE (STERAPRED UNI-PAK 21 TAB) 10 MG (21) TBPK tablet Take 6 tablets on day 1. Take 5 tablets on day 2. Take 4 tablets on day 3. Take 3 tablets on day  4. Take 2 tablets on day 5. Take 1 tablets on day 6. 05/23/17   Fergus Throne M, [name]    Allergies Penicillins and Morphine and related  Family History  Problem Relation Age of Onset  . Diabetes Mother   . Hypertension Mother   . Stroke Father   . Heart failure Father   . Heart failure Other     Social History Social History   Tobacco Use  . Smoking status: Current Every Day Smoker    Packs/day: 0.50    Years: 27.00    Pack years: 13.50    Types: Cigarettes  . Smokeless tobacco: Never Used  Substance Use Topics  . Alcohol use: Yes    Alcohol/week: 38.4 oz    Types: 15 Cans of beer, 44 Shots of liquor, 5 Standard drinks or equivalent per week    Comment: 4/11/23016 "I drink none to 1 pint of gin/day; 40oz beer when I'm drinking; occasional wine; probably 4 pints/wk; 4, 40oz beers/wk"  . Drug use: No    Comment: 10/17/2014 "last marijuana was in the 1990's"    Review of Systems Constitutional: Negative for fever/chills Cardiovascular: Denies chest pain. Respiratory: Denies shortness of breath. Musculoskeletal: Positive for lumbar back pain and left lower extremity radicular pain. Skin: Negative for rash. Neurological: Negative for headaches.  Negative focal weakness or numbness. Negative for loss of consciousness. Able to ambulate. ____________________________________________   PHYSICAL EXAM:  VITAL SIGNS: ED Triage Vitals  Enc Vitals Group     BP 05/23/17 1146 (!) 154/95     Pulse Rate 05/23/17 1146 (!) 102     Resp 05/23/17 1146 18     Temp 05/23/17 1146 98.8 F (37.1 C)     Temp Source 05/23/17 1146 Oral     SpO2 05/23/17 1146 98 %     Weight 05/23/17 1145 211 lb (95.7 kg)     Height 05/23/17 1145 5\' 11"  (1.803 m)     Head Circumference --      Peak Flow --      Pain Score 05/23/17 1144 9     Pain Loc --      Pain Edu? --      Excl. in [place]? --     Constitutional: Alert and oriented. Well appearing and in no acute distress.  Eyes: Conjunctivae are  normal. PERRL. Head: Normocephalic and atraumatic. Cardiovascular: Normal rate, regular rhythm  Good peripheral circulation. Respiratory: Normal respiratory effort without tachypnea or retractions.  Musculoskeletal: Lumbar spine range of motion all planes intact.  Negative lumbar spinous process tenderness, no deformities noted.  Palpable tenderness along the left paraspinal, glut medius and piriformis. Left lower extremity radiculopathy without sensation changes or significant motor weakness. Neurologic: Normal speech and language. No gross focal neurologic deficits are appreciated. No sensory loss or abnormal reflexes.  Skin:  Skin is warm, dry and intact. No rash noted. Psychiatric: Mood  and affect are normal. Speech and behavior are normal. Patient exhibits appropriate insight and judgement.  ____________________________________________   LABS (all labs ordered are listed, but only abnormal results are displayed)  Labs Reviewed - No data to display ____________________________________________  EKG None ____________________________________________  RADIOLOGY None ____________________________________________   PROCEDURES  Procedure(s) performed: no    Critical Care performed: no ____________________________________________   INITIAL IMPRESSION / ASSESSMENT AND PLAN / ED COURSE  Pertinent labs & imaging results that were available during my care of the patient were reviewed by me and considered in my medical decision making (see chart for details).  Patient presents to emergency department left-sided buttock pain and radiating pain down the left lower extremity to the foot since falling on a concrete porch earlier this morning.  History and physical exam findings are reassuring symptoms are consistent with lumbar back pain with left-sided sciatic symptoms and associated muscle spasms of the lumbar paraspinals, gluteal medius and piriformis. Patient noted improvement of  symptoms following Decadron, Norflex given during the course of care in the emergency department. Patient will be prescribed prednisone taper and Norflex as needed for muscle spasms. Patient advised to follow up with PCP as needed or return to the emergency department if symptoms return or worsen. Patient informed of clinical course, understand medical decision-making process, and agree with plan. ____________________________________________   FINAL CLINICAL IMPRESSION(S) / ED DIAGNOSES  Final diagnoses:  Fall, initial encounter  Strain of lumbar region, initial encounter  Spasm of muscle of lower back  Sciatic leg pain       NEW MEDICATIONS STARTED DURING THIS VISIT:  This SmartLink is deprecated. Use AVSMEDLIST instead to display the medication list for a patient.   Note:  This document was prepared using Dragon voice recognition software and may include unintentional dictation errors.    Jerolyn Shin, [name] 05/23/17 1708    [name], MD 05/24/17 [phone]

## 2017-06-13 ENCOUNTER — Encounter: Payer: Self-pay | Admitting: Emergency Medicine

## 2017-06-13 ENCOUNTER — Emergency Department
Admission: EM | Admit: 2017-06-13 | Discharge: 2017-06-13 | Disposition: A | Discharge status: Home / Self Care | Payer: PRIVATE HEALTH INSURANCE | Attending: Emergency Medicine | Admitting: Emergency Medicine

## 2017-06-13 DIAGNOSIS — H9312 Tinnitus, left ear: Secondary | ICD-10-CM

## 2017-06-13 DIAGNOSIS — F17210 Nicotine dependence, cigarettes, uncomplicated: Secondary | ICD-10-CM | POA: Insufficient documentation

## 2017-06-13 DIAGNOSIS — I10 Essential (primary) hypertension: Secondary | ICD-10-CM | POA: Insufficient documentation

## 2017-06-13 DIAGNOSIS — Z79899 Other long term (current) drug therapy: Secondary | ICD-10-CM | POA: Insufficient documentation

## 2017-06-13 DIAGNOSIS — J0110 Acute frontal sinusitis, unspecified: Secondary | ICD-10-CM | POA: Insufficient documentation

## 2017-06-13 DIAGNOSIS — R0981 Nasal congestion: Secondary | ICD-10-CM | POA: Diagnosis present

## 2017-06-13 MED ORDER — DOXYCYCLINE HYCLATE 50 MG PO CAPS: 100.0000 mg | ORAL_CAPSULE | Freq: Two times a day (BID) | ORAL | 0 refills | Status: AC

## 2017-06-13 MED ORDER — FLUTICASONE PROPIONATE 50 MCG/ACT NA SUSP: 2.0000 | Freq: Every day | NASAL | 0 refills | Status: DC

## 2017-06-13 NOTE — ED Triage Notes (Signed)
Patient is complaining of ringing in his left ear x 2 weeks, worse today.  Patient states, "I feel like it's throwing my balance off".  Patient is in no obvious distress at this time.

## 2017-06-13 NOTE — ED Provider Notes (Signed)
[facility] Emergency Department Provider Note  ____________________________________________  Time seen: Approximately 10:55 AM  I have reviewed the triage vital signs and the nursing notes.   HISTORY  Chief Complaint Tinnitus    HPI [name] is a 61 y.o. male that presents emergency department for evaluation of nasal congestion and tinnitus for 2 weeks.  Patient states that he is blowing yellow nasal drainage.  His left ear has been ringing for 2 weeks.  The ringing happens on and off.  It seems to be getting worse and it occasionally makes him feel off balance. Ear is not painful. He smokes a pack of cigarettes per day.  No allergies or asthma.  No alleviating measures have been attempted. He would like a work note. He denies fever, chills, cough, shortness of breath, chest pain, nausea, vomiting, abdominal pain.   Past Medical History:  Diagnosis Date  . Acid reflux   . Alcohol abuse   . Arthritis    "left pinky" (10/17/2014)  . DDD (degenerative disc disease), lumbar   . Gastroenteritis   . History of stress test 2001   no ischemia  . Hypertension   . Tobacco abuse   . Vertebral compression fracture (HCC)    T11, L1    Patient Active Problem List   Diagnosis Date Noted  . Bilateral recurrent inguinal hernia without obstruction or gangrene   . Influenza 10/18/2014  . Hypoxia 10/16/2014  . SOB (shortness of breath) 10/16/2014  . Cough 10/16/2014  . Fever 10/16/2014  . Sepsis ([place]) 10/16/2014  . Nausea and vomiting 10/16/2014  . Acid reflux   . DDD (degenerative disc disease), lumbar   . Hypertension   . Tobacco abuse   . Alcohol abuse   . Essential hypertension     Past Surgical History:  Procedure Laterality Date  . COLON SURGERY    . EXPLORATORY LAPAROTOMY W/ BOWEL RESECTION  1980's    Prior to Admission medications   Medication Sig Start Date End Date Taking? Authorizing Provider  amLODipine (NORVASC) 5 MG tablet  Take 1 tablet (5 mg total) by mouth daily. 02/18/17 05/19/17  Menshew, Dannielle Karvonen, [name]  cyclobenzaprine (FLEXERIL) 5 MG tablet Take 1-2 tablets (5-10 mg total) by mouth 3 (three) times daily as needed for muscle spasms. Patient not taking: Reported on 05/03/2017 09/04/16   [name], [name]  dicyclomine (BENTYL) 20 MG tablet Take 1 tablet (20 mg total) by mouth every 6 (six) hours as needed. 05/03/17   Paulette Blanch, MD  doxycycline (VIBRAMYCIN) 50 MG capsule Take 2 capsules (100 mg total) by mouth 2 (two) times daily for 10 days. 06/13/17 06/23/17  Laban Emperor, [name]  fluticasone (FLONASE) 50 MCG/ACT nasal spray Place 2 sprays into both nostrils daily. 06/13/17 06/13/18  Laban Emperor, [name]  HYDROcodone-acetaminophen (NORCO) 5-325 MG tablet Take 1 tablet by mouth every 6 (six) hours as needed for moderate pain. Patient not taking: Reported on 03/14/2017 09/04/16   [name], [name]  HYDROcodone-acetaminophen [facility]) 5-325 MG tablet Take 1 tablet by mouth every 4 (four) hours as needed for moderate pain. Patient not taking: Reported on 03/14/2017 11/29/16   [name], MD  omeprazole (PRILOSEC) 20 MG capsule Take 20 mg by mouth daily.    [provider]  ondansetron (ZOFRAN ODT) 4 MG disintegrating tablet Take 1 tablet (4 mg total) by mouth every 8 (eight) hours as needed for nausea or vomiting. 05/03/17   Paulette Blanch, MD  ondansetron (ZOFRAN) 4 MG tablet Take 1 tablet (4 mg total) by mouth every 8 (eight) hours as needed for nausea or vomiting. Patient not taking: Reported on 03/14/2017 03/05/16   [name], MD  predniSONE (STERAPRED UNI-PAK 21 TAB) 10 MG (21) TBPK tablet Take 6 tablets on day 1. Take 5 tablets on day 2. Take 4 tablets on day 3. Take 3 tablets on day 4. Take 2 tablets on day 5. Take 1 tablets on day 6. 05/23/17   Little, Traci M, [name]    Allergies Penicillins and Morphine and related  Family History  Problem Relation Age of Onset  . Diabetes Mother    . Hypertension Mother   . Stroke Father   . Heart failure Father   . Heart failure Other     Social History Social History   Tobacco Use  . Smoking status: Current Every Day Smoker    Packs/day: 0.50    Years: 27.00    Pack years: 13.50    Types: Cigarettes  . Smokeless tobacco: Never Used  Substance Use Topics  . Alcohol use: Yes    Alcohol/week: 38.4 oz    Types: 15 Cans of beer, 44 Shots of liquor, 5 Standard drinks or equivalent per week    Comment: 4/11/23016 "I drink none to 1 pint of gin/day; 40oz beer when I'm drinking; occasional wine; probably 4 pints/wk; 4, 40oz beers/wk"  . Drug use: No    Comment: 10/17/2014 "last marijuana was in the 1990's"     Review of Systems  Constitutional: No fever/chills Eyes: No visual changes. No discharge. ENT: Positive for congestion and rhinorrhea. Cardiovascular: No chest pain. Respiratory: Negative for cough. No SOB. Gastrointestinal: No abdominal pain.  No nausea, no vomiting.  No diarrhea.  No constipation. Musculoskeletal: Negative for musculoskeletal pain. Skin: Negative for rash, abrasions, lacerations, ecchymosis. Neurological: Negative for headaches.   ____________________________________________   PHYSICAL EXAM:  VITAL SIGNS: ED Triage Vitals  Enc Vitals Group     BP 06/13/17 0945 (!) 181/105     Pulse Rate 06/13/17 0945 (!) 106     Resp 06/13/17 0945 18     Temp 06/13/17 0945 98.6 F (37 C)     Temp Source 06/13/17 0945 Oral     SpO2 06/13/17 0945 100 %     Weight 06/13/17 0943 217 lb (98.4 kg)     Height 06/13/17 0943 5\' 11"  (1.803 m)     Head Circumference --      Peak Flow --      Pain Score 06/13/17 0942 4     Pain Loc --      Pain Edu? --      Excl. in [place]? --      Constitutional: Alert and oriented. Well appearing and in no acute distress. Eyes: Conjunctivae are normal. PERRL. EOMI. No discharge. Head: Atraumatic. ENT: No frontal and maxillary sinus tenderness.      Ears: Tympanic  membranes pearly gray with good landmarks. No discharge.      Nose: Mild congestion/rhinnorhea.      Mouth/Throat: Mucous membranes are moist. Oropharynx non-erythematous. Tonsils not enlarged. No exudates. Uvula midline. Neck: No stridor.   Hematological/Lymphatic/Immunilogical: No cervical lymphadenopathy. Cardiovascular: Normal rate, regular rhythm.  Good peripheral circulation. Respiratory: Normal respiratory effort without tachypnea or retractions. Lungs CTAB. Good air entry to the bases with no decreased or absent breath sounds. Gastrointestinal: Bowel sounds 4 quadrants. Soft and nontender to palpation. No guarding or rigidity. No palpable masses.  No distention. Musculoskeletal: Full range of motion to all extremities. No gross deformities appreciated.  Normal gait.  Neurologic:  Normal speech and language. No gross focal neurologic deficits are appreciated.  Skin:  Skin is warm, dry and intact. No rash noted.   ____________________________________________   LABS (all labs ordered are listed, but only abnormal results are displayed)  Labs Reviewed - No data to display ____________________________________________  EKG   ____________________________________________  RADIOLOGY   No results found.  ____________________________________________    PROCEDURES  Procedure(s) performed:    Procedures    Medications - No data to display   ____________________________________________   INITIAL IMPRESSION / ASSESSMENT AND PLAN / ED COURSE  Pertinent labs & imaging results that were available during my care of the patient were reviewed by me and considered in my medical decision making (see chart for details).  Review of the [place] CSRS was performed in accordance of the [place] prior to dispensing any controlled drugs.   Patient's diagnosis is consistent with sinusitis and tinnitus. Vital signs and exam are reassuring. Patient appears well and is staying well hydrated.  Patient should alternate tylenol and ibuprofen for fever. Patient feels comfortable going home. Patient will be discharged home with prescriptions for doxycycline for sinus infection.  He will follow-up with ENT for tinnitus.  Patient is to follow up with PCP as needed or otherwise directed. Patient is given ED precautions to return to the ED for any worsening or new symptoms.     ____________________________________________  FINAL CLINICAL IMPRESSION(S) / ED DIAGNOSES  Final diagnoses:  Acute non-recurrent frontal sinusitis  Tinnitus of left ear      NEW MEDICATIONS STARTED DURING THIS VISIT:  ED Discharge Orders        Ordered    doxycycline (VIBRAMYCIN) 50 MG capsule  2 times daily     06/13/17 1028    fluticasone (FLONASE) 50 MCG/ACT nasal spray  Daily     06/13/17 1028          This chart was dictated using voice recognition software/Dragon. Despite best efforts to proofread, errors can occur which can change the meaning. Any change was purely unintentional.    Laban Emperor, [name] 06/13/17 1619    Lisa Roca, MD 06/17/17 [phone]

## 2017-06-13 NOTE — ED Notes (Signed)
See triage note  Developed some ringing with slight pain to left ear about 2 weeks ago   States is also making him slight;y dizzy and his balance is off slightly

## 2017-07-12 ENCOUNTER — Emergency Department: Payer: No Typology Code available for payment source

## 2017-07-12 ENCOUNTER — Emergency Department
Admission: EM | Admit: 2017-07-12 | Discharge: 2017-07-12 | Disposition: A | Discharge status: Home / Self Care | Payer: No Typology Code available for payment source | Attending: Emergency Medicine | Admitting: Emergency Medicine

## 2017-07-12 ENCOUNTER — Encounter: Payer: Self-pay | Admitting: Emergency Medicine

## 2017-07-12 DIAGNOSIS — F17210 Nicotine dependence, cigarettes, uncomplicated: Secondary | ICD-10-CM | POA: Insufficient documentation

## 2017-07-12 DIAGNOSIS — J209 Acute bronchitis, unspecified: Secondary | ICD-10-CM | POA: Insufficient documentation

## 2017-07-12 DIAGNOSIS — I10 Essential (primary) hypertension: Secondary | ICD-10-CM | POA: Diagnosis not present

## 2017-07-12 DIAGNOSIS — J029 Acute pharyngitis, unspecified: Secondary | ICD-10-CM | POA: Insufficient documentation

## 2017-07-12 DIAGNOSIS — Z79899 Other long term (current) drug therapy: Secondary | ICD-10-CM | POA: Insufficient documentation

## 2017-07-12 DIAGNOSIS — R05 Cough: Secondary | ICD-10-CM | POA: Diagnosis present

## 2017-07-12 MED ORDER — IPRATROPIUM-ALBUTEROL 0.5-2.5 (3) MG/3ML IN SOLN
3.0000 mL | Freq: Once | RESPIRATORY_TRACT | Status: AC
  Administered 2017-07-12: 3 mL via RESPIRATORY_TRACT
  Filled 2017-07-12: qty 3

## 2017-07-12 MED ORDER — PREDNISONE 20 MG PO TABS
60.0000 mg | ORAL_TABLET | Freq: Once | ORAL | Status: AC
  Administered 2017-07-12: 60 mg via ORAL
  Filled 2017-07-12: qty 3

## 2017-07-12 MED ORDER — BENZONATATE 100 MG PO CAPS: 200.0000 mg | ORAL_CAPSULE | Freq: Three times a day (TID) | ORAL | 0 refills | Status: DC | PRN

## 2017-07-12 MED ORDER — PREDNISONE 10 MG PO TABS: ORAL_TABLET | ORAL | 0 refills | Status: DC

## 2017-07-12 MED ORDER — ALBUTEROL SULFATE HFA 108 (90 BASE) MCG/ACT IN AERS: 2.0000 | INHALATION_SPRAY | Freq: Four times a day (QID) | RESPIRATORY_TRACT | 2 refills | Status: DC | PRN

## 2017-07-12 NOTE — ED Provider Notes (Signed)
[facility] Emergency Department Provider Note   ____________________________________________   First MD Initiated Contact with Patient 07/12/17 1214     (approximate)  I have reviewed the triage vital signs and the nursing notes.   HISTORY  Chief Complaint Cough and Sore Throat   HPI [name] is a 62 y.o. male is here complaining of body aches, cough, sore throat and sinus congestion for approximately 3-4 days. Patient states that it gradually reasoning to self. He has been taking Alka-Seltzer over-the-counter with relief of symptoms. He also feels himself wheezing. Patient is unaware of any fever at home. He is a smoker.  Currently he rates his pain as 4 out of 10.  Patient is also hypertensive and has not taken his blood pressure medicine today.   Past Medical History:  Diagnosis Date  . Acid reflux   . Alcohol abuse   . Arthritis    "left pinky" (10/17/2014)  . DDD (degenerative disc disease), lumbar   . Gastroenteritis   . History of stress test 2001   no ischemia  . Hypertension   . Tobacco abuse   . Vertebral compression fracture (HCC)    T11, L1    Patient Active Problem List   Diagnosis Date Noted  . Bilateral recurrent inguinal hernia without obstruction or gangrene   . Influenza 10/18/2014  . Hypoxia 10/16/2014  . SOB (shortness of breath) 10/16/2014  . Cough 10/16/2014  . Fever 10/16/2014  . Sepsis ([place]) 10/16/2014  . Nausea and vomiting 10/16/2014  . Acid reflux   . DDD (degenerative disc disease), lumbar   . Hypertension   . Tobacco abuse   . Alcohol abuse   . Essential hypertension     Past Surgical History:  Procedure Laterality Date  . COLON SURGERY    . EXPLORATORY LAPAROTOMY W/ BOWEL RESECTION  1980's    Prior to Admission medications   Medication Sig Start Date End Date Taking? Authorizing Provider  albuterol (PROVENTIL HFA;VENTOLIN HFA) 108 (90 Base) MCG/ACT inhaler Inhale 2 puffs into the  lungs every 6 (six) hours as needed for wheezing or shortness of breath. 07/12/17   Johnn Hai, [name]  amLODipine (NORVASC) 5 MG tablet Take 1 tablet (5 mg total) by mouth daily. 02/18/17 05/19/17  Menshew, Dannielle Karvonen, [name]  benzonatate (TESSALON PERLES) 100 MG capsule Take 2 capsules (200 mg total) by mouth 3 (three) times daily as needed. 07/12/17 07/12/18  Johnn Hai, [name]  dicyclomine (BENTYL) 20 MG tablet Take 1 tablet (20 mg total) by mouth every 6 (six) hours as needed. 05/03/17   Paulette Blanch, MD  fluticasone (FLONASE) 50 MCG/ACT nasal spray Place 2 sprays into both nostrils daily. 06/13/17 06/13/18  Laban Emperor, [name]  omeprazole (PRILOSEC) 20 MG capsule Take 20 mg by mouth daily.    [provider]  predniSONE (DELTASONE) 10 MG tablet Take 6 tablets  today, on day 2 take 5 tablets, day 3 take 4 tablets, day 4 take 3 tablets, day 5 take  2 tablets and 1 tablet the last day 07/12/17   Johnn Hai, [name]    Allergies Penicillins and Morphine and related  Family History  Problem Relation Age of Onset  . Diabetes Mother   . Hypertension Mother   . Stroke Father   . Heart failure Father   . Heart failure Other     Social History Social History   Tobacco Use  . Smoking status: Current Every Day Smoker  Packs/day: 0.50    Years: 27.00    Pack years: 13.50    Types: Cigarettes  . Smokeless tobacco: Never Used  Substance Use Topics  . Alcohol use: Yes    Alcohol/week: 38.4 oz    Types: 15 Cans of beer, 44 Shots of liquor, 5 Standard drinks or equivalent per week    Comment: 4/11/23016 "I drink none to 1 pint of gin/day; 40oz beer when I'm drinking; occasional wine; probably 4 pints/wk; 4, 40oz beers/wk"  . Drug use: No    Comment: 10/17/2014 "last marijuana was in the 1990's"    Review of Systems Constitutional: No fever/chills Eyes: No visual changes. ENT: positive sore throat.  Denies ear pain. Cardiovascular: Denies chest pain. Respiratory:  Denies shortness of breath.  Positive cough. Gastrointestinal:  No nausea, no vomiting.  Musculoskeletal: positive for muscle aches. Skin: Negative for rash. Neurological: Negative for headaches, focal weakness or numbness. ____________________________________________   PHYSICAL EXAM:  VITAL SIGNS: ED Triage Vitals  Enc Vitals Group     BP 07/12/17 1113 (!) 163/103     Pulse Rate 07/12/17 1113 (!) 103     Resp 07/12/17 1113 18     Temp 07/12/17 1113 99.4 F (37.4 C)     Temp Source 07/12/17 1113 Oral     SpO2 07/12/17 1113 97 %     Weight 07/12/17 1113 217 lb (98.4 kg)     Height 07/12/17 1113 5\' 11"  (1.803 m)     Head Circumference --      Peak Flow --      Pain Score 07/12/17 1112 4     Pain Loc --      Pain Edu? --      Excl. in [place]? --    Constitutional: Alert and oriented. Well appearing and in no acute distress. Eyes: Conjunctivae are normal.  Head: Atraumatic. Nose: mild congestion/no rhinnorhea. Mouth/Throat: Mucous membranes are moist.  Oropharynx non-erythematous. Uvula is midline. Posterior drainage seen. Neck: No stridor.   Hematological/Lymphatic/Immunilogical: No cervical lymphadenopathy. Cardiovascular: Normal rate, regular rhythm. Grossly normal heart sounds.  Good peripheral circulation. Respiratory: Normal respiratory effort.  No retractions. Lungs expiratory wheezes are heard throughout with a very congested cough which sounds bronchitic. Musculoskeletal: No lower extremity tenderness nor edema.   Neurologic:  Normal speech and language. No gross focal neurologic deficits are appreciated. No gait instability. Skin:  Skin is warm, dry and intact. No rash noted. Psychiatric: Mood and affect are normal. Speech and behavior are normal.  ____________________________________________   LABS (all labs ordered are listed, but only abnormal results are displayed)  Labs Reviewed - No data to display  RADIOLOGY  Dg Chest 2 View  Result Date:  07/12/2017 CLINICAL DATA:  Cough, wheezing EXAM: CHEST  2 VIEW COMPARISON:  03/14/2017 FINDINGS: Lungs are clear.  No pleural effusion or pneumothorax. The heart is normal in size. Mild degenerative changes of the visualized thoracolumbar spine. IMPRESSION: No evidence of acute cardiopulmonary disease. Electronically Signed   By: Julian Hy M.D.   On: 07/12/2017 13:43    ____________________________________________   PROCEDURES  Procedure(s) performed: None  Procedures  Critical Care performed: No  ____________________________________________   INITIAL IMPRESSION / ASSESSMENT AND PLAN / ED COURSE  Patient was given a DuoNeb treatment while in the ED and improved. He is given a prescription for prednisone 60 mg 6 day taper, albuterol inhaler 2 puffs 4 times a day as needed for cough or wheezing and Tessalon Perles. He is to follow-up  with his PCP or [facility]  clinic if any continued problems. He is encouraged to discontinue smoking and also discontinue taking Alka-Seltzer as it has elevated his blood pressure. We also discussed blood pressure medication and that he should take it daily.  ____________________________________________   FINAL CLINICAL IMPRESSION(S) / ED DIAGNOSES  Final diagnoses:  Acute bronchitis, unspecified organism     ED Discharge Orders        Ordered    predniSONE (DELTASONE) 10 MG tablet     07/12/17 1433    albuterol (PROVENTIL HFA;VENTOLIN HFA) 108 (90 Base) MCG/ACT inhaler  Every 6 hours PRN     07/12/17 1433    benzonatate (TESSALON PERLES) 100 MG capsule  3 times daily PRN     07/12/17 1433       Note:  This document was prepared using Dragon voice recognition software and may include unintentional dictation errors.    Johnn Hai, [name] 07/12/17 1446    Lisa Roca, MD 07/12/17 [phone]

## 2017-07-12 NOTE — Discharge Instructions (Signed)
Follow-up with [facility] acute-care if any continued problems. Begin taking prednisone as directed over the next 6 days. Tessalon Perles 2 capsules every 8 hours as needed for cough. Also use albuterol inhaler 2 puffs every 6 hours as needed for coughing or wheezing.

## 2017-07-12 NOTE — ED Triage Notes (Signed)
Pt reports body aches, cough, sore throat and sinus congestion. Pt reports he's been blowing his nose and it's been bleeding intermittently for the past two days.

## 2017-08-22 ENCOUNTER — Observation Stay
Admission: EM | Admit: 2017-08-22 | Discharge: 2017-08-23 | Disposition: A | Discharge status: Home / Self Care | Payer: PRIVATE HEALTH INSURANCE | Attending: Internal Medicine | Admitting: Internal Medicine

## 2017-08-22 ENCOUNTER — Emergency Department: Payer: PRIVATE HEALTH INSURANCE

## 2017-08-22 ENCOUNTER — Observation Stay: Payer: PRIVATE HEALTH INSURANCE

## 2017-08-22 ENCOUNTER — Other Ambulatory Visit: Payer: Self-pay

## 2017-08-22 DIAGNOSIS — Z23 Encounter for immunization: Secondary | ICD-10-CM | POA: Insufficient documentation

## 2017-08-22 DIAGNOSIS — S32019S Unspecified fracture of first lumbar vertebra, sequela: Secondary | ICD-10-CM | POA: Insufficient documentation

## 2017-08-22 DIAGNOSIS — Z823 Family history of stroke: Secondary | ICD-10-CM | POA: Insufficient documentation

## 2017-08-22 DIAGNOSIS — I340 Nonrheumatic mitral (valve) insufficiency: Secondary | ICD-10-CM | POA: Diagnosis not present

## 2017-08-22 DIAGNOSIS — Z7951 Long term (current) use of inhaled steroids: Secondary | ICD-10-CM | POA: Insufficient documentation

## 2017-08-22 DIAGNOSIS — I10 Essential (primary) hypertension: Secondary | ICD-10-CM | POA: Insufficient documentation

## 2017-08-22 DIAGNOSIS — F1010 Alcohol abuse, uncomplicated: Secondary | ICD-10-CM | POA: Insufficient documentation

## 2017-08-22 DIAGNOSIS — Z833 Family history of diabetes mellitus: Secondary | ICD-10-CM | POA: Insufficient documentation

## 2017-08-22 DIAGNOSIS — Z885 Allergy status to narcotic agent status: Secondary | ICD-10-CM | POA: Diagnosis not present

## 2017-08-22 DIAGNOSIS — X58XXXS Exposure to other specified factors, sequela: Secondary | ICD-10-CM | POA: Diagnosis not present

## 2017-08-22 DIAGNOSIS — F17210 Nicotine dependence, cigarettes, uncomplicated: Secondary | ICD-10-CM | POA: Insufficient documentation

## 2017-08-22 DIAGNOSIS — R4781 Slurred speech: Secondary | ICD-10-CM | POA: Diagnosis not present

## 2017-08-22 DIAGNOSIS — S22089S Unspecified fracture of T11-T12 vertebra, sequela: Secondary | ICD-10-CM | POA: Insufficient documentation

## 2017-08-22 DIAGNOSIS — M5136 Other intervertebral disc degeneration, lumbar region: Secondary | ICD-10-CM | POA: Diagnosis not present

## 2017-08-22 DIAGNOSIS — R29810 Facial weakness: Secondary | ICD-10-CM | POA: Diagnosis not present

## 2017-08-22 DIAGNOSIS — G459 Transient cerebral ischemic attack, unspecified: Principal | ICD-10-CM | POA: Diagnosis present

## 2017-08-22 DIAGNOSIS — Z79899 Other long term (current) drug therapy: Secondary | ICD-10-CM | POA: Insufficient documentation

## 2017-08-22 DIAGNOSIS — R0902 Hypoxemia: Secondary | ICD-10-CM | POA: Diagnosis not present

## 2017-08-22 DIAGNOSIS — H53122 Transient visual loss, left eye: Secondary | ICD-10-CM | POA: Diagnosis not present

## 2017-08-22 DIAGNOSIS — M1990 Unspecified osteoarthritis, unspecified site: Secondary | ICD-10-CM | POA: Diagnosis not present

## 2017-08-22 DIAGNOSIS — Z8249 Family history of ischemic heart disease and other diseases of the circulatory system: Secondary | ICD-10-CM | POA: Insufficient documentation

## 2017-08-22 DIAGNOSIS — K219 Gastro-esophageal reflux disease without esophagitis: Secondary | ICD-10-CM | POA: Diagnosis not present

## 2017-08-22 DIAGNOSIS — Z7982 Long term (current) use of aspirin: Secondary | ICD-10-CM | POA: Diagnosis not present

## 2017-08-22 DIAGNOSIS — Z880 Allergy status to penicillin: Secondary | ICD-10-CM | POA: Diagnosis not present

## 2017-08-22 DIAGNOSIS — I639 Cerebral infarction, unspecified: Secondary | ICD-10-CM

## 2017-08-22 HISTORY — DX: Transient cerebral ischemic attack, unspecified: G45.9

## 2017-08-22 LAB — CBC WITH DIFFERENTIAL/PLATELET
Basophils Absolute: 0 10*3/uL (ref 0–0.1)
Basophils Relative: 1 %
EOS ABS: 0 10*3/uL (ref 0–0.7)
Eosinophils Relative: 1 %
HEMATOCRIT: 38.7 % — AB (ref 40.0–52.0)
HEMOGLOBIN: 12.9 g/dL — AB (ref 13.0–18.0)
LYMPHS ABS: 1.4 10*3/uL (ref 1.0–3.6)
LYMPHS PCT: 36 %
MCH: 29.4 pg (ref 26.0–34.0)
MCHC: 33.3 g/dL (ref 32.0–36.0)
MCV: 88.2 fL (ref 80.0–100.0)
MONOS PCT: 10 %
Monocytes Absolute: 0.4 10*3/uL (ref 0.2–1.0)
NEUTROS ABS: 2 10*3/uL (ref 1.4–6.5)
Neutrophils Relative %: 52 %
Platelets: 174 10*3/uL (ref 150–440)
RBC: 4.39 MIL/uL — AB (ref 4.40–5.90)
RDW: 14.7 % — ABNORMAL HIGH (ref 11.5–14.5)
WBC: 3.9 10*3/uL (ref 3.8–10.6)

## 2017-08-22 LAB — COMPREHENSIVE METABOLIC PANEL
ALBUMIN: 4.1 g/dL (ref 3.5–5.0)
ALT: 19 U/L (ref 17–63)
ANION GAP: 13 (ref 5–15)
AST: 26 U/L (ref 15–41)
Alkaline Phosphatase: 43 U/L (ref 38–126)
BILIRUBIN TOTAL: 0.7 mg/dL (ref 0.3–1.2)
BUN: 11 mg/dL (ref 6–20)
CALCIUM: 8.6 mg/dL — AB (ref 8.9–10.3)
CO2: 20 mmol/L — ABNORMAL LOW (ref 22–32)
CREATININE: 0.75 mg/dL (ref 0.61–1.24)
Chloride: 108 mmol/L (ref 101–111)
GFR calc Af Amer: >60 mL/min (ref >60)
GFR calc non Af Amer: >60 mL/min (ref >60)
GLUCOSE: 112 mg/dL — AB (ref 65–99)
Potassium: 3.3 mmol/L — ABNORMAL LOW (ref 3.5–5.1)
Sodium: 141 mmol/L (ref 135–145)
TOTAL PROTEIN: 7.1 g/dL (ref 6.5–8.1)

## 2017-08-22 LAB — GLUCOSE, CAPILLARY: Glucose-Capillary: 111 mg/dL — ABNORMAL HIGH (ref 65–99)

## 2017-08-22 LAB — MAGNESIUM: Magnesium: 1.6 mg/dL — ABNORMAL LOW (ref 1.7–2.4)

## 2017-08-22 MED ORDER — ACETAMINOPHEN 325 MG PO TABS: 650.0000 mg | ORAL_TABLET | Freq: Four times a day (QID) | ORAL | Status: DC | PRN

## 2017-08-22 MED ORDER — ACETAMINOPHEN 650 MG RE SUPP
650.0000 mg | Freq: Four times a day (QID) | RECTAL | Status: DC | PRN
Start: 2017-08-22 — End: 2017-08-23

## 2017-08-22 MED ORDER — INFLUENZA VAC SPLIT QUAD 0.5 ML IM SUSY
0.5000 mL | PREFILLED_SYRINGE | INTRAMUSCULAR | Status: AC
  Administered 2017-08-23: 0.5 mL via INTRAMUSCULAR
  Filled 2017-08-22: qty 0.5

## 2017-08-22 MED ORDER — ALBUTEROL SULFATE (2.5 MG/3ML) 0.083% IN NEBU: 3.0000 mL | INHALATION_SOLUTION | Freq: Four times a day (QID) | RESPIRATORY_TRACT | Status: DC | PRN

## 2017-08-22 MED ORDER — ONDANSETRON HCL 4 MG/2ML IJ SOLN: 4.0000 mg | Freq: Four times a day (QID) | INTRAMUSCULAR | Status: DC | PRN

## 2017-08-22 MED ORDER — POTASSIUM CHLORIDE CRYS ER 20 MEQ PO TBCR
40.0000 meq | EXTENDED_RELEASE_TABLET | Freq: Once | ORAL | Status: AC
  Administered 2017-08-22: 40 meq via ORAL
  Filled 2017-08-22 (×2): qty 2

## 2017-08-22 MED ORDER — PANTOPRAZOLE SODIUM 40 MG PO TBEC
40.0000 mg | DELAYED_RELEASE_TABLET | Freq: Every day | ORAL | Status: DC
  Administered 2017-08-23: 10:00:00 40 mg via ORAL
  Filled 2017-08-22: qty 1

## 2017-08-22 MED ORDER — ONDANSETRON HCL 4 MG PO TABS: 4.0000 mg | ORAL_TABLET | Freq: Four times a day (QID) | ORAL | Status: DC | PRN

## 2017-08-22 MED ORDER — METOPROLOL TARTRATE 25 MG PO TABS
12.5000 mg | ORAL_TABLET | Freq: Two times a day (BID) | ORAL | Status: DC
  Administered 2017-08-22 – 2017-08-23 (×2): 12.5 mg via ORAL
  Filled 2017-08-22 (×2): qty 1

## 2017-08-22 MED ORDER — HYDRALAZINE HCL 20 MG/ML IJ SOLN: 10.0000 mg | Freq: Four times a day (QID) | INTRAMUSCULAR | Status: DC | PRN

## 2017-08-22 MED ORDER — ASPIRIN EC 81 MG PO TBEC
81.0000 mg | DELAYED_RELEASE_TABLET | Freq: Every day | ORAL | Status: DC
  Administered 2017-08-22 – 2017-08-23 (×2): 81 mg via ORAL
  Filled 2017-08-22 (×2): qty 1

## 2017-08-22 MED ORDER — ENOXAPARIN SODIUM 40 MG/0.4ML ~~LOC~~ SOLN
40.0000 mg | SUBCUTANEOUS | Status: DC
  Administered 2017-08-22: 40 mg via SUBCUTANEOUS
  Filled 2017-08-22: qty 0.4

## 2017-08-22 NOTE — ED Triage Notes (Signed)
Pt brought in by [facility] from work.  At work pt reports that his vision got blurry and that coworkers were reporting a facial droop.  At the time EMS arrived, all symptoms had resolved and pt is negative for [facility] stroke screen at this time.   Pt does report intermittent numbness/tingling to the L side of his face, but states that it does not feel any different.  Pt is A&Ox4, in NAD.  Pt reports that he was having a headache around 11am and took [facility] powder at that time.  Headache has somewhat resolved.

## 2017-08-22 NOTE — ED Provider Notes (Signed)
[facility] Emergency Department Provider Note   ____________________________________________   First MD Initiated Contact with Patient 08/22/17 1531     (approximate)  I have reviewed the triage vital signs and the nursing notes.   HISTORY  Chief Complaint Facial Droop and Blurred Vision  Chief complaint is possible stroke  HPI [name] is a 62 y.o. male and has a history of high blood pressure he reports she stopped seeing his doctor and hasn't been taking his blood pressure medication for about 2 months. Today working at his left visual field cut and his coworker said he had a left-sided facial droop. They called EMS and came here. On arrival in the emergency room he did no longer have a visual field cut in the left side of his face was not droopy any longer. He had some waxing and waning of left-sided facial numbness and currently he does not have any symptoms.   Past Medical History:  Diagnosis Date  . Acid reflux   . Alcohol abuse   . Arthritis    "left pinky" (10/17/2014)  . DDD (degenerative disc disease), lumbar   . Gastroenteritis   . History of stress test 2001   no ischemia  . Hypertension   . Tobacco abuse   . Vertebral compression fracture (HCC)    T11, L1    Patient Active Problem List   Diagnosis Date Noted  . Bilateral recurrent inguinal hernia without obstruction or gangrene   . Influenza 10/18/2014  . Hypoxia 10/16/2014  . SOB (shortness of breath) 10/16/2014  . Cough 10/16/2014  . Fever 10/16/2014  . Sepsis ([place]) 10/16/2014  . Nausea and vomiting 10/16/2014  . Acid reflux   . DDD (degenerative disc disease), lumbar   . Hypertension   . Tobacco abuse   . Alcohol abuse   . Essential hypertension     Past Surgical History:  Procedure Laterality Date  . COLON SURGERY    . EXPLORATORY LAPAROTOMY W/ BOWEL RESECTION  1980's    Prior to Admission medications   Medication Sig Start Date End Date Taking?  Authorizing Provider  albuterol (PROVENTIL HFA;VENTOLIN HFA) 108 (90 Base) MCG/ACT inhaler Inhale 2 puffs into the lungs every 6 (six) hours as needed for wheezing or shortness of breath. 07/12/17  Yes Johnn Hai, [name]  omeprazole (PRILOSEC) 20 MG capsule Take 20 mg by mouth daily.   Yes [provider]  benzonatate (TESSALON PERLES) 100 MG capsule Take 2 capsules (200 mg total) by mouth 3 (three) times daily as needed. Patient not taking: Reported on 08/22/2017 07/12/17 07/12/18  Johnn Hai, [name]  dicyclomine (BENTYL) 20 MG tablet Take 1 tablet (20 mg total) by mouth every 6 (six) hours as needed. Patient not taking: Reported on 08/22/2017 05/03/17   Paulette Blanch, MD  fluticasone [facility]) 50 MCG/ACT nasal spray Place 2 sprays into both nostrils daily. Patient not taking: Reported on 08/22/2017 06/13/17 06/13/18  Laban Emperor, [name]    Allergies Penicillins and Morphine and related  Family History  Problem Relation Age of Onset  . Diabetes Mother   . Hypertension Mother   . Stroke Father   . Heart failure Father   . Heart failure Other     Social History Social History   Tobacco Use  . Smoking status: Current Every Day Smoker    Packs/day: 0.50    Years: 27.00    Pack years: 13.50    Types: Cigarettes  . Smokeless tobacco:  Never Used  Substance Use Topics  . Alcohol use: Yes    Alcohol/week: 38.4 oz    Types: 15 Cans of beer, 44 Shots of liquor, 5 Standard drinks or equivalent per week    Comment: 4/11/23016 "I drink none to 1 pint of gin/day; 40oz beer when I'm drinking; occasional wine; probably 4 pints/wk; 4, 40oz beers/wk"  . Drug use: No    Comment: 10/17/2014 "last marijuana was in the 1990's"    Review of Systems  Constitutional: No fever/chills Eyes: No visual changes. ENT: No sore throat. Cardiovascular: Denies chest pain. Respiratory: Denies shortness of breath. Gastrointestinal: No abdominal pain.  No nausea, no vomiting.  No diarrhea.  No  constipation. Genitourinary: Negative for dysuria. Musculoskeletal: Negative for back pain. Skin: Negative for rash. Neurological: see history of present illness  ____________________________________________   PHYSICAL EXAM:  VITAL SIGNS: ED Triage Vitals  Enc Vitals Group     BP      Pulse      Resp      Temp      Temp src      SpO2      Weight      Height      Head Circumference      Peak Flow      Pain Score      Pain Loc      Pain Edu?      Excl. in [place]?    Constitutional: Alert and oriented. Well appearing and in no acute distress. Eyes: Conjunctivae are normal. PER. EOMI.normal visual fields Head: Atraumatic. Nose: No congestion/rhinnorhea. Mouth/Throat: Mucous membranes are moist.  Oropharynx non-erythematous. Neck: No stridor.   Cardiovascular: Normal rate, regular rhythm. Grossly normal heart sounds.  Good peripheral circulation. Respiratory: Normal respiratory effort.  No retractions. Lungs CTAB. Gastrointestinal: Soft and nontender. No distention. No abdominal bruits. No CVA tenderness. Musculoskeletal: No lower extremity tenderness nor edema.  No joint effusions. Neurologic:  Normal speech and language. No gross focal neurologic deficits are appreciated. specifically cranial nerves II through XII are intact cerebellar rapid alternating movements in the hands and heel-to-shin and legs and normal motor strength is 5 over 5 throughout and when I checked him there were no numbness anywhere. Skin:  Skin is warm, dry and intact. No rash noted. Psychiatric: Mood and affect are normal. Speech and behavior are normal.  ____________________________________________   LABS (all labs ordered are listed, but only abnormal results are displayed)  Labs Reviewed  COMPREHENSIVE METABOLIC PANEL - Abnormal; Notable for the following components:      Result Value   Potassium 3.3 (*)    CO2 20 (*)    Glucose, Bld 112 (*)    Calcium 8.6 (*)    All other components within  normal limits  CBC WITH DIFFERENTIAL/PLATELET - Abnormal; Notable for the following components:   RBC 4.39 (*)    Hemoglobin 12.9 (*)    HCT 38.7 (*)    RDW 14.7 (*)    All other components within normal limits  GLUCOSE, CAPILLARY - Abnormal; Notable for the following components:   Glucose-Capillary 111 (*)    All other components within normal limits   ____________________________________________  EKG  EKG read and interpreted by me shows sinus rhythm rate of 98 left axis he is got a right bundle blanch block in the computer is reading left anterior hemiblock as well in no acute ST-T wave changes ____________________________________________  RADIOLOGY  ED MD interpretation: head CT shows no acute disease  per radiology chest x-ray is within normal limits  Official radiology report(s): Ct Head Wo Contrast  Result Date: 08/22/2017 CLINICAL DATA:  Focal neurologic deficit, reportedly resolved. No reported injury. EXAM: CT HEAD WITHOUT CONTRAST TECHNIQUE: Contiguous axial images were obtained from the base of the skull through the vertex without intravenous contrast. COMPARISON:  06/18/2013 head CT. FINDINGS: Brain: No evidence of parenchymal hemorrhage or extra-axial fluid collection. No mass lesion, mass effect, or midline shift. No CT evidence of acute infarction. Cerebral volume is age appropriate. No ventriculomegaly. Vascular: No acute abnormality. Skull: No evidence of calvarial fracture. Sinuses/Orbits: The visualized paranasal sinuses are essentially clear. Other:  The mastoid air cells are unopacified. IMPRESSION: Negative head CT.  No evidence of acute intracranial abnormality. Electronically Signed   By: Ilona Sorrel M.D.   On: 08/22/2017 16:01   Dg Chest Portable 1 View  Result Date: 08/22/2017 CLINICAL DATA:  TIA EXAM: PORTABLE CHEST 1 VIEW COMPARISON:  07/12/2017 FINDINGS: The heart size and mediastinal contours are within normal limits. Both lungs are clear. The visualized  skeletal structures are unremarkable. IMPRESSION: No active disease. Electronically Signed   By: Kathreen Devoid   On: 08/22/2017 16:01    ____________________________________________   PROCEDURES  Procedure(s) performed:   Procedures  Critical Care performed:  ____________________________________________   INITIAL IMPRESSION / ASSESSMENT AND PLAN / ED COURSE   by history patient is had a TIA. Symptoms have resolved. We'll admit him for workup.        ____________________________________________   FINAL CLINICAL IMPRESSION(S) / ED DIAGNOSES  Final diagnoses:  TIA (transient ischemic attack)     ED Discharge Orders    None       Note:  This document was prepared using Dragon voice recognition software and may include unintentional dictation errors.    Nena Polio, MD 08/22/17 [phone]

## 2017-08-22 NOTE — Progress Notes (Signed)
Ch. Was paged for code stroke. Pt reject [place] service.   08/22/17 1600  Clinical Encounter Type  Visited With Patient  Visit Type Initial  Referral From Nurse  Spiritual Encounters  Spiritual Needs Prayer

## 2017-08-22 NOTE — H&P (Signed)
[place] [facility] [place] NAME: [name]    MR#:  [id number]  DATE OF BIRTH:  May 05, 1956  DATE OF ADMISSION:  08/22/2017  PRIMARY CARE PHYSICIAN: Patient, No Pcp Per   REQUESTING/REFERRING PHYSICIAN: Dr. Conni Slipper  CHIEF COMPLAINT:   Chief Complaint  Patient presents with  . Facial Droop  . Blurred Vision    HISTORY OF PRESENT ILLNESS:  [name]  is a 62 y.o. male with a known history of hypertension, tobacco abuse, history of alcohol abuse, GERD who presents to the hospital due to left-sided facial droop, slurred speech, loss of peripheral vision on the left eye that began this afternoon and lasted for about 2 hours and resolved on its own. Patient presented to the emergency room underwent a CT scan of the head which was negative for acute pathology. Given patient's transient neurologic symptoms which have resolved there was concern for underlying TIA/CVA and therefore hospitalist services were contacted further treatment and evaluation. Patient denies any headache, numbness, tingling, nausea, vomiting, chest pain or any other associated symptoms presently.  PAST MEDICAL HISTORY:   Past Medical History:  Diagnosis Date  . Acid reflux   . Alcohol abuse   . Arthritis    "left pinky" (10/17/2014)  . DDD (degenerative disc disease), lumbar   . Gastroenteritis   . History of stress test 2001   no ischemia  . Hypertension   . Tobacco abuse   . Vertebral compression fracture (HCC)    T11, L1    PAST SURGICAL HISTORY:   Past Surgical History:  Procedure Laterality Date  . COLON SURGERY    . EXPLORATORY LAPAROTOMY W/ BOWEL RESECTION  1980's    SOCIAL HISTORY:   Social History   Tobacco Use  . Smoking status: Current Every Day Smoker    Packs/day: 0.50    Years: 27.00    Pack years: 13.50    Types: Cigarettes  . Smokeless tobacco: Never Used  Substance Use Topics  . Alcohol use: Yes    Alcohol/week: 38.4 oz     Types: 15 Cans of beer, 44 Shots of liquor, 5 Standard drinks or equivalent per week    Comment: 4/11/23016 "I drink none to 1 pint of gin/day; 40oz beer when I'm drinking; occasional wine; probably 4 pints/wk; 4, 40oz beers/wk"    FAMILY HISTORY:   Family History  Problem Relation Age of Onset  . Diabetes Mother   . Hypertension Mother   . Stroke Father   . Heart failure Father   . Heart failure Other     DRUG ALLERGIES:   Allergies  Allergen Reactions  . Penicillins Anaphylaxis    Has patient had a PCN reaction causing immediate rash, facial/tongue/throat swelling, SOB or lightheadedness with hypotension: Yes Has patient had a PCN reaction causing severe rash involving mucus membranes or skin necrosis: No Has patient had a PCN reaction that required hospitalization Yes Has patient had a PCN reaction occurring within the last 10 years: No If all of the above answers are "NO", then may proceed with Cephalosporin use.  Marland Kitchen Morphine And Related Itching    REVIEW OF SYSTEMS:   Review of Systems  Constitutional: Negative for fever and weight loss.  HENT: Negative for congestion, nosebleeds and tinnitus.   Eyes: Negative for blurred vision, double vision and redness.  Respiratory: Negative for cough, hemoptysis and shortness of breath.   Cardiovascular: Negative for chest pain, orthopnea, leg swelling and PND.  Gastrointestinal:  Negative for abdominal pain, diarrhea, melena, nausea and vomiting.  Genitourinary: Negative for dysuria, hematuria and urgency.  Musculoskeletal: Negative for falls and joint pain.  Neurological: Positive for speech change. Negative for dizziness, tingling, focal weakness, seizures, weakness and headaches.  Endo/Heme/Allergies: Negative for polydipsia. Does not bruise/bleed easily.  Psychiatric/Behavioral: Negative for depression and memory loss. The patient is not nervous/anxious.     MEDICATIONS AT HOME:   Prior to Admission medications   Medication  Sig Start Date End Date Taking? Authorizing Provider  albuterol (PROVENTIL HFA;VENTOLIN HFA) 108 (90 Base) MCG/ACT inhaler Inhale 2 puffs into the lungs every 6 (six) hours as needed for wheezing or shortness of breath. 07/12/17  Yes Johnn Hai, [name]  omeprazole (PRILOSEC) 20 MG capsule Take 20 mg by mouth daily.   Yes [provider]      VITAL SIGNS:  Blood pressure (!) 148/92, pulse 93, temperature 98.7 F (37.1 C), resp. rate 18, weight 98.4 kg (217 lb), SpO2 99 %.  PHYSICAL EXAMINATION:  Physical Exam  GENERAL:  62 y.o.-year-old patient lying in the bed with no acute distress.  EYES: Pupils equal, round, reactive to light and accommodation. No scleral icterus. Extraocular muscles intact.  HEENT: Head atraumatic, normocephalic. Oropharynx and nasopharynx clear. No oropharyngeal erythema, moist oral mucosa  NECK:  Supple, no jugular venous distention. No thyroid enlargement, no tenderness.  LUNGS: Normal breath sounds bilaterally, no wheezing, rales, rhonchi. No use of accessory muscles of respiration.  CARDIOVASCULAR: S1, S2 RRR. No murmurs, rubs, gallops, clicks.  ABDOMEN: Soft, nontender, nondistended. Bowel sounds present. No organomegaly or mass.  EXTREMITIES: No pedal edema, cyanosis, or clubbing. + 2 pedal & radial pulses b/l.   NEUROLOGIC: Cranial nerves II through XII are intact. No focal Motor or sensory deficits appreciated b/l PSYCHIATRIC: The patient is alert and oriented x 3.  SKIN: No obvious rash, lesion, or ulcer.   LABORATORY PANEL:   CBC Recent Labs  Lab 08/22/17 1533  WBC 3.9  HGB 12.9*  HCT 38.7*  PLT 174   ------------------------------------------------------------------------------------------------------------------  Chemistries  Recent Labs  Lab 08/22/17 1533  NA 141  K 3.3*  CL 108  CO2 20*  GLUCOSE 112*  BUN 11  CREATININE 0.75  CALCIUM 8.6*  AST 26  ALT 19  ALKPHOS 43  BILITOT 0.7    ------------------------------------------------------------------------------------------------------------------  Cardiac Enzymes No results for input(s): TROPONINI in the last 168 hours. ------------------------------------------------------------------------------------------------------------------  RADIOLOGY:  Ct Head Wo Contrast  Result Date: 08/22/2017 CLINICAL DATA:  Focal neurologic deficit, reportedly resolved. No reported injury. EXAM: CT HEAD WITHOUT CONTRAST TECHNIQUE: Contiguous axial images were obtained from the base of the skull through the vertex without intravenous contrast. COMPARISON:  06/18/2013 head CT. FINDINGS: Brain: No evidence of parenchymal hemorrhage or extra-axial fluid collection. No mass lesion, mass effect, or midline shift. No CT evidence of acute infarction. Cerebral volume is age appropriate. No ventriculomegaly. Vascular: No acute abnormality. Skull: No evidence of calvarial fracture. Sinuses/Orbits: The visualized paranasal sinuses are essentially clear. Other:  The mastoid air cells are unopacified. IMPRESSION: Negative head CT.  No evidence of acute intracranial abnormality. Electronically Signed   By: Ilona Sorrel M.D.   On: 08/22/2017 16:01   Dg Chest Portable 1 View  Result Date: 08/22/2017 CLINICAL DATA:  TIA EXAM: PORTABLE CHEST 1 VIEW COMPARISON:  07/12/2017 FINDINGS: The heart size and mediastinal contours are within normal limits. Both lungs are clear. The visualized skeletal structures are unremarkable. IMPRESSION: No active disease. Electronically Signed  By: Kathreen Devoid   On: 08/22/2017 16:01     IMPRESSION AND PLAN:   62 year old male with past medical history of hypertension, GERD who presents to the hospital due to left-sided facial droop, slurred speech and blurry vision which has resolved now.  1. TIA/CVA-this is working diagnosis given the patient's left facial droop, slurred speech and blurry vision which have all resolved now.  Patient does have significant risk factors given his untreated hypertension, ongoing tobacco abuse. -CT head is negative for acute pathology. I will start the patient on some aspirin, get a carotid duplex, MRI of the brain. Check echocardiogram.If patient's workup was negative he can likely be discharged without neurological follow-up.  2. Essential hypertension-patient used to be on meds is currently not taking any medications. -I'll start patient on some low-dose metoprolol. Place him on as needed IV hydralazine.  3. GERD-continue Protonix.  4.. Hypokalemia-I'll place the patient on some oral potassium. Repeat level in the morning. - Check Mg. Level.   All the records are reviewed and case discussed with ED provider. Management plans discussed with the patient, family and they are in agreement.  CODE STATUS: Full code  TOTAL TIME TAKING CARE OF THIS PATIENT: 40 minutes.    Henreitta Leber M.D on 08/22/2017 at 5:31 PM  Between 7am to 6pm - Pager - [phone]  After 6pm go to [email or web address] - password [email or web address] [facility]  Office  [phone]  CC: Primary care physician; Patient, No Pcp Per

## 2017-08-23 ENCOUNTER — Observation Stay: Payer: PRIVATE HEALTH INSURANCE

## 2017-08-23 ENCOUNTER — Observation Stay (HOSPITAL_BASED_OUTPATIENT_CLINIC_OR_DEPARTMENT_OTHER)
Admit: 2017-08-23 | Discharge: 2017-08-23 | Disposition: A | Discharge status: Home / Self Care | Payer: PRIVATE HEALTH INSURANCE | Attending: Specialist | Admitting: Specialist

## 2017-08-23 DIAGNOSIS — I340 Nonrheumatic mitral (valve) insufficiency: Secondary | ICD-10-CM

## 2017-08-23 DIAGNOSIS — G459 Transient cerebral ischemic attack, unspecified: Secondary | ICD-10-CM | POA: Diagnosis not present

## 2017-08-23 LAB — ECHOCARDIOGRAM COMPLETE
Height: 71 in
Weight: 3396.85 oz

## 2017-08-23 LAB — POTASSIUM: POTASSIUM: 3.9 mmol/L (ref 3.5–5.1)

## 2017-08-23 MED ORDER — AMLODIPINE BESYLATE 5 MG PO TABS: 5.0000 mg | ORAL_TABLET | Freq: Every day | ORAL | 0 refills | Status: DC

## 2017-08-23 MED ORDER — ATORVASTATIN CALCIUM 10 MG PO TABS: 10.0000 mg | ORAL_TABLET | Freq: Every day | ORAL | 0 refills | Status: DC

## 2017-08-23 MED ORDER — ASPIRIN 81 MG PO TBEC: 81.0000 mg | DELAYED_RELEASE_TABLET | Freq: Every day | ORAL | 0 refills | Status: DC

## 2017-08-23 NOTE — Progress Notes (Signed)
Discharge instructions along with home medications and follow up gone over with patient and family member. Both verbalize that they understood instructions. No prescriptions given to patient. IV and tele removed. Pt being discharged home on room air, no distress noted. Ammie Dalton, RN

## 2017-08-23 NOTE — Progress Notes (Signed)
Neurology:  62 y.o with hx of ETOH use, GERD presented with dysarthria, bilateral peripheral vision loss that resolved within 2 hrs.  No abnormalities on CTH, MRI brain, carotid ultrasounds.   Pt is back to baseline  D/c today.   Home daily ASA which patient was not taking prior

## 2017-08-25 LAB — HIV ANTIBODY (ROUTINE TESTING W REFLEX): HIV Screen 4th Generation wRfx: NONREACTIVE

## 2017-08-29 NOTE — Discharge Summary (Signed)
[facility] [place] NAME: [name]    MR#:  [id number]  DATE OF BIRTH:  1956-06-22  DATE OF ADMISSION:  08/22/2017 ADMITTING PHYSICIAN: Henreitta Leber, MD  DATE OF DISCHARGE: 08/23/2017  1:30 PM  PRIMARY CARE PHYSICIAN: Patient, No Pcp Per   ADMISSION DIAGNOSIS:  TIA (transient ischemic attack) [G45.9]  DISCHARGE DIAGNOSIS:  Active Problems:   TIA (transient ischemic attack)  SECONDARY DIAGNOSIS:   Past Medical History:  Diagnosis Date  . Acid reflux   . Alcohol abuse   . Arthritis    "left pinky" (10/17/2014)  . DDD (degenerative disc disease), lumbar   . Gastroenteritis   . History of stress test 2001   no ischemia  . Hypertension   . Tobacco abuse   . Vertebral compression fracture (HCC)    T11, L1   ADMITTING HISTORY  HISTORY OF PRESENT ILLNESS:  [name]  is a 62 y.o. male with a known history of hypertension, tobacco abuse, history of alcohol abuse, GERD who presents to the hospital due to left-sided facial droop, slurred speech, loss of peripheral vision on the left eye that began this afternoon and lasted for about 2 hours and resolved on its own. Patient presented to the emergency room underwent a CT scan of the head which was negative for acute pathology. Given patient's transient neurologic symptoms which have resolved there was concern for underlying TIA/CVA and therefore hospitalist services were contacted further treatment and evaluation. Patient denies any headache, numbness, tingling, nausea, vomiting, chest pain or any other associated symptoms presently.  HOSPITAL COURSE:   *TIA.  Patient symptoms completely resolved by the day of discharge.  He is ambulating well.  No change in vision.  No trouble swallowing.  Patient has been started on aspirin and Lipitor.  MRI of the brain showed no acute stroke.  Carotid Dopplers and echocardiogram showed nothing acute.  Counseled him extensively to quit smoking.   Patient will be discharged home with aspirin, Lipitor.  Follow-up with primary care physician in 1 week.  Stable for discharge home  CONSULTS OBTAINED:  Treatment Team:  Leotis Pain, MD  DRUG ALLERGIES:   Allergies  Allergen Reactions  . Penicillins Anaphylaxis    Has patient had a PCN reaction causing immediate rash, facial/tongue/throat swelling, SOB or lightheadedness with hypotension: Yes Has patient had a PCN reaction causing severe rash involving mucus membranes or skin necrosis: No Has patient had a PCN reaction that required hospitalization Yes Has patient had a PCN reaction occurring within the last 10 years: No If all of the above answers are "NO", then may proceed with Cephalosporin use.  Marland Kitchen Morphine And Related Itching    DISCHARGE MEDICATIONS:   Allergies as of 08/23/2017      Reactions   Penicillins Anaphylaxis   Has patient had a PCN reaction causing immediate rash, facial/tongue/throat swelling, SOB or lightheadedness with hypotension: Yes Has patient had a PCN reaction causing severe rash involving mucus membranes or skin necrosis: No Has patient had a PCN reaction that required hospitalization Yes Has patient had a PCN reaction occurring within the last 10 years: No If all of the above answers are "NO", then may proceed with Cephalosporin use.   Morphine And Related Itching      Medication List    TAKE these medications   albuterol 108 (90 Base) MCG/ACT inhaler Commonly known as:  PROVENTIL HFA;VENTOLIN HFA Inhale 2 puffs into the lungs every 6 (six) hours  as needed for wheezing or shortness of breath.   amLODipine 5 MG tablet Commonly known as:  NORVASC Take 1 tablet (5 mg total) by mouth daily.   aspirin 81 MG EC tablet Take 1 tablet (81 mg total) by mouth daily.   atorvastatin 10 MG tablet Commonly known as:  LIPITOR Take 1 tablet (10 mg total) by mouth daily.   omeprazole 20 MG capsule Commonly known as:  PRILOSEC Take 20 mg by mouth  daily.      Today   VITAL SIGNS:  Blood pressure (!) 145/96, pulse 82, temperature 98.6 F (37 C), temperature source Oral, resp. rate 16, height 5\' 11"  (1.803 m), weight 96.3 kg (212 lb 4.9 oz), SpO2 97 %.  I/O:  No intake or output data in the 24 hours ending 08/29/17 1124  PHYSICAL EXAMINATION:  Physical Exam  GENERAL:  62 y.o.-year-old patient lying in the bed with no acute distress.  LUNGS: Normal breath sounds bilaterally, no wheezing, rales,rhonchi or crepitation. No use of accessory muscles of respiration.  CARDIOVASCULAR: S1, S2 normal. No murmurs, rubs, or gallops.  ABDOMEN: Soft, non-tender, non-distended. Bowel sounds present. No organomegaly or mass.  NEUROLOGIC: Moves all 4 extremities. PSYCHIATRIC: The patient is alert and oriented x 3.  SKIN: No obvious rash, lesion, or ulcer.   DATA REVIEW:   CBC Recent Labs  Lab 08/22/17 1533  WBC 3.9  HGB 12.9*  HCT 38.7*  PLT 174    Chemistries  Recent Labs  Lab 08/22/17 1533 08/22/17 1552 08/23/17 0333  NA 141  --   --   K 3.3*  --  3.9  CL 108  --   --   CO2 20*  --   --   GLUCOSE 112*  --   --   BUN 11  --   --   CREATININE 0.75  --   --   CALCIUM 8.6*  --   --   MG  --  1.6*  --   AST 26  --   --   ALT 19  --   --   ALKPHOS 43  --   --   BILITOT 0.7  --   --     Cardiac Enzymes No results for input(s): TROPONINI in the last 168 hours.  Microbiology Results  Results for orders placed or performed during the hospital encounter of 05/03/17  Gastrointestinal Panel by PCR , Stool     Status: None   Collection Time: 05/03/17  4:50 AM  Result Value Ref Range Status   Campylobacter species NOT DETECTED NOT DETECTED Final   Plesimonas shigelloides NOT DETECTED NOT DETECTED Final   Salmonella species NOT DETECTED NOT DETECTED Final   Yersinia enterocolitica NOT DETECTED NOT DETECTED Final   Vibrio species NOT DETECTED NOT DETECTED Final   Vibrio cholerae NOT DETECTED NOT DETECTED Final    Enteroaggregative E coli (EAEC) NOT DETECTED NOT DETECTED Final   Enteropathogenic E coli (EPEC) NOT DETECTED NOT DETECTED Final   Enterotoxigenic E coli (ETEC) NOT DETECTED NOT DETECTED Final   Shiga like toxin producing E coli (STEC) NOT DETECTED NOT DETECTED Final   Shigella/Enteroinvasive E coli (EIEC) NOT DETECTED NOT DETECTED Final   Cryptosporidium NOT DETECTED NOT DETECTED Final   Cyclospora cayetanensis NOT DETECTED NOT DETECTED Final   Entamoeba histolytica NOT DETECTED NOT DETECTED Final   Giardia lamblia NOT DETECTED NOT DETECTED Final   Adenovirus F40/41 NOT DETECTED NOT DETECTED Final   Astrovirus NOT DETECTED NOT DETECTED  Final   Norovirus GI/GII NOT DETECTED NOT DETECTED Final   Rotavirus A NOT DETECTED NOT DETECTED Final   Sapovirus (I, II, IV, and V) NOT DETECTED NOT DETECTED Final  C difficile quick scan w PCR reflex     Status: None   Collection Time: 05/03/17  4:50 AM  Result Value Ref Range Status   C Diff antigen NEGATIVE NEGATIVE Final   C Diff toxin NEGATIVE NEGATIVE Final   C Diff interpretation No C. difficile detected.  Final    RADIOLOGY:  No results found.  Follow up with PCP in 1 week.  Management plans discussed with the patient, family and they are in agreement.  CODE STATUS:  Code Status History    Date Active Date Inactive Code Status Order ID Comments User Context   08/22/2017 18:45 08/23/2017 16:39 Full Code [id number]  Henreitta Leber, MD Inpatient   10/16/2014 07:13 10/18/2014 15:12 Full Code [id number]  Ivor Costa, MD Inpatient     TOTAL TIME TAKING CARE OF THIS PATIENT ON DAY OF DISCHARGE: more than 30 minutes.   Leia Alf Moet Mikulski M.D on 08/29/2017 at 11:24 AM  Between 7am to 6pm - Pager - [phone]  After 6pm go to [email or web address] - password [email or web address] [facility]  Office  [phone]  CC: Primary care physician; Patient, No Pcp Per  Note: This dictation was prepared with Dragon dictation along with smaller phrase  technology. Any transcriptional errors that result from this process are unintentional.

## 2018-03-14 ENCOUNTER — Other Ambulatory Visit: Payer: Self-pay

## 2018-03-14 ENCOUNTER — Encounter: Payer: Self-pay | Admitting: Emergency Medicine

## 2018-03-14 ENCOUNTER — Emergency Department: Payer: BLUE CROSS/BLUE SHIELD

## 2018-03-14 ENCOUNTER — Emergency Department
Admission: EM | Admit: 2018-03-14 | Discharge: 2018-03-14 | Disposition: A | Discharge status: Home / Self Care | Payer: BLUE CROSS/BLUE SHIELD | Attending: Emergency Medicine | Admitting: Emergency Medicine

## 2018-03-14 DIAGNOSIS — S46901A Unspecified injury of unspecified muscle, fascia and tendon at shoulder and upper arm level, right arm, initial encounter: Secondary | ICD-10-CM | POA: Diagnosis present

## 2018-03-14 DIAGNOSIS — X500XXA Overexertion from strenuous movement or load, initial encounter: Secondary | ICD-10-CM | POA: Insufficient documentation

## 2018-03-14 DIAGNOSIS — S46209A Unspecified injury of muscle, fascia and tendon of other parts of biceps, unspecified arm, initial encounter: Secondary | ICD-10-CM

## 2018-03-14 DIAGNOSIS — I10 Essential (primary) hypertension: Secondary | ICD-10-CM | POA: Insufficient documentation

## 2018-03-14 DIAGNOSIS — Y929 Unspecified place or not applicable: Secondary | ICD-10-CM | POA: Diagnosis not present

## 2018-03-14 DIAGNOSIS — S46201A Unspecified injury of muscle, fascia and tendon of other parts of biceps, right arm, initial encounter: Secondary | ICD-10-CM | POA: Diagnosis not present

## 2018-03-14 DIAGNOSIS — Z8673 Personal history of transient ischemic attack (TIA), and cerebral infarction without residual deficits: Secondary | ICD-10-CM | POA: Diagnosis not present

## 2018-03-14 DIAGNOSIS — Z79899 Other long term (current) drug therapy: Secondary | ICD-10-CM | POA: Diagnosis not present

## 2018-03-14 DIAGNOSIS — Z7982 Long term (current) use of aspirin: Secondary | ICD-10-CM | POA: Insufficient documentation

## 2018-03-14 DIAGNOSIS — Y990 Civilian activity done for income or pay: Secondary | ICD-10-CM | POA: Insufficient documentation

## 2018-03-14 DIAGNOSIS — Y9389 Activity, other specified: Secondary | ICD-10-CM | POA: Diagnosis not present

## 2018-03-14 DIAGNOSIS — F17210 Nicotine dependence, cigarettes, uncomplicated: Secondary | ICD-10-CM | POA: Insufficient documentation

## 2018-03-14 MED ORDER — MELOXICAM 15 MG PO TABS: 15.0000 mg | ORAL_TABLET | Freq: Every day | ORAL | 0 refills | Status: DC

## 2018-03-14 MED ORDER — MELOXICAM 7.5 MG PO TABS
15.0000 mg | ORAL_TABLET | Freq: Once | ORAL | Status: AC
  Administered 2018-03-14: 15 mg via ORAL
  Filled 2018-03-14: qty 2

## 2018-03-14 MED ORDER — CYCLOBENZAPRINE HCL 5 MG PO TABS: 5.0000 mg | ORAL_TABLET | Freq: Every day | ORAL | 0 refills | Status: DC

## 2018-03-14 NOTE — ED Notes (Signed)
NAD noted at time of D/C. Pt denies questions or concerns. Pt ambulatory to the lobby at this time.  

## 2018-03-14 NOTE — Discharge Instructions (Signed)
Your exam is consistent with

## 2018-03-14 NOTE — ED Triage Notes (Signed)
Pt was moving something and started right right elbow pain. Worse with movement. No deformity

## 2018-03-17 NOTE — ED Provider Notes (Signed)
[facility] Emergency Department Provider Note ____________________________________________  Time seen: 1145  I have reviewed the triage vital signs and the nursing notes.  HISTORY  Chief Complaint  Arm Pain  HPI [name] is a 62 y.o. right handed male who presents himself to the ED for evaluation of sudden right arm pain and disability.  Patient works at a [employer], and describes putting together an adjustable mattress space when the injury occurred.  He describes hyperextending his right arm, and having immediate pain sensation to the medial elbow.  Since that time he has had pain and disability with attempts to fully extend at the elbow.  Denies any distal paresthesias, grip changes, or shoulder pain.  He also denies any chest pain or shortness of breath.  He denies any history of previous or chronic upper extremity orthopedic complaints.  He presents now for further evaluation.  Past Medical History:  Diagnosis Date  . Acid reflux   . Alcohol abuse   . Arthritis    "left pinky" (10/17/2014)  . DDD (degenerative disc disease), lumbar   . Gastroenteritis   . History of stress test 2001   no ischemia  . Hypertension   . Tobacco abuse   . Vertebral compression fracture (HCC)    T11, L1    Patient Active Problem List   Diagnosis Date Noted  . TIA (transient ischemic attack) 08/22/2017  . Bilateral recurrent inguinal hernia without obstruction or gangrene   . Influenza 10/18/2014  . Hypoxia 10/16/2014  . SOB (shortness of breath) 10/16/2014  . Cough 10/16/2014  . Fever 10/16/2014  . Sepsis ([place]) 10/16/2014  . Nausea and vomiting 10/16/2014  . Acid reflux   . DDD (degenerative disc disease), lumbar   . Hypertension   . Tobacco abuse   . Alcohol abuse   . Essential hypertension     Past Surgical History:  Procedure Laterality Date  . COLON SURGERY    . EXPLORATORY LAPAROTOMY W/ BOWEL RESECTION  1980's    Prior to  Admission medications   Medication Sig Start Date End Date Taking? Authorizing Provider  albuterol (PROVENTIL HFA;VENTOLIN HFA) 108 (90 Base) MCG/ACT inhaler Inhale 2 puffs into the lungs every 6 (six) hours as needed for wheezing or shortness of breath. 07/12/17   Johnn Hai, [name]  amLODipine (NORVASC) 5 MG tablet Take 1 tablet (5 mg total) by mouth daily. 08/23/17 08/23/18  Hillary Bow, MD  aspirin EC 81 MG EC tablet Take 1 tablet (81 mg total) by mouth daily. 08/24/17   Hillary Bow, MD  atorvastatin (LIPITOR) 10 MG tablet Take 1 tablet (10 mg total) by mouth daily. 08/23/17 08/23/18  Hillary Bow, MD  cyclobenzaprine (FLEXERIL) 5 MG tablet Take 1 tablet (5 mg total) by mouth at bedtime. 03/14/18   Alveria Mcglaughlin, Dannielle Karvonen, [name]  meloxicam (MOBIC) 15 MG tablet Take 1 tablet (15 mg total) by mouth daily. 03/14/18 04/13/18  Claudette Wermuth, Dannielle Karvonen, [name]  omeprazole (PRILOSEC) 20 MG capsule Take 20 mg by mouth daily.    [provider]    Allergies Penicillins and Morphine and related  Family History  Problem Relation Age of Onset  . Diabetes Mother   . Hypertension Mother   . Stroke Father   . Heart failure Father   . Heart failure Other     Social History Social History   Tobacco Use  . Smoking status: Current Every Day Smoker    Packs/day: 0.50  Years: 27.00    Pack years: 13.50    Types: Cigarettes  . Smokeless tobacco: Never Used  Substance Use Topics  . Alcohol use: Yes    Alcohol/week: 64.0 standard drinks    Types: 15 Cans of beer, 44 Shots of liquor, 5 Standard drinks or equivalent per week    Comment: 4/11/23016 "I drink none to 1 pint of gin/day; 40oz beer when I'm drinking; occasional wine; probably 4 pints/wk; 4, 40oz beers/wk"  . Drug use: No    Types: Marijuana    Comment: 10/17/2014 "last marijuana was in the 1990's"    Review of Systems  Constitutional: Negative for fever. Cardiovascular: Negative for chest pain. Respiratory: Negative for  shortness of breath. Musculoskeletal: Negative for back pain.  Right elbow pain and disability as above. Skin: Negative for rash. Neurological: Negative for headaches, focal weakness or numbness. ____________________________________________  PHYSICAL EXAM:  VITAL SIGNS: ED Triage Vitals  Enc Vitals Group     BP 03/14/18 1035 (!) 182/100     Pulse Rate 03/14/18 1035 100     Resp 03/14/18 1035 18     Temp 03/14/18 1035 98.7 F (37.1 C)     Temp Source 03/14/18 1035 Oral     SpO2 03/14/18 1035 98 %     Weight 03/14/18 1036 220 lb (99.8 kg)     Height 03/14/18 1036 5\' 11"  (1.803 m)     Head Circumference --      Peak Flow --      Pain Score 03/14/18 1036 8     Pain Loc --      Pain Edu? --      Excl. in [place]? --     Constitutional: Alert and oriented. Well appearing and in no distress. Head: Normocephalic and atraumatic. Cardiovascular: Normal rate, regular rhythm. Normal distal pulses. Respiratory: Normal respiratory effort. No wheezes/rales/rhonchi. Musculoskeletal: Right upper extremity without any obvious deformity, dislocation, or effusion noted to the hand, elbow, or shoulder.  Patient with normal composite grips bilaterally.  He has decreased extension range at the elbow with the hand in the supine position.  He is able to demonstrate normal elbow flexion range but supination is slow and deliberate.  Patient with tenderness to palpation to the medial elbow.  No tenderness to the lateral or medial epicondyles.  Patient with a positive Yergason's on the right.  No bicep muscle deformity is noted on gross exam.  Decreased resistance testing of the biceps tendon secondary to pain.  No rotator cuff deficits are appreciated.  Nontender with normal range of motion in all other extremities.  Neurologic:  Normal sensation.  Normal intrinsic and opposition testing.  Normal UE DTRs bilaterally.  Normal speech and language. No gross focal neurologic deficits are appreciated. Skin:  Skin is  warm, dry and intact. No rash noted. Psychiatric: Mood and affect are normal. Patient exhibits appropriate insight and judgment. ____________________________________________   RADIOLOGY  Right elbow Negative ____________________________________________  PROCEDURES  Procedures Meloxicam 15 mg p.o. Arm sling ____________________________________________  INITIAL IMPRESSION / ASSESSMENT AND PLAN / ED COURSE  Patient with ED evaluation of acute injury to the right biceps tendon.  Patient's exam is consistent with a likely partial biceps tendon tear at the origin in the elbow.  He has a positive Yergason's on presentation.  Patient is placed in a arm sling for comfort.  He is referred to orthopedics for further evaluation and definitive treatment.  He is discharged with a prescription for meloxicam to dose daily  for anti-inflammatory benefit.  He will return to work with limited right hand use secondary to sling. ____________________________________________  FINAL CLINICAL IMPRESSION(S) / ED DIAGNOSES  Final diagnoses:  Injury of tendon of biceps      Arnitra Sokoloski, Dannielle Karvonen, [name] 03/17/18 [place], [place], MD 03/17/18 [phone]

## 2018-04-08 ENCOUNTER — Emergency Department: Payer: BLUE CROSS/BLUE SHIELD

## 2018-04-08 ENCOUNTER — Observation Stay
Admission: EM | Admit: 2018-04-08 | Discharge: 2018-04-09 | DRG: 069 | Disposition: A | Discharge status: Home / Self Care | Payer: BLUE CROSS/BLUE SHIELD | Attending: Internal Medicine | Admitting: Internal Medicine

## 2018-04-08 ENCOUNTER — Other Ambulatory Visit: Payer: Self-pay

## 2018-04-08 ENCOUNTER — Inpatient Hospital Stay: Payer: BLUE CROSS/BLUE SHIELD

## 2018-04-08 ENCOUNTER — Inpatient Hospital Stay (HOSPITAL_COMMUNITY)
Admit: 2018-04-08 | Discharge: 2018-04-08 | Disposition: A | Discharge status: Home / Self Care | Payer: BLUE CROSS/BLUE SHIELD | Attending: Internal Medicine | Admitting: Internal Medicine

## 2018-04-08 DIAGNOSIS — E876 Hypokalemia: Secondary | ICD-10-CM | POA: Diagnosis not present

## 2018-04-08 DIAGNOSIS — H538 Other visual disturbances: Secondary | ICD-10-CM | POA: Diagnosis present

## 2018-04-08 DIAGNOSIS — R29810 Facial weakness: Secondary | ICD-10-CM | POA: Diagnosis present

## 2018-04-08 DIAGNOSIS — Z823 Family history of stroke: Secondary | ICD-10-CM

## 2018-04-08 DIAGNOSIS — M19042 Primary osteoarthritis, left hand: Secondary | ICD-10-CM | POA: Diagnosis not present

## 2018-04-08 DIAGNOSIS — F17210 Nicotine dependence, cigarettes, uncomplicated: Secondary | ICD-10-CM | POA: Diagnosis not present

## 2018-04-08 DIAGNOSIS — Z23 Encounter for immunization: Secondary | ICD-10-CM

## 2018-04-08 DIAGNOSIS — G459 Transient cerebral ischemic attack, unspecified: Secondary | ICD-10-CM | POA: Diagnosis not present

## 2018-04-08 DIAGNOSIS — R4781 Slurred speech: Secondary | ICD-10-CM | POA: Diagnosis not present

## 2018-04-08 DIAGNOSIS — Z8249 Family history of ischemic heart disease and other diseases of the circulatory system: Secondary | ICD-10-CM | POA: Diagnosis not present

## 2018-04-08 DIAGNOSIS — Z716 Tobacco abuse counseling: Secondary | ICD-10-CM

## 2018-04-08 DIAGNOSIS — R270 Ataxia, unspecified: Secondary | ICD-10-CM | POA: Diagnosis present

## 2018-04-08 DIAGNOSIS — M5136 Other intervertebral disc degeneration, lumbar region: Secondary | ICD-10-CM | POA: Diagnosis not present

## 2018-04-08 DIAGNOSIS — E785 Hyperlipidemia, unspecified: Secondary | ICD-10-CM | POA: Diagnosis not present

## 2018-04-08 DIAGNOSIS — I639 Cerebral infarction, unspecified: Secondary | ICD-10-CM | POA: Diagnosis present

## 2018-04-08 DIAGNOSIS — Z8673 Personal history of transient ischemic attack (TIA), and cerebral infarction without residual deficits: Secondary | ICD-10-CM | POA: Diagnosis not present

## 2018-04-08 DIAGNOSIS — Z885 Allergy status to narcotic agent status: Secondary | ICD-10-CM | POA: Diagnosis not present

## 2018-04-08 DIAGNOSIS — I5030 Unspecified diastolic (congestive) heart failure: Secondary | ICD-10-CM | POA: Diagnosis not present

## 2018-04-08 DIAGNOSIS — K219 Gastro-esophageal reflux disease without esophagitis: Secondary | ICD-10-CM | POA: Diagnosis not present

## 2018-04-08 DIAGNOSIS — R29702 NIHSS score 2: Secondary | ICD-10-CM | POA: Diagnosis not present

## 2018-04-08 DIAGNOSIS — I10 Essential (primary) hypertension: Secondary | ICD-10-CM | POA: Diagnosis present

## 2018-04-08 DIAGNOSIS — Z880 Allergy status to penicillin: Secondary | ICD-10-CM | POA: Diagnosis not present

## 2018-04-08 DIAGNOSIS — Z9114 Patient's other noncompliance with medication regimen: Secondary | ICD-10-CM | POA: Diagnosis not present

## 2018-04-08 DIAGNOSIS — R0602 Shortness of breath: Secondary | ICD-10-CM

## 2018-04-08 DIAGNOSIS — Z79899 Other long term (current) drug therapy: Secondary | ICD-10-CM

## 2018-04-08 DIAGNOSIS — I444 Left anterior fascicular block: Secondary | ICD-10-CM | POA: Diagnosis present

## 2018-04-08 DIAGNOSIS — Z833 Family history of diabetes mellitus: Secondary | ICD-10-CM

## 2018-04-08 DIAGNOSIS — Q211 Atrial septal defect: Secondary | ICD-10-CM

## 2018-04-08 DIAGNOSIS — Q2112 Patent foramen ovale: Secondary | ICD-10-CM

## 2018-04-08 DIAGNOSIS — R062 Wheezing: Secondary | ICD-10-CM

## 2018-04-08 LAB — DIFFERENTIAL
Basophils Absolute: 0.1 10*3/uL (ref 0–0.1)
Basophils Relative: 1 %
Eosinophils Absolute: 5.7 10*3/uL — ABNORMAL HIGH (ref 0–0.7)
Eosinophils Relative: 51 %
Lymphocytes Relative: 22 %
Lymphs Abs: 2.5 10*3/uL (ref 1.0–3.6)
Monocytes Absolute: 0.7 10*3/uL (ref 0.2–1.0)
Monocytes Relative: 6 %
Neutro Abs: 2.2 10*3/uL (ref 1.4–6.5)
Neutrophils Relative %: 20 %

## 2018-04-08 LAB — URINE DRUG SCREEN, QUALITATIVE (ARMC ONLY)
Amphetamines, Ur Screen: NOT DETECTED
Barbiturates, Ur Screen: NOT DETECTED
Benzodiazepine, Ur Scrn: NOT DETECTED
Cannabinoid 50 Ng, Ur ~~LOC~~: NOT DETECTED
Cocaine Metabolite,Ur ~~LOC~~: NOT DETECTED
MDMA (Ecstasy)Ur Screen: NOT DETECTED
Methadone Scn, Ur: NOT DETECTED
Opiate, Ur Screen: NOT DETECTED
Phencyclidine (PCP) Ur S: NOT DETECTED
Tricyclic, Ur Screen: NOT DETECTED

## 2018-04-08 LAB — COMPREHENSIVE METABOLIC PANEL
ALT: 13 U/L (ref 0–44)
AST: 21 U/L (ref 15–41)
Albumin: 4.4 g/dL (ref 3.5–5.0)
Alkaline Phosphatase: 48 U/L (ref 38–126)
Anion gap: 9 (ref 5–15)
BUN: 8 mg/dL (ref 8–23)
CO2: 27 mmol/L (ref 22–32)
Calcium: 8.4 mg/dL — ABNORMAL LOW (ref 8.9–10.3)
Chloride: 104 mmol/L (ref 98–111)
Creatinine, Ser: 0.9 mg/dL (ref 0.61–1.24)
GFR calc Af Amer: >60 mL/min (ref >60)
GFR calc non Af Amer: >60 mL/min (ref >60)
Glucose, Bld: 129 mg/dL — ABNORMAL HIGH (ref 70–99)
Potassium: 3.3 mmol/L — ABNORMAL LOW (ref 3.5–5.1)
Sodium: 140 mmol/L (ref 135–145)
Total Bilirubin: 1 mg/dL (ref 0.3–1.2)
Total Protein: 7.4 g/dL (ref 6.5–8.1)

## 2018-04-08 LAB — TROPONIN I: Troponin I: <0.03 ng/mL (ref <0.03)

## 2018-04-08 LAB — LIPID PANEL
CHOLESTEROL: 173 mg/dL (ref 0–200)
HDL: 66 mg/dL (ref >40)
LDL CALC: 92 mg/dL (ref 0–99)
Total CHOL/HDL Ratio: 2.6 RATIO
Triglycerides: 74 mg/dL (ref <150)
VLDL: 15 mg/dL (ref 0–40)

## 2018-04-08 LAB — CBC
HCT: 40.6 % (ref 40.0–52.0)
HEMOGLOBIN: 13.7 g/dL (ref 13.0–18.0)
MCH: 30.6 pg (ref 26.0–34.0)
MCHC: 33.8 g/dL (ref 32.0–36.0)
MCV: 90.5 fL (ref 80.0–100.0)
PLATELETS: 210 10*3/uL (ref 150–440)
RBC: 4.49 MIL/uL (ref 4.40–5.90)
RDW: 17 % — ABNORMAL HIGH (ref 11.5–14.5)
WBC: 11.2 10*3/uL — ABNORMAL HIGH (ref 3.8–10.6)

## 2018-04-08 LAB — ETHANOL: Alcohol, Ethyl (B): <10 mg/dL (ref <10)

## 2018-04-08 LAB — GLUCOSE, CAPILLARY: GLUCOSE-CAPILLARY: 75 mg/dL (ref 70–99)

## 2018-04-08 LAB — APTT: APTT: 28 s (ref 24–36)

## 2018-04-08 LAB — PROTIME-INR
INR: 1
Prothrombin Time: 13.1 seconds (ref 11.4–15.2)

## 2018-04-08 MED ORDER — ACETAMINOPHEN 650 MG RE SUPP: 650.0000 mg | RECTAL | Status: DC | PRN

## 2018-04-08 MED ORDER — SENNOSIDES-DOCUSATE SODIUM 8.6-50 MG PO TABS: 1.0000 | ORAL_TABLET | Freq: Every evening | ORAL | Status: DC | PRN

## 2018-04-08 MED ORDER — ONDANSETRON HCL 4 MG/2ML IJ SOLN: 4.0000 mg | Freq: Four times a day (QID) | INTRAMUSCULAR | Status: DC | PRN

## 2018-04-08 MED ORDER — ACETAMINOPHEN 160 MG/5ML PO SOLN
650.0000 mg | ORAL | Status: DC | PRN
  Filled 2018-04-08: qty 20.3

## 2018-04-08 MED ORDER — ASPIRIN 81 MG PO CHEW
324.0000 mg | CHEWABLE_TABLET | Freq: Once | ORAL | Status: AC
  Administered 2018-04-08: 324 mg via ORAL
  Filled 2018-04-08: qty 4

## 2018-04-08 MED ORDER — PANTOPRAZOLE SODIUM 40 MG PO TBEC
40.0000 mg | DELAYED_RELEASE_TABLET | Freq: Every day | ORAL | Status: DC
  Administered 2018-04-09: 40 mg via ORAL
  Filled 2018-04-08: qty 1

## 2018-04-08 MED ORDER — ZOLPIDEM TARTRATE 5 MG PO TABS
10.0000 mg | ORAL_TABLET | Freq: Every evening | ORAL | Status: DC | PRN
  Filled 2018-04-08: qty 1

## 2018-04-08 MED ORDER — HYDRALAZINE HCL 20 MG/ML IJ SOLN: 10.0000 mg | Freq: Four times a day (QID) | INTRAMUSCULAR | Status: DC | PRN

## 2018-04-08 MED ORDER — ATORVASTATIN CALCIUM 20 MG PO TABS
40.0000 mg | ORAL_TABLET | Freq: Every day | ORAL | Status: DC
  Administered 2018-04-08: 22:00:00 40 mg via ORAL
  Filled 2018-04-08: qty 2

## 2018-04-08 MED ORDER — ENOXAPARIN SODIUM 40 MG/0.4ML ~~LOC~~ SOLN
40.0000 mg | SUBCUTANEOUS | Status: DC
  Administered 2018-04-08: 40 mg via SUBCUTANEOUS
  Filled 2018-04-08: qty 0.4

## 2018-04-08 MED ORDER — STROKE: EARLY STAGES OF RECOVERY BOOK
Freq: Once | Status: AC
  Administered 2018-04-08: 13:00:00

## 2018-04-08 MED ORDER — INFLUENZA VAC SPLIT QUAD 0.5 ML IM SUSY
0.5000 mL | PREFILLED_SYRINGE | INTRAMUSCULAR | Status: AC
  Administered 2018-04-09: 0.5 mL via INTRAMUSCULAR
  Filled 2018-04-08: qty 0.5

## 2018-04-08 MED ORDER — ACETAMINOPHEN 325 MG PO TABS: 650.0000 mg | ORAL_TABLET | ORAL | Status: DC | PRN

## 2018-04-08 MED ORDER — ASPIRIN 325 MG PO TABS
325.0000 mg | ORAL_TABLET | Freq: Every day | ORAL | Status: DC
  Administered 2018-04-09: 325 mg via ORAL
  Filled 2018-04-08 (×2): qty 1

## 2018-04-08 NOTE — ED Notes (Signed)
MRI came and transported pt to MRI at this time

## 2018-04-08 NOTE — Progress Notes (Signed)
PT Cancellation Note  Patient Details Name: Leam Madero MRN: [id number] DOB: 1956-06-22   Cancelled Treatment:    Reason Eval/Treat Not Completed: Other (comment).  PT consult received.  Chart reviewed.  Upon arrival to pt's room, pt not available for therapy (ECHO being performed).  Will re-attempt PT evaluation at a later date/time.  Leitha Bleak, PT 04/08/18, 2:59 PM [phone]

## 2018-04-08 NOTE — H&P (Signed)
[facility] [place] NAME: [name]    MR#:  [id number]  DATE OF BIRTH:  01/19/1956  DATE OF ADMISSION:  04/08/2018  PRIMARY CARE PHYSICIAN: Patient, No Pcp Per   REQUESTING/REFERRING PHYSICIAN: Dr. Jimmye Norman  CHIEF COMPLAINT:   Chief Complaint  Patient presents with  . Dizziness  . Other    Balanace Issues    HISTORY OF PRESENT ILLNESS:  [name]  is a 62 y.o. male with a known history of TIA, hypertension, tobacco use, noncompliant with medications presents to the emergency room complaining of waking up at 6 AM with balance problems, right facial droop, right-sided blurred vision and slurred speech.  Patient presented to the emergency room as and was found to be outside TPA window.  CT scan of the head showed nothing acute.  Patient being admitted for further work-up and treatment of acute CVA. Patient was admitted in February 2019 for TIA with vision problems which had resolved completely.  He was started on aspirin and Lipitor.  Patient has not been taking any of his medications other than Protonix for GERD.  Drinks 3 alcohol drinks a day daily.  PAST MEDICAL HISTORY:   Past Medical History:  Diagnosis Date  . Acid reflux   . Alcohol abuse   . Arthritis    "left pinky" (10/17/2014)  . DDD (degenerative disc disease), lumbar   . Gastroenteritis   . History of stress test 2001   no ischemia  . Hypertension   . Tobacco abuse   . Vertebral compression fracture (HCC)    T11, L1    PAST SURGICAL HISTORY:   Past Surgical History:  Procedure Laterality Date  . COLON SURGERY    . EXPLORATORY LAPAROTOMY W/ BOWEL RESECTION  1980's    SOCIAL HISTORY:   Social History   Tobacco Use  . Smoking status: Current Every Day Smoker    Packs/day: 0.50    Years: 27.00    Pack years: 13.50    Types: Cigarettes  . Smokeless tobacco: Never Used  Substance Use Topics  . Alcohol use: Yes    Alcohol/week: 64.0 standard drinks     Types: 15 Cans of beer, 44 Shots of liquor, 5 Standard drinks or equivalent per week    Comment: 4/11/23016 "I drink none to 1 pint of gin/day; 40oz beer when I'm drinking; occasional wine; probably 4 pints/wk; 4, 40oz beers/wk"    FAMILY HISTORY:   Family History  Problem Relation Age of Onset  . Diabetes Mother   . Hypertension Mother   . Stroke Father   . Heart failure Father   . Heart failure Other     DRUG ALLERGIES:   Allergies  Allergen Reactions  . Penicillins Anaphylaxis    Has patient had a PCN reaction causing immediate rash, facial/tongue/throat swelling, SOB or lightheadedness with hypotension: Yes Has patient had a PCN reaction causing severe rash involving mucus membranes or skin necrosis: No Has patient had a PCN reaction that required hospitalization Yes Has patient had a PCN reaction occurring within the last 10 years: No If all of the above answers are "NO", then may proceed with Cephalosporin use.  Marland Kitchen Morphine And Related Itching    REVIEW OF SYSTEMS:   Review of Systems  Constitutional: Positive for malaise/fatigue. Negative for chills, fever and weight loss.  HENT: Negative for hearing loss and nosebleeds.   Eyes: Negative for blurred vision, double vision and pain.  Respiratory: Negative for cough,  hemoptysis, sputum production, shortness of breath and wheezing.   Cardiovascular: Negative for chest pain, palpitations, orthopnea and leg swelling.  Gastrointestinal: Negative for abdominal pain, constipation, diarrhea, nausea and vomiting.  Genitourinary: Negative for dysuria and hematuria.  Musculoskeletal: Negative for back pain, falls and myalgias.  Skin: Negative for rash.  Neurological: Positive for sensory change and speech change. Negative for dizziness, tremors, focal weakness, seizures and headaches.  Endo/Heme/Allergies: Does not bruise/bleed easily.  Psychiatric/Behavioral: Negative for depression and memory loss. The patient is not  nervous/anxious.     MEDICATIONS AT HOME:   Prior to Admission medications   Medication Sig Start Date End Date Taking? Authorizing Provider  albuterol (PROVENTIL HFA;VENTOLIN HFA) 108 (90 Base) MCG/ACT inhaler Inhale 2 puffs into the lungs every 6 (six) hours as needed for wheezing or shortness of breath. 07/12/17  Yes Johnn Hai, [name]  omeprazole (PRILOSEC) 20 MG capsule Take 20 mg by mouth daily.   Yes [provider]  amLODipine (NORVASC) 5 MG tablet Take 1 tablet (5 mg total) by mouth daily. Patient not taking: Reported on 04/08/2018 08/23/17 08/23/18  Hillary Bow, MD  aspirin EC 81 MG EC tablet Take 1 tablet (81 mg total) by mouth daily. Patient not taking: Reported on 04/08/2018 08/24/17   Hillary Bow, MD  atorvastatin (LIPITOR) 10 MG tablet Take 1 tablet (10 mg total) by mouth daily. Patient not taking: Reported on 04/08/2018 08/23/17 08/23/18  Hillary Bow, MD  cyclobenzaprine (FLEXERIL) 5 MG tablet Take 1 tablet (5 mg total) by mouth at bedtime. Patient not taking: Reported on 04/08/2018 03/14/18   Menshew, Dannielle Karvonen, [name]  meloxicam (MOBIC) 15 MG tablet Take 1 tablet (15 mg total) by mouth daily. Patient not taking: Reported on 04/08/2018 03/14/18 04/13/18  Menshew, Dannielle Karvonen, [name]     VITAL SIGNS:  Blood pressure (!) 163/90, pulse 76, temperature 98.3 F (36.8 C), temperature source Oral, resp. rate 19, height 5\' 11"  (1.803 m), weight 98.4 kg, SpO2 100 %.  PHYSICAL EXAMINATION:  Physical Exam  GENERAL:  62 y.o.-year-old patient lying in the bed with no acute distress.  EYES: Pupils equal, round, reactive to light and accommodation. No scleral icterus. Extraocular muscles intact.  HEENT: Head atraumatic, normocephalic. Oropharynx and nasopharynx clear. No oropharyngeal erythema, moist oral mucosa  NECK:  Supple, no jugular venous distention. No thyroid enlargement, no tenderness.  LUNGS: Normal breath sounds bilaterally, no wheezing, rales, rhonchi. No use  of accessory muscles of respiration.  CARDIOVASCULAR: S1, S2 normal. No murmurs, rubs, or gallops.  ABDOMEN: Soft, nontender, nondistended. Bowel sounds present. No organomegaly or mass.  EXTREMITIES: No pedal edema, cyanosis, or clubbing. + 2 pedal & radial pulses b/l.   NEUROLOGIC: Cranial nerves II through XII are intact.  Motor strength 5/5 in upper and lower extremities.  Finger-nose and knee heel test normal.  Gait not checked. Decreased sensations on right side of the face.  Right facial droop PSYCHIATRIC: The patient is alert and oriented x 3. Good affect.  SKIN: No obvious rash, lesion, or ulcer.   LABORATORY PANEL:   CBC Recent Labs  Lab 04/08/18 0813  WBC 11.2*  HGB 13.7  HCT 40.6  PLT 210   ------------------------------------------------------------------------------------------------------------------  Chemistries  Recent Labs  Lab 04/08/18 0813  NA 140  K 3.3*  CL 104  CO2 27  GLUCOSE 129*  BUN 8  CREATININE 0.90  CALCIUM 8.4*  AST 21  ALT 13  ALKPHOS 48  BILITOT 1.0   ------------------------------------------------------------------------------------------------------------------  Cardiac Enzymes Recent Labs  Lab 04/08/18 0813  TROPONINI <0.03   ------------------------------------------------------------------------------------------------------------------  RADIOLOGY:  Ct Head Wo Contrast  Result Date: 04/08/2018 CLINICAL DATA:  Awake in today with lightheadedness, mild right facial droop, poor balance EXAM: CT HEAD WITHOUT CONTRAST TECHNIQUE: Contiguous axial images were obtained from the base of the skull through the vertex without intravenous contrast. COMPARISON:  CT brain of 08/22/2017 FINDINGS: Brain: The ventricular system is stable in size and configuration, and the septum is midline in position. The fourth ventricle and basilar cisterns are unremarkable. No hemorrhage, mass lesion, or acute infarction is seen. Vascular: No vascular  abnormality is noted on this unenhanced study. Skull: On bone window images, no acute calvarial abnormality is seen. Sinuses/Orbits: The paranasal sinuses are clear with minimal debris layering posteriorly in the sphenoid sinus, unchanged compared to the CT brain scan from earlier this year. Other: None. IMPRESSION: Negative unenhanced CT of the brain. No acute intracranial abnormality. Electronically Signed   By: Ivar Drape M.D.   On: 04/08/2018 08:43     IMPRESSION AND PLAN:   *Acute CVA with ataxia, right facial droop vision change in right facial numbness Patient will be admitted to medical floor with telemetry monitoring.  Neurochecks.  PT OT and speech consult. Start aspirin and Lipitor.  Permissive hypertension. We will check MRI and MRA of the brain.  Echocardiogram with bubble study.  Had carotid Dopplers a few months back and will not repeat them. Consult neurology for further input.  *Hypertension.  No new medications at this time.  Blood pressure in 150s.  Permissive hypertension with stroke.  Will need to start medications at discharge.  Counseled patient regarding compliance with medications and its importance.  *Hypokalemia.  Will replace orally.  *Tobacco abuse.  Counseled patient to quit smoking.  All the records are reviewed and case discussed with ED provider. Management plans discussed with the patient, family and they are in agreement.  CODE STATUS: Full code  TOTAL TIME TAKING CARE OF THIS PATIENT: 35 minutes.   Leia Alf Nikkita Adeyemi M.D on 04/08/2018 at 10:59 AM  Between 7am to 6pm - Pager - [phone]  After 6pm go to [email or web address] - password [email or web address] [facility]  Office  [phone]  CC: Primary care physician; Patient, No Pcp Per  Note: This dictation was prepared with Dragon dictation along with smaller phrase technology. Any transcriptional errors that result from this process are unintentional.

## 2018-04-08 NOTE — ED Notes (Signed)
Pt transported to 120

## 2018-04-08 NOTE — ED Notes (Signed)
PT did not have to urinate at this time

## 2018-04-08 NOTE — ED Triage Notes (Signed)
Pt reports that upon awakening this AM he feels lightheaded, off balance and mild right sided facial droop. Pt states he went to bed at 0100 this AM and face did not have facial droop, balance was normal.   Pt drove himself to ER.  Pt alert and oriented X4, active, cooperative, pt in NAD. RR even and unlabored, color WNL.  No extremity weakness.

## 2018-04-08 NOTE — ED Notes (Signed)
MRI called and spoke with pt to ask questions prior to study

## 2018-04-08 NOTE — Consult Note (Signed)
Referring Physician:Sudini, Srikar, MD     Chief Complaint: Right facial droop and imbalance  HPI: [name] is an 62 y.o. male with pertinent history of uncontrolled high blood pressure, alcohol abuse, tobacco abuse, previous TIA presenting to the ED with complaints of right-sided facial droop and imbalance.  Patient states that he went to bed last night around 1 to 1:30 AM and woke up about 6-6:30 am with severe headache.  Patient describes the headache as sharp and predominantly located on the right side. He looked in the mirror and noticed that his right eye was drooping and face slightly drawn compared to his other face.  He notified his girlfriend who felt that his speech was slightly slurred and his balance was off.  Denies other associated symptoms of vision disturbance, dizziness, numbness or weakness in his extremities or other focal neurologic deficit.  He was previously evaluated in the ED on 08/22/2017 with strokelike symptoms in which he described loss of peripheral vision with retention of central lesion and dysarthria which resolved within 2 hours.  He was discharged home on aspirin 81 mg and to follow-up with the PCP for management of his high blood pressure.  He however admits that he never followed up with the PCP and has not been taking his aspirin or blood pressure medication.  In the ED he was noted to have elevated blood pressure of 178/102.  Initial CT head was negative for acute intracranial abnormality.  Follow-up MRI brain and MRA head was equally unremarkable with no acute finding.  Initial NIH stroke scale 2.  Due to his stroke risk factors he was admitted for further stroke work-up.                                         Date last known well: Date: 04/08/2018 Time last known well: Time: 01:30 tPA Given: No: outside window period.  Past Medical History:  Diagnosis Date  . Acid reflux   . Alcohol abuse   . Arthritis    "left pinky" (10/17/2014)  . DDD  (degenerative disc disease), lumbar   . Gastroenteritis   . History of stress test 2001   no ischemia  . Hypertension   . Tobacco abuse   . Vertebral compression fracture (HCC)    T11, L1    Past Surgical History:  Procedure Laterality Date  . COLON SURGERY    . EXPLORATORY LAPAROTOMY W/ BOWEL RESECTION  1980's    Family History  Problem Relation Age of Onset  . Diabetes Mother   . Hypertension Mother   . Stroke Father   . Heart failure Father   . Heart failure Other    Social History:  reports that he has been smoking cigarettes. He has a 13.50 pack-year smoking history. He has never used smokeless tobacco. He reports that he drinks about 64.0 standard drinks of alcohol per week. He reports that he does not use drugs.  Allergies:  Allergies  Allergen Reactions  . Penicillins Anaphylaxis    Has patient had a PCN reaction causing immediate rash, facial/tongue/throat swelling, SOB or lightheadedness with hypotension: Yes Has patient had a PCN reaction causing severe rash involving mucus membranes or skin necrosis: No Has patient had a PCN reaction that required hospitalization Yes Has patient had a PCN reaction occurring within the last 10 years: No If all of the above answers are "NO",  then may proceed with Cephalosporin use.  Marland Kitchen Morphine And Related Itching    Medications:  I have reviewed the patient's current medications. Prior to Admission:  (Not in a hospital admission) Scheduled: .  stroke: mapping our early stages of recovery book   Does not apply Once  . [START ON 04/09/2018] aspirin  325 mg Oral Daily  . atorvastatin  40 mg Oral q1800  . enoxaparin (LOVENOX) injection  40 mg Subcutaneous Q24H    ROS: History obtained from the patient   General ROS: negative for - chills, fatigue, fever, night sweats, weight gain or weight loss Psychological ROS: negative for - behavioral disorder, hallucinations, memory difficulties, mood swings or suicidal  ideation Ophthalmic ROS: negative for - blurry vision, double vision, eye pain or loss of vision ENT ROS: negative for - epistaxis, nasal discharge, oral lesions, sore throat, tinnitus or vertigo Allergy and Immunology ROS: negative for - hives or itchy/watery eyes Hematological and Lymphatic ROS: negative for - bleeding problems, bruising or swollen lymph nodes Endocrine ROS: negative for - galactorrhea, hair pattern changes, polydipsia/polyuria or temperature intolerance Respiratory ROS: negative for - cough, hemoptysis, shortness of breath or wheezing Cardiovascular ROS: negative for - chest pain, dyspnea on exertion, edema or irregular heartbeat Gastrointestinal ROS: negative for - abdominal pain, diarrhea, hematemesis, nausea/vomiting or stool incontinence Genito-Urinary ROS: negative for - dysuria, hematuria, incontinence or urinary frequency/urgency Musculoskeletal ROS: negative for - joint swelling or muscular weakness Neurological ROS: as noted in HPI Dermatological ROS: negative for rash and skin lesion changes  Physical Examination: Blood pressure (!) 153/95, pulse 81, temperature 98.3 F (36.8 C), temperature source Oral, resp. rate 18, height 5\' 11"  (1.803 m), weight 98.4 kg, SpO2 98 %.   HEENT-  Normocephalic, no lesions, without obvious abnormality.  Normal external eye and conjunctiva.  Normal TM's bilaterally.  Normal auditory canals and external ears. Normal external nose, mucus membranes and septum.  Normal pharynx. Cardiovascular- S1, S2 normal, pulses palpable throughout   Lungs- chest clear, no wheezing, rales, normal symmetric air entry Abdomen- soft, non-tender; bowel sounds normal; no masses,  no organomegaly Extremities- no edema Lymph-no adenopathy palpable Musculoskeletal-no joint tenderness, deformity or swelling Skin-warm and dry, no hyperpigmentation, vitiligo, or suspicious lesions  Neurological Exam   Mental Status: Alert, oriented, thought content  appropriate.  Speech fluent without evidence of aphasia.  Able to follow 3 step commands without difficulty. Attention span and concentration seemed appropriate  Cranial Nerves: II: Discs flat bilaterally; Visual fields grossly normal, pupils equal, round, reactive to light and accommodation III,IV, VI: ptosis not present, extra-ocular motions intact bilaterally V,VII: smile symmetric, facial light touch sensation decreased on the right. VIII: hearing normal bilaterally IX,X: gag reflex present XI: bilateral shoulder shrug XII: midline tongue extension Motor: Right :  Upper extremity   5/5 Without pronator drift      Left: Upper extremity   5/5 without pronator drift Right:   Lower extremity   5/5                                          Left: Lower extremity   5/5 Tone and bulk:normal tone throughout; no atrophy noted Sensory: Pinprick and light touch intact bilaterally in lower extremities Deep Tendon Reflexes: 2+ and symmetric throughout Plantars: Right: downgoing  Left: downgoing Cerebellar: Finger-to-nose testing intact bilaterally. Heel to shin testing normal bilaterally Gait: not tested due to safety concerns  Data Reviewed  Laboratory Studies:  Basic Metabolic Panel: Recent Labs  Lab 04/08/18 0813  NA 140  K 3.3*  CL 104  CO2 27  GLUCOSE 129*  BUN 8  CREATININE 0.90  CALCIUM 8.4*    Liver Function Tests: Recent Labs  Lab 04/08/18 0813  AST 21  ALT 13  ALKPHOS 48  BILITOT 1.0  PROT 7.4  ALBUMIN 4.4   No results for input(s): LIPASE, AMYLASE in the last 168 hours. No results for input(s): AMMONIA in the last 168 hours.  CBC: Recent Labs  Lab 04/08/18 0813  WBC 11.2*  NEUTROABS 2.2  HGB 13.7  HCT 40.6  MCV 90.5  PLT 210    Cardiac Enzymes: Recent Labs  Lab 04/08/18 0813  TROPONINI <0.03    BNP: Invalid input(s): POCBNP  CBG: Recent Labs  Lab 04/08/18 0812  GLUCAP 75    Microbiology: Results for orders  placed or performed during the hospital encounter of 05/03/17  Gastrointestinal Panel by PCR , Stool     Status: None   Collection Time: 05/03/17  4:50 AM  Result Value Ref Range Status   Campylobacter species NOT DETECTED NOT DETECTED Final   Plesimonas shigelloides NOT DETECTED NOT DETECTED Final   Salmonella species NOT DETECTED NOT DETECTED Final   Yersinia enterocolitica NOT DETECTED NOT DETECTED Final   Vibrio species NOT DETECTED NOT DETECTED Final   Vibrio cholerae NOT DETECTED NOT DETECTED Final   Enteroaggregative E coli (EAEC) NOT DETECTED NOT DETECTED Final   Enteropathogenic E coli (EPEC) NOT DETECTED NOT DETECTED Final   Enterotoxigenic E coli (ETEC) NOT DETECTED NOT DETECTED Final   Shiga like toxin producing E coli (STEC) NOT DETECTED NOT DETECTED Final   Shigella/Enteroinvasive E coli (EIEC) NOT DETECTED NOT DETECTED Final   Cryptosporidium NOT DETECTED NOT DETECTED Final   Cyclospora cayetanensis NOT DETECTED NOT DETECTED Final   Entamoeba histolytica NOT DETECTED NOT DETECTED Final   Giardia lamblia NOT DETECTED NOT DETECTED Final   Adenovirus F40/41 NOT DETECTED NOT DETECTED Final   Astrovirus NOT DETECTED NOT DETECTED Final   Norovirus GI/GII NOT DETECTED NOT DETECTED Final   Rotavirus A NOT DETECTED NOT DETECTED Final   Sapovirus (I, II, IV, and V) NOT DETECTED NOT DETECTED Final  C difficile quick scan w PCR reflex     Status: None   Collection Time: 05/03/17  4:50 AM  Result Value Ref Range Status   C Diff antigen NEGATIVE NEGATIVE Final   C Diff toxin NEGATIVE NEGATIVE Final   C Diff interpretation No C. difficile detected.  Final    Coagulation Studies: Recent Labs    04/08/18 0813  LABPROT 13.1  INR 1.00    Urinalysis: No results for input(s): COLORURINE, LABSPEC, PHURINE, GLUCOSEU, HGBUR, BILIRUBINUR, KETONESUR, PROTEINUR, UROBILINOGEN, NITRITE, LEUKOCYTESUR in the last 168 hours.  Invalid input(s): APPERANCEUR  Lipid Panel:    Component Value  Date/Time   CHOL 173 04/08/2018 0813   CHOL 141 05/22/2012 0410   TRIG 74 04/08/2018 0813   TRIG 99 05/22/2012 0410   HDL 66 04/08/2018 0813   HDL 26 (L) 05/22/2012 0410   CHOLHDL 2.6 04/08/2018 0813   VLDL 15 04/08/2018 0813   VLDL 20 05/22/2012 0410   LDLCALC 92 04/08/2018 0813   LDLCALC 95 05/22/2012 0410    HgbA1C: No results found for: HGBA1C  Urine Drug  Screen:      Component Value Date/Time   LABOPIA POSITIVE (A) 10/16/2014 0714   COCAINSCRNUR NONE DETECTED 10/16/2014 0714   COCAINSCRNUR NEGATIVE 12/14/2012 1958   LABBENZ NONE DETECTED 10/16/2014 0714   AMPHETMU NONE DETECTED 10/16/2014 0714   THCU NONE DETECTED 10/16/2014 0714   LABBARB NONE DETECTED 10/16/2014 0714    Alcohol Level:  Recent Labs  Lab 04/08/18 0813  ETH <10    Other results: EKG: normal EKG, normal sinus rhythm, unchanged from previous tracings, sinus tachycardia. Vent. rate 102 BPM PR interval 172 ms QRS duration 144 ms QT/QTc 380/495 ms P-R-T axes 57 -75 34  Imaging: Ct Head Wo Contrast  Result Date: 04/08/2018 CLINICAL DATA:  Awake in today with lightheadedness, mild right facial droop, poor balance EXAM: CT HEAD WITHOUT CONTRAST TECHNIQUE: Contiguous axial images were obtained from the base of the skull through the vertex without intravenous contrast. COMPARISON:  CT brain of 08/22/2017 FINDINGS: Brain: The ventricular system is stable in size and configuration, and the septum is midline in position. The fourth ventricle and basilar cisterns are unremarkable. No hemorrhage, mass lesion, or acute infarction is seen. Vascular: No vascular abnormality is noted on this unenhanced study. Skull: On bone window images, no acute calvarial abnormality is seen. Sinuses/Orbits: The paranasal sinuses are clear with minimal debris layering posteriorly in the sphenoid sinus, unchanged compared to the CT brain scan from earlier this year. Other: None. IMPRESSION: Negative unenhanced CT of the brain. No acute  intracranial abnormality. Electronically Signed   By: Ivar Drape M.D.   On: 04/08/2018 08:43   Mr Jodene Nam Head Wo Contrast  Result Date: 04/08/2018 CLINICAL DATA:  Focal neuro deficit with stroke suspected, ataxia EXAM: MRI HEAD WITHOUT CONTRAST MRA HEAD WITHOUT CONTRAST TECHNIQUE: Multiplanar, multiecho pulse sequences of the brain and surrounding structures were obtained without intravenous contrast. Angiographic images of the head were obtained using MRA technique without contrast. COMPARISON:  Head CT from earlier today.  Brain MRI 08/22/2017 FINDINGS: MRI HEAD FINDINGS Brain: No acute infarction, hemorrhage, hydrocephalus, extra-axial collection or mass lesion. Symmetric labyrinth signal. Clear CP angle cisterns. Vascular: Normal flow voids. Skull and upper cervical spine: No aggressive bone lesion or focal marrow signal abnormality. Sinuses/Orbits: Negative MRA HEAD FINDINGS Symmetric carotid and vertebral arteries. Major vessels are smooth and widely patent. No branch occlusion, beading, or suspected aneurysm. A small outpouching from the supraclinoid left ICA has an infundibulum appearance on source images. IMPRESSION: Negative brain MRI and MRA. Electronically Signed   By: Monte Fantasia M.D.   On: 04/08/2018 12:09   Mr Brain Wo Contrast  Result Date: 04/08/2018 CLINICAL DATA:  Focal neuro deficit with stroke suspected, ataxia EXAM: MRI HEAD WITHOUT CONTRAST MRA HEAD WITHOUT CONTRAST TECHNIQUE: Multiplanar, multiecho pulse sequences of the brain and surrounding structures were obtained without intravenous contrast. Angiographic images of the head were obtained using MRA technique without contrast. COMPARISON:  Head CT from earlier today.  Brain MRI 08/22/2017 FINDINGS: MRI HEAD FINDINGS Brain: No acute infarction, hemorrhage, hydrocephalus, extra-axial collection or mass lesion. Symmetric labyrinth signal. Clear CP angle cisterns. Vascular: Normal flow voids. Skull and upper cervical spine: No  aggressive bone lesion or focal marrow signal abnormality. Sinuses/Orbits: Negative MRA HEAD FINDINGS Symmetric carotid and vertebral arteries. Major vessels are smooth and widely patent. No branch occlusion, beading, or suspected aneurysm. A small outpouching from the supraclinoid left ICA has an infundibulum appearance on source images. IMPRESSION: Negative brain MRI and MRA. Electronically Signed   By: Angelica Chessman  Watts M.D.   On: 04/08/2018 12:09    Patient seen and examined.  Clinical course and management discussed.  Necessary edits performed.  I agree with the above.  Assessment and plan of care developed and discussed below.     Assessment: 62 y.o. male pertinent history of uncontrolled high blood pressure, alcohol abuse, tobacco abuse, previous TIA presenting  with right-sided facial droop and imbalance.  He also complained of severe headache prior to onset of symptoms.  Etiology likely TIA in the setting of elevated blood pressure.  CT head negative for acute finding.  MRI brain and MRA head personally reviewed showing no acute intracranial abnormality or large vessel occlusion.  US carotid bilateral did not show hemodynamically significant stenosis.  Echocardiogram with EF of 65-70% with right to left atrial shunt noted.  Patient admits to not taking aspirin as previously recommended.  LDL 92.  A1c 5.4.    Stroke Risk Factors - hypertension and smoking  Plan: 1. Aspirin 81 mg and Plavix 75mg  daily  2. Statin for lipid management with target LDL<70. 3. Advised on the importance of blood pressure management with intensive  lifestyle modification including smoking cessation and limiting alcohol use 4. Telemetry monitoring 5. Frequent neuro checks 6. Patient to follow up with neurology and cardiology on an outpatient basis  This patient was staffed with Dr. Magda Paganini, Doy Mince who personally evaluated patient, reviewed documentation and agreed with assessment and plan of care as  above.  Rufina Falco, DNP, FNP-BC Board certified Nurse Practitioner Neurology Department  04/08/2018, 12:14 PM   Alexis Goodell, MD Neurology [phone]  04/09/2018  1:01 PM

## 2018-04-08 NOTE — Progress Notes (Signed)
Advance care planning  Purpose of Encounter Acute CVA and CODE STATUS discussion  Parties in Attendance Patient  Patients Decisional capacity  Patient is alert and oriented.  Able to make medical decisions  Patient does not have documented healthcare power of attorney.  But wants his partner of 8 years [name],[name] to make healthcare decisions for him if he cannot.  Discussed with patient regarding acute CVA, treatment plan, prognosis.  All questions answered  We discussed regarding having a documented healthcare power of attorney and advanced directives.  Discussed CODE STATUS and patient wants to be a full code but not on artificial life support for prolonged periods.  Time spent - 17 minutes

## 2018-04-08 NOTE — ED Notes (Signed)
Called to check on stat ethanol level and was advised that sample was not received in lab until 1013 (sent at [facility]) - lab states it is running now

## 2018-04-08 NOTE — ED Notes (Signed)
CBG 75. °

## 2018-04-08 NOTE — ED Provider Notes (Signed)
[facility] Emergency Department Provider Note       Time seen: ----------------------------------------- 8:19 AM on 04/08/2018 -----------------------------------------   I have reviewed the triage vital signs and the nursing notes.  HISTORY   Chief Complaint Dizziness and Other (Balanace Issues)    HPI [name] is a 62 y.o. male with a history of acid reflux, alcohol abuse, degenerative disc disease, gastroenteritis, hypertension, compression fracture, TIA who presents to the ED for balance disturbance as well as facial drooping.  Patient states he went to bed about 1 or 130 and woke up around 6 or 6:30.  He noticed that the right side of his face was drooping and that he was having some balance trouble.  He describes decreased sensation on the right side of his face as well.  He has never had this happen before.  He had a TIA last year that he describes as vision changes only without any residual problems.  He is been off his medications for the last year.  Past Medical History:  Diagnosis Date  . Acid reflux   . Alcohol abuse   . Arthritis    "left pinky" (10/17/2014)  . DDD (degenerative disc disease), lumbar   . Gastroenteritis   . History of stress test 2001   no ischemia  . Hypertension   . Tobacco abuse   . Vertebral compression fracture (HCC)    T11, L1    Patient Active Problem List   Diagnosis Date Noted  . TIA (transient ischemic attack) 08/22/2017  . Bilateral recurrent inguinal hernia without obstruction or gangrene   . Influenza 10/18/2014  . Hypoxia 10/16/2014  . SOB (shortness of breath) 10/16/2014  . Cough 10/16/2014  . Fever 10/16/2014  . Sepsis ([place]) 10/16/2014  . Nausea and vomiting 10/16/2014  . Acid reflux   . DDD (degenerative disc disease), lumbar   . Hypertension   . Tobacco abuse   . Alcohol abuse   . Essential hypertension     Past Surgical History:  Procedure Laterality Date  . COLON SURGERY     . EXPLORATORY LAPAROTOMY W/ BOWEL RESECTION  1980's    Allergies Penicillins and Morphine and related  Social History Social History   Tobacco Use  . Smoking status: Current Every Day Smoker    Packs/day: 0.50    Years: 27.00    Pack years: 13.50    Types: Cigarettes  . Smokeless tobacco: Never Used  Substance Use Topics  . Alcohol use: Yes    Alcohol/week: 64.0 standard drinks    Types: 15 Cans of beer, 44 Shots of liquor, 5 Standard drinks or equivalent per week    Comment: 4/11/23016 "I drink none to 1 pint of gin/day; 40oz beer when I'm drinking; occasional wine; probably 4 pints/wk; 4, 40oz beers/wk"  . Drug use: No    Types: Marijuana    Comment: 10/17/2014 "last marijuana was in the 1990's"   Review of Systems Constitutional: Negative for fever. Eyes: Negative for vision changes Cardiovascular: Negative for chest pain. Respiratory: Negative for shortness of breath. Gastrointestinal: Negative for abdominal pain, vomiting and diarrhea. Musculoskeletal: Negative for back pain. Skin: Negative for rash. Neurological: Positive for right-sided facial weakness and numbness, balance disturbance  All systems negative/normal/unremarkable except as stated in the HPI  ____________________________________________   PHYSICAL EXAM:  VITAL SIGNS: ED Triage Vitals  Enc Vitals Group     BP 04/08/18 0802 (!) 178/102     Pulse Rate 04/08/18 0802 (!) 104  Resp 04/08/18 0802 18     Temp 04/08/18 0802 98.3 F (36.8 C)     Temp Source 04/08/18 0802 Oral     SpO2 04/08/18 0802 99 %     Weight 04/08/18 0803 217 lb (98.4 kg)     Height 04/08/18 0803 5\' 11"  (1.803 m)     Head Circumference --      Peak Flow --      Pain Score 04/08/18 0807 0     Pain Loc --      Pain Edu? --      Excl. in [place]? --    Constitutional: Alert and oriented. Well appearing and in no distress. Eyes: Conjunctivae are normal. Normal extraocular movements. ENT   Head: Normocephalic and  atraumatic.   Nose: No congestion/rhinnorhea.   Mouth/Throat: Mucous membranes are moist.   Neck: No stridor. Cardiovascular: Normal rate, regular rhythm. No murmurs, rubs, or gallops. Respiratory: Normal respiratory effort without tachypnea nor retractions. Breath sounds are clear and equal bilaterally. No wheezes/rales/rhonchi. Gastrointestinal: Soft and nontender. Normal bowel sounds Musculoskeletal: Nontender with normal range of motion in extremities. No lower extremity tenderness nor edema. Neurologic:  Normal speech and language.  Right-sided facial drooping compared to the left, decreased sensation on the right side of his face compared to left, slightly imprecise finger-to-nose testing, positive Romberg sign, strength appears to be normal elsewhere Skin:  Skin is warm, dry and intact. No rash noted. Psychiatric: Mood and affect are normal. Speech and behavior are normal.  ____________________________________________  EKG: Interpreted by me.  Sinus tachycardia with a rate of 102 bpm, right bundle branch block, left anterior fascicular block, long QT  ____________________________________________  ED COURSE:  As part of my medical decision making, I reviewed the following data within the [place] History obtained from family if available, nursing notes, old chart and ekg, as well as notes from prior ED visits. Patient presented for strokelike symptoms that began somewhere between 1 and 6 AM, we will assess with labs and imaging as indicated at this time.   Procedures ____________________________________________   LABS (pertinent positives/negatives)  Labs Reviewed  CBC - Abnormal; Notable for the following components:      Result Value   WBC 11.2 (*)    RDW 17.0 (*)    All other components within normal limits  DIFFERENTIAL - Abnormal; Notable for the following components:   Eosinophils Absolute 5.7 (*)    All other components within normal limits   COMPREHENSIVE METABOLIC PANEL - Abnormal; Notable for the following components:   Potassium 3.3 (*)    Glucose, Bld 129 (*)    Calcium 8.4 (*)    All other components within normal limits  PROTIME-INR  APTT  TROPONIN I  GLUCOSE, CAPILLARY  ETHANOL  URINE DRUG SCREEN, QUALITATIVE ([facility] ONLY)  CBG MONITORING, ED    RADIOLOGY  CT head Does not reveal any acute process ____________________________________________  DIFFERENTIAL DIAGNOSIS   CVA, TIA, substance abuse, hypertension, medication noncompliance  FINAL ASSESSMENT AND PLAN  CVA   Plan: The patient had presented for strokelike symptoms that began somewhere around 1 and 6 AM. Patient's labs are unremarkable. Patient's imaging is negative.  Given his clinical scenario this is very likely a stroke and I have started him on aspirin.  I will order an MRI and discuss with the hospitalist for admission.   Laurence Aly, MD   Note: This note was generated in part or whole with voice recognition software. Voice  recognition is usually quite accurate but there are transcription errors that can and very often do occur. I apologize for any typographical errors that were not detected and corrected.     Earleen Newport, MD 04/08/18 [phone]

## 2018-04-08 NOTE — Progress Notes (Signed)
*  PRELIMINARY RESULTS* Echocardiogram 2D Echocardiogram has been performed.  [name] 04/08/2018, 3:36 PM

## 2018-04-08 NOTE — ED Notes (Addendum)
First Nurse Note: Patient ambulatory to stat desk and states "I think I may have had a stroke". States he woke up with symptoms approximately 2 hrs ago, was well when he went to bed.  Taken to triage.

## 2018-04-08 NOTE — ED Notes (Signed)
First Nurse Note: Spoke with Dr. Joni Fears about patient.  No new orders.  Patient to Rm 5 via WC, Helene Kelp RN aware of placement.

## 2018-04-09 ENCOUNTER — Inpatient Hospital Stay: Payer: BLUE CROSS/BLUE SHIELD

## 2018-04-09 LAB — HEMOGLOBIN A1C
HEMOGLOBIN A1C: 5.4 % (ref 4.8–5.6)
MEAN PLASMA GLUCOSE: 108 mg/dL

## 2018-04-09 MED ORDER — AMLODIPINE BESYLATE 5 MG PO TABS: 5.0000 mg | ORAL_TABLET | Freq: Every day | ORAL | 2 refills | Status: DC

## 2018-04-09 MED ORDER — ASPIRIN 81 MG PO TBEC: 81.0000 mg | DELAYED_RELEASE_TABLET | Freq: Every day | ORAL | 2 refills | Status: DC

## 2018-04-09 MED ORDER — CLOPIDOGREL BISULFATE 75 MG PO TABS: 75.0000 mg | ORAL_TABLET | Freq: Every day | ORAL | 2 refills | Status: DC

## 2018-04-09 MED ORDER — ATORVASTATIN CALCIUM 10 MG PO TABS: 10.0000 mg | ORAL_TABLET | Freq: Every day | ORAL | 2 refills | Status: DC

## 2018-04-09 NOTE — Discharge Summary (Signed)
[facility] NAME: [name]    MR#:  [id number]  DATE OF BIRTH:  [birth year]  DATE OF ADMISSION:  04/08/2018   ADMITTING PHYSICIAN: Hillary Bow, MD  DATE OF DISCHARGE: 04/09/2018  2:08 PM  PRIMARY CARE PHYSICIAN: Patient, No Pcp Per   ADMISSION DIAGNOSIS:   Cerebrovascular accident (CVA), unspecified mechanism ([place]) [I63.9]  DISCHARGE DIAGNOSIS:   Active Problems:   Acute CVA (cerebrovascular accident) ([place])   SECONDARY DIAGNOSIS:   Past Medical History:  Diagnosis Date  . Acid reflux   . Alcohol abuse   . Arthritis    "left pinky" (10/17/2014)  . DDD (degenerative disc disease), lumbar   . Gastroenteritis   . History of stress test 2001   no ischemia  . Hypertension   . Tobacco abuse   . Vertebral compression fracture (HCC)    T11, L1    HOSPITAL COURSE:   62 year old male with past medical history significant for uncontrolled high blood pressure, smoking, alcohol use presents to hospital continued right-sided facial droop and imbalance.  1.  Possible TIA-admitted for stroke work-up.  Symptoms have resolved within the following 24 hours.  Blood pressure remained elevated. -CT of the head negative for acute abnormalities -MRI and MRA of the brain did not show any acute findings either. -Carotid Dopplers with no hemodynamically significant stenosis. -Echocardiogram showing positive for bubble study.  Dopplers of lower extremities ordered to rule out DVT and they have been negative. -Appreciate neurology consult. -Patient was advised to control his underlying risk factors. -Started on aspirin and Plavix.  Also restarted on statin  2.  Hypertension-started on Norvasc  3.  Hyperlipidemia-LDL of 92.  Started on statin at this time.  4.  Tobacco and alcohol abuse-strongly counseled against them  Up and ambulatory.  Physical therapy and Occupational Therapy has seen the patient.  He is being discharged  today  DISCHARGE CONDITIONS:   Guarded  CONSULTS OBTAINED:   Treatment Team:  Catarina Hartshorn, MD Alexis Goodell, MD  DRUG ALLERGIES:   Allergies  Allergen Reactions  . Penicillins Anaphylaxis    Has patient had a PCN reaction causing immediate rash, facial/tongue/throat swelling, SOB or lightheadedness with hypotension: Yes Has patient had a PCN reaction causing severe rash involving mucus membranes or skin necrosis: No Has patient had a PCN reaction that required hospitalization Yes Has patient had a PCN reaction occurring within the last 10 years: No If all of the above answers are "NO", then may proceed with Cephalosporin use.  Marland Kitchen Morphine And Related Itching   DISCHARGE MEDICATIONS:   Allergies as of 04/09/2018      Reactions   Penicillins Anaphylaxis   Has patient had a PCN reaction causing immediate rash, facial/tongue/throat swelling, SOB or lightheadedness with hypotension: Yes Has patient had a PCN reaction causing severe rash involving mucus membranes or skin necrosis: No Has patient had a PCN reaction that required hospitalization Yes Has patient had a PCN reaction occurring within the last 10 years: No If all of the above answers are "NO", then may proceed with Cephalosporin use.   Morphine And Related Itching      Medication List    STOP taking these medications   cyclobenzaprine 5 MG tablet Commonly known as:  FLEXERIL   meloxicam 15 MG tablet Commonly known as:  MOBIC     TAKE these medications   albuterol 108 (90 Base) MCG/ACT inhaler Commonly known as:  PROVENTIL HFA;VENTOLIN HFA Inhale  2 puffs into the lungs every 6 (six) hours as needed for wheezing or shortness of breath.   amLODipine 5 MG tablet Commonly known as:  NORVASC Take 1 tablet (5 mg total) by mouth daily.   aspirin 81 MG EC tablet Take 1 tablet (81 mg total) by mouth daily.   atorvastatin 10 MG tablet Commonly known as:  LIPITOR Take 1 tablet (10 mg total) by mouth  daily.   clopidogrel 75 MG tablet Commonly known as:  PLAVIX Take 1 tablet (75 mg total) by mouth daily.   omeprazole 20 MG capsule Commonly known as:  PRILOSEC Take 20 mg by mouth daily.        DISCHARGE INSTRUCTIONS:   1. PCP f/u in 1 week  DIET:   Cardiac diet  ACTIVITY:   Activity as tolerated  OXYGEN:   Home Oxygen: No.  Oxygen Delivery: room air  DISCHARGE LOCATION:   home   If you experience worsening of your admission symptoms, develop shortness of breath, life threatening emergency, suicidal or homicidal thoughts you must seek medical attention immediately by calling 911 or calling your MD immediately  if symptoms less severe.  You Must read complete instructions/literature along with all the possible adverse reactions/side effects for all the Medicines you take and that have been prescribed to you. Take any new Medicines after you have completely understood and accpet all the possible adverse reactions/side effects.   Please note  You were cared for by a hospitalist during your hospital stay. If you have any questions about your discharge medications or the care you received while you were in the hospital after you are discharged, you can call the unit and asked to speak with the hospitalist on call if the hospitalist that took care of you is not available. Once you are discharged, your primary care physician will handle any further medical issues. Please note that NO REFILLS for any discharge medications will be authorized once you are discharged, as it is imperative that you return to your primary care physician (or establish a relationship with a primary care physician if you do not have one) for your aftercare needs so that they can reassess your need for medications and monitor your lab values.    On the day of Discharge:  VITAL SIGNS:   Blood pressure (!) 156/95, pulse 82, temperature 98.1 F (36.7 C), temperature source Oral, resp. rate 20, height 5'  11" (1.803 m), weight 98.4 kg, SpO2 99 %.  PHYSICAL EXAMINATION:    GENERAL:  62 y.o.-year-old patient lying in the bed with no acute distress.  EYES: Pupils equal, round, reactive to light and accommodation. No scleral icterus. Extraocular muscles intact.  HEENT: Head atraumatic, normocephalic. Oropharynx and nasopharynx clear.  NECK:  Supple, no jugular venous distention. No thyroid enlargement, no tenderness.  LUNGS: Normal breath sounds bilaterally, no wheezing, rales,rhonchi or crepitation. No use of accessory muscles of respiration.  CARDIOVASCULAR: S1, S2 normal. No murmurs, rubs, or gallops.  ABDOMEN: Soft, non-tender, non-distended. Bowel sounds present. No organomegaly or mass.  EXTREMITIES: No pedal edema, cyanosis, or clubbing.  NEUROLOGIC: Cranial nerves II through XII are intact. Muscle strength 5/5 in all extremities. Sensation intact. Gait not checked.  PSYCHIATRIC: The patient is alert and oriented x 3.  SKIN: No obvious rash, lesion, or ulcer.   DATA REVIEW:   CBC Recent Labs  Lab 04/08/18 0813  WBC 11.2*  HGB 13.7  HCT 40.6  PLT 210    Chemistries  Recent Labs  Lab 04/08/18 0813  NA 140  K 3.3*  CL 104  CO2 27  GLUCOSE 129*  BUN 8  CREATININE 0.90  CALCIUM 8.4*  AST 21  ALT 13  ALKPHOS 48  BILITOT 1.0     Microbiology Results  Results for orders placed or performed during the hospital encounter of 05/03/17  Gastrointestinal Panel by PCR , Stool     Status: None   Collection Time: 05/03/17  4:50 AM  Result Value Ref Range Status   Campylobacter species NOT DETECTED NOT DETECTED Final   Plesimonas shigelloides NOT DETECTED NOT DETECTED Final   Salmonella species NOT DETECTED NOT DETECTED Final   Yersinia enterocolitica NOT DETECTED NOT DETECTED Final   Vibrio species NOT DETECTED NOT DETECTED Final   Vibrio cholerae NOT DETECTED NOT DETECTED Final   Enteroaggregative E coli (EAEC) NOT DETECTED NOT DETECTED Final   Enteropathogenic E coli  (EPEC) NOT DETECTED NOT DETECTED Final   Enterotoxigenic E coli (ETEC) NOT DETECTED NOT DETECTED Final   Shiga like toxin producing E coli (STEC) NOT DETECTED NOT DETECTED Final   Shigella/Enteroinvasive E coli (EIEC) NOT DETECTED NOT DETECTED Final   Cryptosporidium NOT DETECTED NOT DETECTED Final   Cyclospora cayetanensis NOT DETECTED NOT DETECTED Final   Entamoeba histolytica NOT DETECTED NOT DETECTED Final   Giardia lamblia NOT DETECTED NOT DETECTED Final   Adenovirus F40/41 NOT DETECTED NOT DETECTED Final   Astrovirus NOT DETECTED NOT DETECTED Final   Norovirus GI/GII NOT DETECTED NOT DETECTED Final   Rotavirus A NOT DETECTED NOT DETECTED Final   Sapovirus (I, II, IV, and V) NOT DETECTED NOT DETECTED Final  C difficile quick scan w PCR reflex     Status: None   Collection Time: 05/03/17  4:50 AM  Result Value Ref Range Status   C Diff antigen NEGATIVE NEGATIVE Final   C Diff toxin NEGATIVE NEGATIVE Final   C Diff interpretation No C. difficile detected.  Final    RADIOLOGY:  US Carotid Bilateral  Result Date: 04/08/2018 CLINICAL DATA:  62 year old male with a history of TIA EXAM: BILATERAL CAROTID DUPLEX ULTRASOUND TECHNIQUE: Pearline Cables scale imaging, color Doppler and duplex ultrasound were performed of bilateral carotid and vertebral arteries in the neck. COMPARISON:  None. FINDINGS: Criteria: Quantification of carotid stenosis is based on velocity parameters that correlate the residual internal carotid diameter with NASCET-based stenosis levels, using the diameter of the distal internal carotid lumen as the denominator for stenosis measurement. The following velocity measurements were obtained: RIGHT ICA:  Systolic 58 cm/sec, Diastolic 21 cm/sec CCA:  74 cm/sec SYSTOLIC ICA/CCA RATIO:  0.8 ECA:  88 cm/sec LEFT ICA:  Systolic 53 cm/sec, Diastolic 21 cm/sec CCA:  92 cm/sec SYSTOLIC ICA/CCA RATIO:  0.6 ECA:  80 cm/sec Right Brachial SBP: Not acquired Left Brachial SBP: Not acquired RIGHT  CAROTID ARTERY: No significant calcified disease of the right common carotid artery. Intermediate waveform maintained. Heterogeneous plaque without significant calcifications at the right carotid bifurcation. Low resistance waveform of the right ICA. No significant tortuosity. RIGHT VERTEBRAL ARTERY: Antegrade flow with low resistance waveform. LEFT CAROTID ARTERY: No significant calcified disease of the left common carotid artery. Intermediate waveform maintained. Heterogeneous plaque at the left carotid bifurcation without significant calcifications. Low resistance waveform of the left ICA. LEFT VERTEBRAL ARTERY:  Antegrade flow with low resistance waveform. IMPRESSION: Color duplex indicates minimal heterogeneous plaque, with no hemodynamically significant stenosis by duplex criteria in the extracranial cerebrovascular circulation. Signed, Dulcy Fanny. Earleen Newport DO, RPVI [facility] Radiology Electronically Signed   By: Corrie Mckusick D.O.   On: 04/08/2018 16:51   US Venous Img Lower Bilateral  Result Date: 04/09/2018 CLINICAL DATA:  Shortness of breath and wheezing.  Evaluate for DVT. EXAM: BILATERAL LOWER EXTREMITY VENOUS DOPPLER ULTRASOUND TECHNIQUE: Gray-scale sonography with graded compression, as well as color Doppler and duplex ultrasound were performed to evaluate the lower extremity deep venous systems from the level of the common femoral vein and including the common femoral, femoral, profunda femoral, popliteal and calf veins including the posterior tibial, peroneal and gastrocnemius veins when visible. The superficial great saphenous vein was also interrogated. Spectral Doppler was utilized to evaluate flow at rest and with distal augmentation maneuvers in the common femoral, femoral and popliteal veins. COMPARISON:  None. FINDINGS: RIGHT LOWER EXTREMITY Common Femoral Vein: No evidence of thrombus. Normal compressibility, respiratory phasicity and  response to augmentation. Saphenofemoral Junction: No evidence of thrombus. Normal compressibility and flow on color Doppler imaging. Profunda Femoral Vein: No evidence of thrombus. Normal compressibility and flow on color Doppler imaging. Femoral Vein: No evidence of thrombus. Normal compressibility, respiratory phasicity and response to augmentation. Popliteal Vein: No evidence of thrombus. Normal compressibility, respiratory phasicity and response to augmentation. Calf Veins: No evidence of thrombus. Normal compressibility and flow on color Doppler imaging. Superficial Great Saphenous Vein: No evidence of thrombus. Normal compressibility. Venous Reflux:  None. Other Findings:  None. LEFT LOWER EXTREMITY Common Femoral Vein: No evidence of thrombus. Normal compressibility, respiratory phasicity and response to augmentation. Saphenofemoral Junction: No evidence of thrombus. Normal compressibility and flow on color Doppler imaging. Profunda Femoral Vein: No evidence of thrombus. Normal compressibility and flow on color Doppler imaging. Femoral Vein: No evidence of thrombus. Normal compressibility, respiratory phasicity and response to augmentation. Popliteal Vein: No evidence of thrombus. Normal compressibility, respiratory phasicity and response to augmentation. Calf Veins: No evidence of thrombus. Normal compressibility and flow on color Doppler imaging. Superficial Great Saphenous Vein: No evidence of thrombus. Normal compressibility. Venous Reflux:  None. Other Findings:  None. IMPRESSION: No evidence of DVT within either lower extremity. Electronically Signed   By: Sandi Mariscal M.D.   On: 04/09/2018 11:05     Management plans discussed with the patient, family and they are in agreement.  CODE STATUS:     Code Status Orders  (From admission, onward)         Start     Ordered   04/08/18 1054  Full code  Continuous     04/08/18 1055        Code Status History    Date Active Date Inactive Code  Status Order ID Comments User Context   08/22/2017 1845 08/23/2017 1639 Full Code [id number]  Henreitta Leber, MD Inpatient   10/16/2014 0713 10/18/2014 1512 Full Code [id number]  Ivor Costa, MD Inpatient      TOTAL TIME TAKING CARE OF THIS PATIENT: 38 minutes.    Shaia Porath M.D on 04/09/2018 at 3:07 PM  Between 7am to 6pm - Pager - [phone]  After 6pm go to [email or web address] - Proofreader  [facility]  Office  [phone]  CC: Primary care physician; Patient, No Pcp Per   Note: This dictation was prepared with Dragon dictation along with smaller phrase technology. Any transcriptional errors that result from this process are unintentional.

## 2018-04-09 NOTE — Progress Notes (Signed)
     [name] was admitted to the [facility] on 04/08/2018 for an acute medical condition and is being Discharged on  04/09/2018 . He will need another 2 days for recovery and so advised to stay away from work until then. So please excuse him  from work  for the above  Days. Should be able to return to work without any restrictions from 04/13/18.  Call Gladstone Lighter  MD, [facility] at  [phone] with questions.  Gladstone Lighter M.D on 04/09/2018,at 11:28 AM  [facility] [address] [place] [address]

## 2018-04-09 NOTE — Progress Notes (Signed)
OT Cancellation Note  Patient Details Name: [name] MRN: [id number] DOB: 1956/01/24   Cancelled Treatment:    Reason Eval/Treat Not Completed: OT screened, no needs identified, will sign off. Order received, chart reviewed. Pt back to baseline functional independence. No sensory, strength, coordination, balance, cognitive, or visual deficits appreciated. No skilled OT needs identified. Will sign off. Please re-consult if additional needs arise.    Jeni Salles, MPH, MS, OTR/L ascom [phone] 04/09/18, 10:13 AM

## 2018-04-09 NOTE — Progress Notes (Signed)
SLP Cancellation Note  Patient Details Name: [name] MRN: [id number] DOB: 09/22/1955   Cancelled treatment:       Reason Eval/Treat Not Completed: SLP screened, no needs identified, will sign off(chart reviewed; consulted NSG then met w/ pt). Pt denied any difficulty swallowing and is currently on a regular diet; tolerates swallowing pills w/ water per NSG. Pt conversed at conversational level w/out deficits noted; pt denied any speech-language deficits.  No further skilled ST services indicated as pt appears at his baseline. Pt agreed. NSG to reconsult if any change in status.     Orinda Kenner, MS, CCC-SLP Leonda Cristo 04/09/2018, 12:13 PM

## 2018-04-09 NOTE — Progress Notes (Signed)
PT Cancellation Note  Patient Details Name: [name] MRN: [id number] DOB: March 21, 1956   Cancelled Treatment:    Reason Eval/Treat Not Completed: PT screened, no needs identified, will sign off.  PT order received.  Chart reviewed.  CT of head and MRI of brain negative for acute findings.  Pt reporting being back to baseline functional independence and pt demonstrated ability to ambulate around nursing loop (>240 feet) no AD and ascend/descend 6 steps no railing (all independently).  Also independent with bed mobility and transfers.  No loss of balance with ambulation with head turns R/L/up/down, increasing/decreasing speed, and turning and stopping.  No sensory, strength, coordination, balance, or cognitive deficits noted.  No PT needs identified; will sign off.  Please re-consult if additional needs arise.  Leitha Bleak, PT 04/09/18, 10:31 AM [phone]

## 2018-05-30 ENCOUNTER — Emergency Department: Payer: BLUE CROSS/BLUE SHIELD

## 2018-05-30 ENCOUNTER — Emergency Department
Admission: EM | Admit: 2018-05-30 | Discharge: 2018-05-30 | Disposition: A | Discharge status: Home / Self Care | Payer: BLUE CROSS/BLUE SHIELD | Attending: Emergency Medicine | Admitting: Emergency Medicine

## 2018-05-30 ENCOUNTER — Other Ambulatory Visit: Payer: Self-pay

## 2018-05-30 ENCOUNTER — Encounter: Payer: Self-pay | Admitting: Emergency Medicine

## 2018-05-30 DIAGNOSIS — Z79899 Other long term (current) drug therapy: Secondary | ICD-10-CM | POA: Insufficient documentation

## 2018-05-30 DIAGNOSIS — Y9389 Activity, other specified: Secondary | ICD-10-CM | POA: Insufficient documentation

## 2018-05-30 DIAGNOSIS — Z7902 Long term (current) use of antithrombotics/antiplatelets: Secondary | ICD-10-CM | POA: Diagnosis not present

## 2018-05-30 DIAGNOSIS — F17210 Nicotine dependence, cigarettes, uncomplicated: Secondary | ICD-10-CM | POA: Insufficient documentation

## 2018-05-30 DIAGNOSIS — N3289 Other specified disorders of bladder: Secondary | ICD-10-CM | POA: Insufficient documentation

## 2018-05-30 DIAGNOSIS — Z7982 Long term (current) use of aspirin: Secondary | ICD-10-CM | POA: Insufficient documentation

## 2018-05-30 DIAGNOSIS — S3692XA Contusion of unspecified intra-abdominal organ, initial encounter: Secondary | ICD-10-CM | POA: Diagnosis not present

## 2018-05-30 DIAGNOSIS — I10 Essential (primary) hypertension: Secondary | ICD-10-CM | POA: Insufficient documentation

## 2018-05-30 DIAGNOSIS — S3991XA Unspecified injury of abdomen, initial encounter: Secondary | ICD-10-CM | POA: Diagnosis present

## 2018-05-30 DIAGNOSIS — E876 Hypokalemia: Secondary | ICD-10-CM | POA: Diagnosis not present

## 2018-05-30 DIAGNOSIS — Y929 Unspecified place or not applicable: Secondary | ICD-10-CM | POA: Diagnosis not present

## 2018-05-30 DIAGNOSIS — Z8673 Personal history of transient ischemic attack (TIA), and cerebral infarction without residual deficits: Secondary | ICD-10-CM | POA: Insufficient documentation

## 2018-05-30 DIAGNOSIS — X509XXA Other and unspecified overexertion or strenuous movements or postures, initial encounter: Secondary | ICD-10-CM | POA: Diagnosis not present

## 2018-05-30 DIAGNOSIS — IMO0001 Reserved for inherently not codable concepts without codable children: Secondary | ICD-10-CM

## 2018-05-30 DIAGNOSIS — Y998 Other external cause status: Secondary | ICD-10-CM | POA: Insufficient documentation

## 2018-05-30 LAB — CBC WITH DIFFERENTIAL/PLATELET
ABS IMMATURE GRANULOCYTES: 0.01 10*3/uL (ref 0.00–0.07)
BASOS ABS: 0 10*3/uL (ref 0.0–0.1)
Basophils Relative: 1 %
Eosinophils Absolute: 0.9 10*3/uL — ABNORMAL HIGH (ref 0.0–0.5)
Eosinophils Relative: 19 %
HCT: 35.4 % — ABNORMAL LOW (ref 39.0–52.0)
Hemoglobin: 11.5 g/dL — ABNORMAL LOW (ref 13.0–17.0)
IMMATURE GRANULOCYTES: 0 %
LYMPHS ABS: 1.4 10*3/uL (ref 0.7–4.0)
Lymphocytes Relative: 31 %
MCH: 29.5 pg (ref 26.0–34.0)
MCHC: 32.5 g/dL (ref 30.0–36.0)
MCV: 90.8 fL (ref 80.0–100.0)
Monocytes Absolute: 0.4 10*3/uL (ref 0.1–1.0)
Monocytes Relative: 8 %
NEUTROS ABS: 1.9 10*3/uL (ref 1.7–7.7)
NEUTROS PCT: 41 %
NRBC: 0 % (ref 0.0–0.2)
PLATELETS: 182 10*3/uL (ref 150–400)
RBC: 3.9 MIL/uL — ABNORMAL LOW (ref 4.22–5.81)
RDW: 15 % (ref 11.5–15.5)
WBC: 4.5 10*3/uL (ref 4.0–10.5)

## 2018-05-30 LAB — COMPREHENSIVE METABOLIC PANEL
ALBUMIN: 4.2 g/dL (ref 3.5–5.0)
ALT: 24 U/L (ref 0–44)
ANION GAP: 7 (ref 5–15)
AST: 29 U/L (ref 15–41)
Alkaline Phosphatase: 42 U/L (ref 38–126)
BUN: 11 mg/dL (ref 8–23)
CHLORIDE: 106 mmol/L (ref 98–111)
CO2: 25 mmol/L (ref 22–32)
Calcium: 8.4 mg/dL — ABNORMAL LOW (ref 8.9–10.3)
Creatinine, Ser: 0.61 mg/dL (ref 0.61–1.24)
GFR calc Af Amer: >60 mL/min (ref >60)
GFR calc non Af Amer: >60 mL/min (ref >60)
GLUCOSE: 98 mg/dL (ref 70–99)
POTASSIUM: 3.2 mmol/L — AB (ref 3.5–5.1)
SODIUM: 138 mmol/L (ref 135–145)
Total Bilirubin: 0.5 mg/dL (ref 0.3–1.2)
Total Protein: 6.8 g/dL (ref 6.5–8.1)

## 2018-05-30 MED ORDER — IOPAMIDOL (ISOVUE-300) INJECTION 61%
100.0000 mL | Freq: Once | INTRAVENOUS | Status: AC | PRN
  Administered 2018-05-30: 100 mL via INTRAVENOUS
  Filled 2018-05-30: qty 100

## 2018-05-30 NOTE — Discharge Instructions (Addendum)
Please call the urologist Monday morning to schedule an appointment.  Avoid heavy lifting until you no longer have pain in your abdomen.  If you get worse in any way, return to the ER immediately.  Also, your potassium was a little low today. Please try and increase potassium in your diet. Follow up with your primary care provider for a recheck in about a week.

## 2018-05-30 NOTE — ED Notes (Signed)
Pt states upper quadrant pain that was sudden onset that he describes as "aching". Pt states happened at work but not workers comp.

## 2018-05-30 NOTE — ED Provider Notes (Signed)
[facility] Emergency Department Provider Note  ____________________________________________   None    (approximate)  I have reviewed the triage vital signs and the nursing notes.   HISTORY  Chief Complaint Abdominal Pain   HPI [name] is a 62 y.o. male who presents to the emergency department for treatment and evaluation of right abdominal wall pain. He was lifting a sofa table and felt a sudden pain in his right upper abdomen and now can feel a knot. No history of laparoscopic abdominal surgery or hernias. No alleviating measures prior to arrival.   Past Medical History:  Diagnosis Date  . Acid reflux   . Alcohol abuse   . Arthritis    "left pinky" (10/17/2014)  . DDD (degenerative disc disease), lumbar   . Gastroenteritis   . History of stress test 2001   no ischemia  . Hypertension   . Tobacco abuse   . Vertebral compression fracture (HCC)    T11, L1    Patient Active Problem List   Diagnosis Date Noted  . Acute CVA (cerebrovascular accident) ([place]) 04/08/2018  . TIA (transient ischemic attack) 08/22/2017  . Bilateral recurrent inguinal hernia without obstruction or gangrene   . Influenza 10/18/2014  . Hypoxia 10/16/2014  . SOB (shortness of breath) 10/16/2014  . Cough 10/16/2014  . Fever 10/16/2014  . Sepsis ([place]) 10/16/2014  . Nausea and vomiting 10/16/2014  . Acid reflux   . DDD (degenerative disc disease), lumbar   . Hypertension   . Tobacco abuse   . Alcohol abuse   . Essential hypertension     Past Surgical History:  Procedure Laterality Date  . COLON SURGERY    . EXPLORATORY LAPAROTOMY W/ BOWEL RESECTION  1980's    Prior to Admission medications   Medication Sig Start Date End Date Taking? Authorizing Provider  albuterol (PROVENTIL HFA;VENTOLIN HFA) 108 (90 Base) MCG/ACT inhaler Inhale 2 puffs into the lungs every 6 (six) hours as needed for wheezing or shortness of breath. 07/12/17   Johnn Hai,  [name]  amLODipine (NORVASC) 5 MG tablet Take 1 tablet (5 mg total) by mouth daily. 04/09/18 07/08/18  Gladstone Lighter, MD  aspirin 81 MG EC tablet Take 1 tablet (81 mg total) by mouth daily. 04/09/18   Gladstone Lighter, MD  atorvastatin (LIPITOR) 10 MG tablet Take 1 tablet (10 mg total) by mouth daily. 04/09/18 07/08/18  Gladstone Lighter, MD  clopidogrel (PLAVIX) 75 MG tablet Take 1 tablet (75 mg total) by mouth daily. 04/09/18 04/09/19  Gladstone Lighter, MD  omeprazole (PRILOSEC) 20 MG capsule Take 20 mg by mouth daily.    [provider]    Allergies Penicillins and Morphine and related  Family History  Problem Relation Age of Onset  . Diabetes Mother   . Hypertension Mother   . Stroke Father   . Heart failure Father   . Heart failure Other     Social History Social History   Tobacco Use  . Smoking status: Current Every Day Smoker    Packs/day: 0.50    Years: 27.00    Pack years: 13.50    Types: Cigarettes  . Smokeless tobacco: Never Used  Substance Use Topics  . Alcohol use: Yes    Alcohol/week: 64.0 standard drinks    Types: 15 Cans of beer, 44 Shots of liquor, 5 Standard drinks or equivalent per week    Comment: 4/11/23016 "I drink none to 1 pint of gin/day; 40oz beer when I'm drinking; occasional  wine; probably 4 pints/wk; 4, 40oz beers/wk"  . Drug use: No    Types: Marijuana    Comment: 10/17/2014 "last marijuana was in the 1990's"    Review of Systems  Constitutional: No fever/chills Eyes: No visual changes. ENT: No sore throat. Cardiovascular: Denies chest pain. Respiratory: Denies shortness of breath. Gastrointestinal: No abdominal pain.  No nausea, no vomiting.  No diarrhea.  No constipation. Genitourinary: Negative for dysuria. Musculoskeletal: Negative for back pain. Skin: Negative for rash. Neurological: Negative for headaches, focal weakness or numbness. ____________________________________________   PHYSICAL EXAM:  VITAL SIGNS: ED  Triage Vitals [05/30/18 1437]  Enc Vitals Group     BP (!) 159/91     Pulse Rate 99     Resp 18     Temp 98.3 F (36.8 C)     Temp Source Oral     SpO2 100 %     Weight 217 lb (98.4 kg)     Height 5\' 11"  (1.803 m)     Head Circumference      Peak Flow      Pain Score 5     Pain Loc      Pain Edu?      Excl. in [place]?     Constitutional: Alert and oriented. Well appearing and in no acute distress. Eyes: Conjunctivae are normal. PERRL. EOMI. Head: Atraumatic. Nose: No congestion/rhinnorhea. Mouth/Throat: Mucous membranes are moist.  Neck: No stridor.   Cardiovascular: Normal rate, regular rhythm. Grossly normal heart sounds.  Good peripheral circulation. Respiratory: Normal respiratory effort.  No retractions. Lungs CTAB. Gastrointestinal: Soft. Tender in the area of a palpable mass in the abdominal wall. Appears as a hematoma on the skin surface. No distention. No CVA tenderness. Musculoskeletal: No lower extremity tenderness nor edema.  No joint effusions. Neurologic:  Normal speech and language. No gross focal neurologic deficits are appreciated. No gait instability. Skin:  Skin is warm, dry and intact. No rash noted. Skin overlying palpable mass is ecchymotic. Psychiatric: Mood and affect are normal. Speech and behavior are normal.  ____________________________________________   LABS (all labs ordered are listed, but only abnormal results are displayed)  Labs Reviewed  COMPREHENSIVE METABOLIC PANEL - Abnormal; Notable for the following components:      Result Value   Potassium 3.2 (*)    Calcium 8.4 (*)    All other components within normal limits  CBC WITH DIFFERENTIAL/PLATELET - Abnormal; Notable for the following components:   RBC 3.90 (*)    Hemoglobin 11.5 (*)    HCT 35.4 (*)    Eosinophils Absolute 0.9 (*)    All other components within normal limits    ____________________________________________  EKG   ____________________________________________  RADIOLOGY  ED MD interpretation:  [place] not helpful in diagnosis.  CT shows possible hemorrhage or infection, small paraumbilical and moderate bilateral inguinal hernias containing fat, and bladder wall thickening.   Official radiology report(s): Ct Abdomen Pelvis W Contrast  Result Date: 05/30/2018 CLINICAL DATA:  62 y/o  M; right abdominal wall pain. EXAM: CT ABDOMEN AND PELVIS WITH CONTRAST TECHNIQUE: Multidetector CT imaging of the abdomen and pelvis was performed using the standard protocol following bolus administration of intravenous contrast. CONTRAST:  133mL ISOVUE-300 IOPAMIDOL (ISOVUE-300) INJECTION 61% COMPARISON:  05/03/2017, 02/24/2016, 05/14/2013 CT abdomen and pelvis. 05/30/2018 abdominal soft tissue ultrasound. FINDINGS: Lower chest: No acute abnormality. Hepatobiliary: Simple cyst within the dome of liver measuring up to 19 mm, previously 16 mm. No new focal liver lesion. No gallbladder wall  thickening, gallstones, or biliary ductal dilatation. Pancreas: Unremarkable. No pancreatic ductal dilatation or surrounding inflammatory changes. Spleen: Normal in size without focal abnormality. Adrenals/Urinary Tract: Stable left kidney upper pole 4.1 cm cyst. Stable tiny lucency in the right kidney upper pole, likely cyst. No urinary stone disease or hydronephrosis. Normal adrenal glands. The dome of bladder is again seen pulled towards the right inguinal canal hernia with severe wall thickening which has progressed when compared with the prior CT of abdomen and pelvis, similar to 02/24/2016 CT abdomen and pelvis. Stomach/Bowel: Stomach is within normal limits. Appendix appears normal. No evidence of bowel wall thickening, distention, or inflammatory changes. Left upper quadrant small bowel anastomosis without findings of obstruction. Mild colonic diverticulosis, no findings of acute  diverticulitis. Vascular/Lymphatic: Aortic atherosclerosis. No enlarged abdominal or pelvic lymph nodes. Reproductive: Prostate enlargement. Other: Small fat containing paraumbilical hernia. Moderate bilateral fat containing inguinal hernias. Musculoskeletal: Small focus of fat stranding within the right lower abdominal wall subcutaneous fat, likely corresponding to the abnormality described on the prior soft tissue ultrasound. No hernia. IMPRESSION: 1. Small focus of fat stranding in the right lower abdominal wall subcutaneous fat, likely corresponding to abnormality described on the prior soft tissue ultrasound as possible hemorrhage or infection. No associated hernia. 2. Stable small paraumbilical and moderate bilateral inguinal hernias containing fat. 3. Dome of bladder again seen pulled towards the right inguinal hernia with severe wall thickening, waxing and waning compared with prior studies. Wall thickening likely represents chronic infection, inflammation, or possibly vascular congestion due to mass effect from the hernia. Underlying urothelial malignancy is also possible, direct visualization recommended. Electronically Signed   By: [name] M.D.   On: 05/30/2018 17:58   US Abdomen Limited  Result Date: 05/30/2018 CLINICAL DATA:  62 year old male with palpable LEFT abdominal mass following lifting injury. EXAM: ULTRASOUND ABDOMEN LIMITED COMPARISON:  None. FINDINGS: Targeted ultrasound of the RIGHT abdomen at the area of palpable concern noted. An ill-defined 1.4 x 1.3 x 3 cm hyperechoic area containing fluid in the subcutaneous tissues of the anterior RIGHT abdomen noted corresponding to the patient's palpable abnormality. There is no evidence of underlying abdominal wall defect. IMPRESSION: Ill-defined 1.4 x 1.3 x 3 cm anterior RIGHT abdominal subcutaneous structure, likely representing hemorrhage, inflammation or ill-defined infection. No evidence of abdominal wall hernia. Clinical  or ultrasound follow-up is recommended to ensure improvement. Electronically Signed   By: Margarette Canada M.D.   On: 05/30/2018 16:04    ____________________________________________   PROCEDURES  Procedure(s) performed: None  Procedures  Critical Care performed: No  ____________________________________________   INITIAL IMPRESSION / ASSESSMENT AND PLAN / ED COURSE  As part of my medical decision making, I reviewed the following data within the electronic MEDICAL RECORD NUMBER    62 year old male presenting to the emergency department for evaluation of an abdominal mass that occurred suddenly while moving a table.  Differential diagnosis is wide as described in the ultrasound.  Because there is an area of ecchymosis overlying the skin directly correlating with the palpable mass, feel that the most likely diagnosis is hematoma.  The patient was given ER return precautions and advised to follow-up with her primary care to make sure that the area completely resolves as well as for recheck of his potassium.  Potassium rich foods were discussed with him.  Also of concern, bladder wall thickening was identified on the CT.  Patient was advised that he needs to call urology on Monday morning to request an appointment.  Patient  verbalizes his intent to do so.  ___________________________________________   FINAL CLINICAL IMPRESSION(S) / ED DIAGNOSES  Final diagnoses:  Bladder wall thickening  Intra-abdominal hematoma, initial encounter  Hypokalemia     ED Discharge Orders    None       Note:  This document was prepared using Dragon voice recognition software and may include unintentional dictation errors.    Victorino Dike, FNP 05/30/18 1857    Lavonia Drafts, MD 05/30/18 1911

## 2018-05-30 NOTE — ED Triage Notes (Signed)
Pt presents to ED via POV with c/o R abdominal wall pain. Pt states approx 30 mins PTA he was moving something heavy with sudden onset pain, pt states a knot is palpable to RUQ.

## 2018-06-08 ENCOUNTER — Encounter: Payer: Self-pay | Admitting: Urology

## 2018-06-08 ENCOUNTER — Ambulatory Visit: Payer: Self-pay | Admitting: Urology

## 2018-12-07 DIAGNOSIS — S2249XA Multiple fractures of ribs, unspecified side, initial encounter for closed fracture: Secondary | ICD-10-CM

## 2018-12-07 HISTORY — DX: Multiple fractures of ribs, unspecified side, initial encounter for closed fracture: S22.49XA

## 2018-12-25 ENCOUNTER — Emergency Department (HOSPITAL_COMMUNITY): Payer: No Typology Code available for payment source

## 2018-12-25 ENCOUNTER — Other Ambulatory Visit: Payer: Self-pay

## 2018-12-25 ENCOUNTER — Inpatient Hospital Stay (HOSPITAL_COMMUNITY)
Admission: EM | Admit: 2018-12-25 | Discharge: 2018-12-28 | DRG: 184 | Disposition: A | Discharge status: Home Health | Payer: No Typology Code available for payment source | Attending: General Surgery | Admitting: General Surgery

## 2018-12-25 DIAGNOSIS — Z833 Family history of diabetes mellitus: Secondary | ICD-10-CM | POA: Diagnosis not present

## 2018-12-25 DIAGNOSIS — Z823 Family history of stroke: Secondary | ICD-10-CM | POA: Diagnosis not present

## 2018-12-25 DIAGNOSIS — Z885 Allergy status to narcotic agent status: Secondary | ICD-10-CM

## 2018-12-25 DIAGNOSIS — W109XXA Fall (on) (from) unspecified stairs and steps, initial encounter: Secondary | ICD-10-CM | POA: Diagnosis present

## 2018-12-25 DIAGNOSIS — Z7982 Long term (current) use of aspirin: Secondary | ICD-10-CM | POA: Diagnosis not present

## 2018-12-25 DIAGNOSIS — R000 Tachycardia, unspecified: Secondary | ICD-10-CM | POA: Diagnosis present

## 2018-12-25 DIAGNOSIS — K4020 Bilateral inguinal hernia, without obstruction or gangrene, not specified as recurrent: Secondary | ICD-10-CM | POA: Diagnosis present

## 2018-12-25 DIAGNOSIS — Z7902 Long term (current) use of antithrombotics/antiplatelets: Secondary | ICD-10-CM | POA: Diagnosis not present

## 2018-12-25 DIAGNOSIS — S2241XA Multiple fractures of ribs, right side, initial encounter for closed fracture: Secondary | ICD-10-CM

## 2018-12-25 DIAGNOSIS — Z1159 Encounter for screening for other viral diseases: Secondary | ICD-10-CM

## 2018-12-25 DIAGNOSIS — I10 Essential (primary) hypertension: Secondary | ICD-10-CM | POA: Diagnosis present

## 2018-12-25 DIAGNOSIS — R0902 Hypoxemia: Secondary | ICD-10-CM | POA: Diagnosis present

## 2018-12-25 DIAGNOSIS — Z8249 Family history of ischemic heart disease and other diseases of the circulatory system: Secondary | ICD-10-CM

## 2018-12-25 DIAGNOSIS — Y92410 Unspecified street and highway as the place of occurrence of the external cause: Secondary | ICD-10-CM

## 2018-12-25 DIAGNOSIS — J9811 Atelectasis: Secondary | ICD-10-CM | POA: Diagnosis not present

## 2018-12-25 DIAGNOSIS — R0602 Shortness of breath: Secondary | ICD-10-CM

## 2018-12-25 DIAGNOSIS — Z880 Allergy status to penicillin: Secondary | ICD-10-CM | POA: Diagnosis not present

## 2018-12-25 DIAGNOSIS — K219 Gastro-esophageal reflux disease without esophagitis: Secondary | ICD-10-CM | POA: Diagnosis present

## 2018-12-25 DIAGNOSIS — Z79899 Other long term (current) drug therapy: Secondary | ICD-10-CM

## 2018-12-25 DIAGNOSIS — F17210 Nicotine dependence, cigarettes, uncomplicated: Secondary | ICD-10-CM | POA: Diagnosis present

## 2018-12-25 DIAGNOSIS — T465X6A Underdosing of other antihypertensive drugs, initial encounter: Secondary | ICD-10-CM | POA: Diagnosis present

## 2018-12-25 DIAGNOSIS — S2239XA Fracture of one rib, unspecified side, initial encounter for closed fracture: Secondary | ICD-10-CM | POA: Diagnosis present

## 2018-12-25 DIAGNOSIS — Z8673 Personal history of transient ischemic attack (TIA), and cerebral infarction without residual deficits: Secondary | ICD-10-CM | POA: Diagnosis not present

## 2018-12-25 DIAGNOSIS — K4090 Unilateral inguinal hernia, without obstruction or gangrene, not specified as recurrent: Secondary | ICD-10-CM | POA: Diagnosis present

## 2018-12-25 DIAGNOSIS — K5900 Constipation, unspecified: Secondary | ICD-10-CM | POA: Diagnosis present

## 2018-12-25 DIAGNOSIS — S20211A Contusion of right front wall of thorax, initial encounter: Secondary | ICD-10-CM | POA: Diagnosis present

## 2018-12-25 DIAGNOSIS — F1010 Alcohol abuse, uncomplicated: Secondary | ICD-10-CM | POA: Diagnosis present

## 2018-12-25 DIAGNOSIS — S2249XA Multiple fractures of ribs, unspecified side, initial encounter for closed fracture: Secondary | ICD-10-CM | POA: Diagnosis present

## 2018-12-25 LAB — BASIC METABOLIC PANEL
Anion gap: 11 (ref 5–15)
BUN: 9 mg/dL (ref 8–23)
CO2: 22 mmol/L (ref 22–32)
Calcium: 8.7 mg/dL — ABNORMAL LOW (ref 8.9–10.3)
Chloride: 104 mmol/L (ref 98–111)
Creatinine, Ser: 0.92 mg/dL (ref 0.61–1.24)
GFR calc Af Amer: >60 mL/min (ref >60)
GFR calc non Af Amer: >60 mL/min (ref >60)
Glucose, Bld: 138 mg/dL — ABNORMAL HIGH (ref 70–99)
Potassium: 3.3 mmol/L — ABNORMAL LOW (ref 3.5–5.1)
Sodium: 137 mmol/L (ref 135–145)

## 2018-12-25 LAB — CBC WITH DIFFERENTIAL/PLATELET
Abs Immature Granulocytes: 0.04 10*3/uL (ref 0.00–0.07)
Basophils Absolute: 0 10*3/uL (ref 0.0–0.1)
Basophils Relative: 1 %
Eosinophils Absolute: 0.8 10*3/uL — ABNORMAL HIGH (ref 0.0–0.5)
Eosinophils Relative: 12 %
HCT: 39.6 % (ref 39.0–52.0)
Hemoglobin: 12.9 g/dL — ABNORMAL LOW (ref 13.0–17.0)
Immature Granulocytes: 1 %
Lymphocytes Relative: 24 %
Lymphs Abs: 1.6 10*3/uL (ref 0.7–4.0)
MCH: 29.1 pg (ref 26.0–34.0)
MCHC: 32.6 g/dL (ref 30.0–36.0)
MCV: 89.4 fL (ref 80.0–100.0)
Monocytes Absolute: 0.5 10*3/uL (ref 0.1–1.0)
Monocytes Relative: 8 %
Neutro Abs: 3.5 10*3/uL (ref 1.7–7.7)
Neutrophils Relative %: 54 %
Platelets: 188 10*3/uL (ref 150–400)
RBC: 4.43 MIL/uL (ref 4.22–5.81)
RDW: 15.7 % — ABNORMAL HIGH (ref 11.5–15.5)
WBC: 6.4 10*3/uL (ref 4.0–10.5)
nRBC: 0 % (ref 0.0–0.2)

## 2018-12-25 LAB — PROTIME-INR
INR: 1 (ref 0.8–1.2)
Prothrombin Time: 12.6 seconds (ref 11.4–15.2)

## 2018-12-25 LAB — ETHANOL: Alcohol, Ethyl (B): 145 mg/dL — ABNORMAL HIGH (ref <10)

## 2018-12-25 MED ORDER — LORAZEPAM 1 MG PO TABS: 1.0000 mg | ORAL_TABLET | Freq: Four times a day (QID) | ORAL | Status: AC | PRN

## 2018-12-25 MED ORDER — ALBUTEROL SULFATE HFA 108 (90 BASE) MCG/ACT IN AERS
2.0000 | INHALATION_SPRAY | Freq: Four times a day (QID) | RESPIRATORY_TRACT | Status: DC | PRN
  Administered 2018-12-26: 04:00:00 2 via RESPIRATORY_TRACT
  Filled 2018-12-25: qty 6.7

## 2018-12-25 MED ORDER — FENTANYL CITRATE (PF) 100 MCG/2ML IJ SOLN
100.0000 ug | Freq: Once | INTRAMUSCULAR | Status: AC
  Administered 2018-12-25: 100 ug via INTRAVENOUS
  Filled 2018-12-25: qty 2

## 2018-12-25 MED ORDER — ONDANSETRON HCL 4 MG/2ML IJ SOLN
4.0000 mg | Freq: Once | INTRAMUSCULAR | Status: AC
  Administered 2018-12-25: 4 mg via INTRAVENOUS
  Filled 2018-12-25: qty 2

## 2018-12-25 MED ORDER — FOLIC ACID 1 MG PO TABS
1.0000 mg | ORAL_TABLET | Freq: Every day | ORAL | Status: DC
  Administered 2018-12-25 – 2018-12-28 (×4): 1 mg via ORAL
  Filled 2018-12-25 (×4): qty 1

## 2018-12-25 MED ORDER — ASPIRIN 81 MG PO CHEW
81.0000 mg | CHEWABLE_TABLET | Freq: Every day | ORAL | Status: DC
  Administered 2018-12-25 – 2018-12-28 (×4): 81 mg via ORAL
  Filled 2018-12-25 (×4): qty 1

## 2018-12-25 MED ORDER — IOHEXOL 300 MG/ML  SOLN
100.0000 mL | Freq: Once | INTRAMUSCULAR | Status: AC | PRN
  Administered 2018-12-25: 100 mL via INTRAVENOUS

## 2018-12-25 MED ORDER — OXYCODONE HCL 5 MG PO TABS
10.0000 mg | ORAL_TABLET | ORAL | Status: DC | PRN
  Administered 2018-12-25 – 2018-12-28 (×13): 10 mg via ORAL
  Filled 2018-12-25 (×13): qty 2

## 2018-12-25 MED ORDER — ADULT MULTIVITAMIN W/MINERALS CH
1.0000 | ORAL_TABLET | Freq: Every day | ORAL | Status: DC
  Administered 2018-12-25 – 2018-12-28 (×4): 1 via ORAL
  Filled 2018-12-25 (×4): qty 1

## 2018-12-25 MED ORDER — VITAMIN B-1 100 MG PO TABS
100.0000 mg | ORAL_TABLET | Freq: Every day | ORAL | Status: DC
  Administered 2018-12-25 – 2018-12-28 (×4): 100 mg via ORAL
  Filled 2018-12-25 (×4): qty 1

## 2018-12-25 MED ORDER — SODIUM CHLORIDE 0.9 % IV SOLN: 250.0000 mL | INTRAVENOUS | Status: DC | PRN

## 2018-12-25 MED ORDER — NICOTINE 14 MG/24HR TD PT24
14.0000 mg | MEDICATED_PATCH | Freq: Every day | TRANSDERMAL | Status: DC
  Administered 2018-12-25 – 2018-12-28 (×4): 14 mg via TRANSDERMAL
  Filled 2018-12-25 (×4): qty 1

## 2018-12-25 MED ORDER — OXYCODONE HCL 5 MG PO TABS: 5.0000 mg | ORAL_TABLET | ORAL | Status: DC | PRN

## 2018-12-25 MED ORDER — LORAZEPAM 1 MG PO TABS: 0.0000 mg | ORAL_TABLET | Freq: Two times a day (BID) | ORAL | Status: DC

## 2018-12-25 MED ORDER — HYDROMORPHONE HCL 1 MG/ML IJ SOLN
1.0000 mg | Freq: Once | INTRAMUSCULAR | Status: AC
  Administered 2018-12-25: 1 mg via INTRAVENOUS
  Filled 2018-12-25: qty 1

## 2018-12-25 MED ORDER — HYDROMORPHONE HCL 1 MG/ML IJ SOLN
1.0000 mg | INTRAMUSCULAR | Status: DC | PRN
  Administered 2018-12-25 – 2018-12-27 (×8): 1 mg via INTRAVENOUS
  Filled 2018-12-25 (×8): qty 1

## 2018-12-25 MED ORDER — AMLODIPINE BESYLATE 5 MG PO TABS
5.0000 mg | ORAL_TABLET | Freq: Every day | ORAL | Status: DC
  Administered 2018-12-25 – 2018-12-28 (×4): 5 mg via ORAL
  Filled 2018-12-25 (×4): qty 1

## 2018-12-25 MED ORDER — KETOROLAC TROMETHAMINE 30 MG/ML IJ SOLN
30.0000 mg | Freq: Once | INTRAMUSCULAR | Status: AC
  Administered 2018-12-25: 30 mg via INTRAVENOUS
  Filled 2018-12-25: qty 1

## 2018-12-25 MED ORDER — ALBUTEROL SULFATE HFA 108 (90 BASE) MCG/ACT IN AERS
2.0000 | INHALATION_SPRAY | Freq: Once | RESPIRATORY_TRACT | Status: AC
  Administered 2018-12-25: 2 via RESPIRATORY_TRACT
  Filled 2018-12-25: qty 6.7

## 2018-12-25 MED ORDER — LORAZEPAM 2 MG/ML IJ SOLN
1.0000 mg | Freq: Four times a day (QID) | INTRAMUSCULAR | Status: AC | PRN
  Administered 2018-12-27: 1 mg via INTRAVENOUS
  Filled 2018-12-25: qty 1

## 2018-12-25 MED ORDER — FENTANYL CITRATE (PF) 100 MCG/2ML IJ SOLN
100.0000 ug | INTRAMUSCULAR | Status: DC | PRN
  Administered 2018-12-25: 100 ug via INTRAVENOUS
  Filled 2018-12-25: qty 2

## 2018-12-25 MED ORDER — SODIUM CHLORIDE 0.9% FLUSH: 3.0000 mL | INTRAVENOUS | Status: DC | PRN

## 2018-12-25 MED ORDER — ENOXAPARIN SODIUM 40 MG/0.4ML ~~LOC~~ SOLN
40.0000 mg | SUBCUTANEOUS | Status: DC
  Administered 2018-12-25 – 2018-12-27 (×3): 40 mg via SUBCUTANEOUS
  Filled 2018-12-25 (×4): qty 0.4

## 2018-12-25 MED ORDER — ONDANSETRON 4 MG PO TBDP: 4.0000 mg | ORAL_TABLET | Freq: Four times a day (QID) | ORAL | Status: DC | PRN

## 2018-12-25 MED ORDER — IPRATROPIUM-ALBUTEROL 0.5-2.5 (3) MG/3ML IN SOLN
3.0000 mL | Freq: Four times a day (QID) | RESPIRATORY_TRACT | Status: DC
  Administered 2018-12-25 (×3): 3 mL via RESPIRATORY_TRACT
  Filled 2018-12-25 (×3): qty 3

## 2018-12-25 MED ORDER — ONDANSETRON HCL 4 MG/2ML IJ SOLN: 4.0000 mg | Freq: Four times a day (QID) | INTRAMUSCULAR | Status: DC | PRN

## 2018-12-25 MED ORDER — GABAPENTIN 300 MG PO CAPS
300.0000 mg | ORAL_CAPSULE | Freq: Three times a day (TID) | ORAL | Status: DC
  Administered 2018-12-25 – 2018-12-28 (×11): 300 mg via ORAL
  Filled 2018-12-25 (×11): qty 1

## 2018-12-25 MED ORDER — THIAMINE HCL 100 MG/ML IJ SOLN
100.0000 mg | Freq: Every day | INTRAMUSCULAR | Status: DC
  Filled 2018-12-25 (×2): qty 2

## 2018-12-25 MED ORDER — AEROCHAMBER PLUS FLO-VU MISC
1.0000 | Freq: Once | Status: AC
  Administered 2018-12-25: 1
  Filled 2018-12-25: qty 1

## 2018-12-25 MED ORDER — DIPHENHYDRAMINE HCL 50 MG/ML IJ SOLN: 25.0000 mg | Freq: Four times a day (QID) | INTRAMUSCULAR | Status: DC | PRN

## 2018-12-25 MED ORDER — LORAZEPAM 1 MG PO TABS
0.0000 mg | ORAL_TABLET | Freq: Four times a day (QID) | ORAL | Status: AC
  Administered 2018-12-25: 4 mg via ORAL
  Administered 2018-12-26: 2 mg via ORAL
  Filled 2018-12-25 (×2): qty 1
  Filled 2018-12-25: qty 4
  Filled 2018-12-25: qty 1

## 2018-12-25 MED ORDER — AEROCHAMBER PLUS FLO-VU LARGE MISC
Status: AC
  Administered 2018-12-25: 04:00:00
  Filled 2018-12-25: qty 1

## 2018-12-25 MED ORDER — SODIUM CHLORIDE 0.9% FLUSH
3.0000 mL | Freq: Two times a day (BID) | INTRAVENOUS | Status: DC
  Administered 2018-12-25 – 2018-12-28 (×6): 3 mL via INTRAVENOUS

## 2018-12-25 NOTE — Discharge Instructions (Signed)
RIB FRACTURES  HOME INSTRUCTIONS   1. PAIN CONTROL:  1. Pain is best controlled by a usual combination of three different methods TOGETHER:  i. Ice/Heat ii. Over the counter pain medication iii. Prescription pain medication 2. You may experience some swelling and bruising in area of broken ribs. Ice packs or heating pads (30-60 minutes up to 6 times a day) will help. Use ice for the first few days to help decrease swelling and bruising, then switch to heat to help relax tight/sore spots and speed recovery. Some people prefer to use ice alone, heat alone, alternating between ice & heat. Experiment to what works for you. Swelling and bruising can take several weeks to resolve.  3. It is helpful to take an over-the-counter pain medication regularly for the first few weeks. Choose one of the following that works best for you:  i. Naproxen (Aleve, etc) Two 220mg tabs twice a day ii. Ibuprofen (Advil, etc) Three 200mg tabs four times a day (every meal & bedtime) iii. Acetaminophen (Tylenol, etc) 500-650mg four times a day (every meal & bedtime) 4. A prescription for pain medication (such as oxycodone, hydrocodone, etc) may be given to you upon discharge. Take your pain medication as prescribed.  i. If you are having problems/concerns with the prescription medicine (does not control pain, nausea, vomiting, rash, itching, etc), please call us [phone] to see if we need to switch you to a different pain medicine that will work better for you and/or control your side effect better. ii. If you need a refill on your pain medication, please contact your pharmacy. They will contact our office to request authorization. Prescriptions will not be filled after 5 pm or on week-ends. 1. Avoid getting constipated. When taking pain medications, it is common to experience some constipation. Increasing fluid intake and taking a fiber supplement (such as Metamucil, Citrucel, FiberCon, MiraLax, etc) 1-2 times a day  regularly will usually help prevent this problem from occurring. A mild laxative (prune juice, Milk of Magnesia, MiraLax, etc) should be taken according to package directions if there are no bowel movements after 48 hours.  2. Watch out for diarrhea. If you have many loose bowel movements, simplify your diet to bland foods & liquids for a few days. Stop any stool softeners and decrease your fiber supplement. Switching to mild anti-diarrheal medications (Kayopectate, Pepto Bismol) can help. If this worsens or does not improve, please call us. 3. FOLLOW UP  a. If a follow up appointment is needed one will be scheduled for you. If none is needed with our trauma team, please follow up with your primary care provider within 2-3 weeks from discharge. Please call [facility] at [phone] if you have any questions about follow up.  b. If you have any orthopedic or other injuries you will need to follow up as outlined in your follow up instructions.   WHEN TO CALL US [phone]:  1. Poor pain control 2. Reactions / problems with new medications (rash/itching, nausea, etc)  3. Fever over 101.5 F (38.5 C) 4. Worsening swelling or bruising 5. Worsening pain, productive cough, difficulty breathing or any other concerning symptoms  The clinic staff is available to answer your questions during regular business hours (8:30am-5pm). Please don't hesitate to call and ask to speak to one of our nurses for clinical concerns.  If you have a medical emergency, go to the nearest emergency room or call 911.  A surgeon from [facility] is always on call   at the hospitals   [facility]  [address], [address], [place], [place] [address] ?  MAIN: [phone] ? TOLL FREE: [phone] ?  FAX [phone]  [email or web address]      Information on Rib Fractures  A rib fracture is a break or crack in one of the bones of the ribs. The ribs are long, curved bones that  wrap around your chest and attach to your spine and your breastbone. The ribs protect your heart, lungs, and other organs in the chest. A broken or cracked rib is often painful but is not usually serious. Most rib fractures heal on their own over time. However, rib fractures can be more serious if multiple ribs are broken or if broken ribs move out of place and push against other structures or organs. What are the causes? This condition is caused by:  Repetitive movements with high force, such as pitching a baseball or having severe coughing spells.  A direct blow to the chest, such as a sports injury, a car accident, or a fall.  Cancer that has spread to the bones, which can weaken bones and cause them to break. What are the signs or symptoms? Symptoms of this condition include:  Pain when you breathe in or cough.  Pain when someone presses on the injured area.  Feeling short of breath. How is this diagnosed? This condition is diagnosed with a physical exam and medical history. Imaging tests may also be done, such as:  Chest X-ray.  CT scan.  MRI.  Bone scan.  Chest ultrasound. How is this treated? Treatment for this condition depends on the severity of the fracture. Most rib fractures usually heal on their own in 1-3 months. Sometimes healing takes longer if there is a cough that does not stop or if there are other activities that make the injury worse (aggravating factors). While you heal, you will be given medicines to control the pain. You will also be taught deep breathing exercises. Severe injuries may require hospitalization or surgery. Follow these instructions at home: Managing pain, stiffness, and swelling  If directed, apply ice to the injured area. ? Put ice in a plastic bag. ? Place a towel between your skin and the bag. ? Leave the ice on for 20 minutes, 2-3 times a day.  Take over-the-counter and prescription medicines only as told by your health care  provider. Activity  Avoid a lot of activity and any activities or movements that cause pain. Be careful during activities and avoid bumping the injured rib.  Slowly increase your activity as told by your health care provider. General instructions  Do deep breathing exercises as told by your health care provider. This helps prevent pneumonia, which is a common complication of a broken rib. Your health care provider may instruct you to: ? Take deep breaths several times a day. ? Try to cough several times a day, holding a pillow against the injured area. ? Use a device called incentive spirometer to practice deep breathing several times a day.  Drink enough fluid to keep your urine pale yellow.  Do not wear a rib belt or binder. These restrict breathing, which can lead to pneumonia.  Keep all follow-up visits as told by your health care provider. This is important. Contact a health care provider if:  You have a fever. Get help right away if:  You have difficulty breathing or you are short of breath.  You develop a cough that does   not stop, or you cough up thick or bloody sputum.  You have nausea, vomiting, or pain in your abdomen.  Your pain gets worse and medicine does not help. Summary  A rib fracture is a break or crack in one of the bones of the ribs.  A broken or cracked rib is often painful but is not usually serious.  Most rib fractures heal on their own over time.  Treatment for this condition depends on the severity of the fracture.  Avoid a lot of activity and any activities or movements that cause pain. This information is not intended to replace advice given to you by your health care provider. Make sure you discuss any questions you have with your health care provider. Document Released: 06/24/2005 Document Revised: 09/23/2016 Document Reviewed: 09/23/2016 Elsevier Interactive Patient Education  2019 [employer]  

## 2018-12-25 NOTE — ED Notes (Signed)
Breakfast tray ordered 

## 2018-12-25 NOTE — H&P (Signed)
[name] is an 63 y.o. male.   Chief Complaint: R rib pain after fall HPI: 63yo M was walking down some steps when he fell on to his R side. C/O R rib pain. He was evaluated in the ED as a non-trauma code activation. He was found to have R rib FX 10-11. He required O2 and had a lot of pain in the ED so we are asked to admit. He denies recent illness or sick contact. Reports he has not been taking his BP meds since he lost his job/insurance.  Past Medical History:  Diagnosis Date  . Acid reflux   . Alcohol abuse   . Arthritis    "left pinky" (10/17/2014)  . DDD (degenerative disc disease), lumbar   . Gastroenteritis   . History of stress test 2001   no ischemia  . Hypertension   . Tobacco abuse   . Vertebral compression fracture (HCC)    T11, L1    Past Surgical History:  Procedure Laterality Date  . COLON SURGERY    . EXPLORATORY LAPAROTOMY W/ BOWEL RESECTION  1980's    Family History  Problem Relation Age of Onset  . Diabetes Mother   . Hypertension Mother   . Stroke Father   . Heart failure Father   . Heart failure Other    Social History:  reports that he has been smoking cigarettes. He has a 13.50 pack-year smoking history. He has never used smokeless tobacco. He reports current alcohol use of about 64.0 standard drinks of alcohol per week. He reports that he does not use drugs.  Allergies:  Allergies  Allergen Reactions  . Penicillins Anaphylaxis    Has patient had a PCN reaction causing immediate rash, facial/tongue/throat swelling, SOB or lightheadedness with hypotension: Yes Has patient had a PCN reaction causing severe rash involving mucus membranes or skin necrosis: No Has patient had a PCN reaction that required hospitalization Yes Has patient had a PCN reaction occurring within the last 10 years: No If all of the above answers are "NO", then may proceed with Cephalosporin use.  Marland Kitchen Morphine And Related Itching    (Not in a hospital  admission)   Results for orders placed or performed during the hospital encounter of 12/25/18 (from the past 48 hour(s))  Basic metabolic panel     Status: Abnormal   Collection Time: 12/25/18  1:26 AM  Result Value Ref Range   Sodium 137 135 - 145 mmol/L   Potassium 3.3 (L) 3.5 - 5.1 mmol/L   Chloride 104 98 - 111 mmol/L   CO2 22 22 - 32 mmol/L   Glucose, Bld 138 (H) 70 - 99 mg/dL   BUN 9 8 - 23 mg/dL   Creatinine, Ser 0.92 0.61 - 1.24 mg/dL   Calcium 8.7 (L) 8.9 - 10.3 mg/dL   GFR calc non Af Amer >60 >60 mL/min   GFR calc Af Amer >60 >60 mL/min   Anion gap 11 5 - 15    Comment: Performed at [facility], [place] [address] [place], [place] [address]  CBC with Differential/Platelet     Status: Abnormal   Collection Time: 12/25/18  1:26 AM  Result Value Ref Range   WBC 6.4 4.0 - 10.5 K/uL   RBC 4.43 4.22 - 5.81 MIL/uL   Hemoglobin 12.9 (L) 13.0 - 17.0 g/dL   HCT 39.6 39.0 - 52.0 %   MCV 89.4 80.0 - 100.0 fL   MCH 29.1 26.0 - 34.0  pg   MCHC 32.6 30.0 - 36.0 g/dL   RDW 15.7 (H) 11.5 - 15.5 %   Platelets 188 150 - 400 K/uL   nRBC 0.0 0.0 - 0.2 %   Neutrophils Relative % 54 %   Neutro Abs 3.5 1.7 - 7.7 K/uL   Lymphocytes Relative 24 %   Lymphs Abs 1.6 0.7 - 4.0 K/uL   Monocytes Relative 8 %   Monocytes Absolute 0.5 0.1 - 1.0 K/uL   Eosinophils Relative 12 %   Eosinophils Absolute 0.8 (H) 0.0 - 0.5 K/uL   Basophils Relative 1 %   Basophils Absolute 0.0 0.0 - 0.1 K/uL   Immature Granulocytes 1 %   Abs Immature Granulocytes 0.04 0.00 - 0.07 K/uL    Comment: Performed at [facility] [address] [place], [place] [address]  Protime-INR     Status: None   Collection Time: 12/25/18  1:26 AM  Result Value Ref Range   Prothrombin Time 12.6 11.4 - 15.2 seconds   INR 1.0 0.8 - 1.2    Comment: (NOTE) INR goal varies based on device and disease states. Performed at [facility], [place] [address], [place], [place] [address]   Ethanol     Status: Abnormal    Collection Time: 12/25/18  1:26 AM  Result Value Ref Range   Alcohol, Ethyl (B) 145 (H) <10 mg/dL    Comment: (NOTE) Lowest detectable limit for serum alcohol is 10 mg/dL. For medical purposes only. Performed at [facility], [place] [address] [place], [place] [address]    Ct Head Wo Contrast  Result Date: 12/25/2018 CLINICAL DATA:  63 y/o M; fall backwards impact on the right side. Severe right rib pain. Difficulty breathing. Head trauma. EXAM: CT HEAD WITHOUT CONTRAST CT CERVICAL SPINE WITHOUT CONTRAST TECHNIQUE: Multidetector CT imaging of the head and cervical spine was performed following the standard protocol without intravenous contrast. Multiplanar CT image reconstructions of the cervical spine were also generated. COMPARISON:  04/08/2018 MRI head and CT head. 06/18/2013 CT head and cervical spine. FINDINGS: CT HEAD FINDINGS Brain: No evidence of acute infarction, hemorrhage, hydrocephalus, extra-axial collection or mass lesion/mass effect. Vascular: Calcific atherosclerosis of the internal carotid arteries. Skull: Normal. Negative for fracture or focal lesion. Sinuses/Orbits: No acute finding. Other: None. CT CERVICAL SPINE FINDINGS Alignment: Straightening of cervical lordosis without listhesis. Skull base and vertebrae: Extensive motion artifact throughout the cervical spine. No gross displaced fracture identified. A nondisplaced fracture may be obscured by the motion artifact. Soft tissues and spinal canal: No prevertebral fluid or swelling. No visible canal hematoma. Disc levels:  Negative. Upper chest: Please refer to the concurrent CT of chest. Other: Negative. IMPRESSION: 1. No acute intracranial abnormality or displaced calvarial fracture. Unremarkable CT of the head. 2. Extensive motion artifact throughout the cervical spine. No gross displaced fracture identified. A nondisplaced fracture may be obscured by motion artifact. No secondary signs of cervical injury identified.  Electronically Signed   By: [name] M.D.   On: 12/25/2018 02:45   Ct Chest W Contrast  Result Date: 12/25/2018 CLINICAL DATA:  Fall off one-step stair falling backwards landing on right side. Right rib pain and difficulty breathing. EXAM: CT CHEST, ABDOMEN, AND PELVIS WITH CONTRAST TECHNIQUE: Multidetector CT imaging of the chest, abdomen and pelvis was performed following the standard protocol during bolus administration of intravenous contrast. CONTRAST:  143mL OMNIPAQUE IOHEXOL 300 MG/ML  SOLN COMPARISON:  Radiograph earlier this day. Abdominal CT 05/30/2018.  Chest CT 10/16/2014 FINDINGS: CT CHEST FINDINGS Cardiovascular: No vascular injury. Normal heart size. No pericardial effusion. There are coronary artery calcifications. Mediastinum/Nodes: No mediastinal hemorrhage or hematoma. No pneumomediastinum. No adenopathy. Esophagus is mildly patulous, no wall thickening. No visualized thyroid nodule. Lungs/Pleura: No pneumothorax. No pulmonary contusion. Mild emphysema. Mild right pleural thickening without frank effusion. Atelectasis in the dependent right lower lobe. Blunting of the left costophrenic angle due to prominent epicardial fat pad. Linear lingular and left lower lobe opacities. No pulmonary edema. No pulmonary mass. Musculoskeletal: Minimally displaced fractures of right lateral tenth and eleventh ribs. Tiny focus of subcutaneous gas adjacent to the tenth rib fracture. No left rib fracture. No fracture of the sternum, included clavicles or shoulder girdles. Mild anterior wedging of multiple thoracic vertebral bodies, chronic and unchanged from prior. Patchy subcutaneous edema in the right posterior flank. CT ABDOMEN PELVIS FINDINGS Hepatobiliary: No hepatic injury or perihepatic hematoma. Scattered cysts throughout the liver are again seen and unchanged from prior. Gallbladder is unremarkable. Pancreas: No evidence of injury. No ductal dilatation or inflammation. Spleen: No splenic  injury or perisplenic hematoma. Adrenals/Urinary Tract: No adrenal hemorrhage or renal injury identified. Simple cyst in the upper left kidney again seen. Dome of the urinary bladder extends into the right inguinal canal with associated bladder wall thickening, the degree of wall thickening has improved from prior exam. Stomach/Bowel: No evidence of bowel injury. No mesenteric hematoma. Left inguinal hernia contains sigmoid colon. Mild colonic diverticulosis without diverticulitis. Submucosal fatty infiltration throughout the colon consistent with prior/chronic inflammation. Patulous loop of small bowel in the left upper quadrant as before. No acute bowel inflammation. Normal appendix. Vascular/Lymphatic: No vascular injury. The abdominal aorta and IVC are intact. No retroperitoneal fluid. Portal vein is patent. Mild aortic atherosclerosis. No adenopathy. Reproductive: Prominent prostate gland as before. Other: Patchy subcutaneous edema in the right posterior flank at the site of rib fractures. No other body wall contusion. No free air or free fluid in the abdomen or pelvis. Fat containing umbilical hernia. Right inguinal hernia containing bladder dome. Left inguinal hernia containing sigmoid colon. Musculoskeletal: No acute fracture of the lumbar spine or pelvis. Mild chronic loss of height of L1 vertebral body. IMPRESSION: 1. Minimally displaced fractures of right lateral tenth and eleventh ribs. No pneumothorax or pulmonary complication. 2. Subcutaneous edema in the right posterior flank at the site of rib fractures. No additional acute traumatic injury to the chest, abdomen, or pelvis. 3. Bilateral inguinal hernias. The left inguinal hernia now contains nonobstructed sigmoid colon. Right inguinal hernia contains bladder dome, unchanged from prior exam. Decreased bladder wall thickening from most recent prior. 4. Aortic Atherosclerosis (ICD10-I70.0) and Emphysema (ICD10-J43.9). Electronically Signed   By: Keith Rake M.D.   On: 12/25/2018 02:58   Ct Cervical Spine Wo Contrast  Result Date: 12/25/2018 CLINICAL DATA:  63 y/o M; fall backwards impact on the right side. Severe right rib pain. Difficulty breathing. Head trauma. EXAM: CT HEAD WITHOUT CONTRAST CT CERVICAL SPINE WITHOUT CONTRAST TECHNIQUE: Multidetector CT imaging of the head and cervical spine was performed following the standard protocol without intravenous contrast. Multiplanar CT image reconstructions of the cervical spine were also generated. COMPARISON:  04/08/2018 MRI head and CT head. 06/18/2013 CT head and cervical spine. FINDINGS: CT HEAD FINDINGS Brain: No evidence of acute infarction, hemorrhage, hydrocephalus, extra-axial collection or mass lesion/mass effect. Vascular: Calcific atherosclerosis of the internal carotid arteries. Skull: Normal. Negative for fracture or focal lesion. Sinuses/Orbits: No acute finding. Other: None. CT  CERVICAL SPINE FINDINGS Alignment: Straightening of cervical lordosis without listhesis. Skull base and vertebrae: Extensive motion artifact throughout the cervical spine. No gross displaced fracture identified. A nondisplaced fracture may be obscured by the motion artifact. Soft tissues and spinal canal: No prevertebral fluid or swelling. No visible canal hematoma. Disc levels:  Negative. Upper chest: Please refer to the concurrent CT of chest. Other: Negative. IMPRESSION: 1. No acute intracranial abnormality or displaced calvarial fracture. Unremarkable CT of the head. 2. Extensive motion artifact throughout the cervical spine. No gross displaced fracture identified. A nondisplaced fracture may be obscured by motion artifact. No secondary signs of cervical injury identified. Electronically Signed   By: [name] M.D.   On: 12/25/2018 02:45   Ct Abdomen Pelvis W Contrast  Result Date: 12/25/2018 CLINICAL DATA:  Fall off one-step stair falling backwards landing on right side. Right rib pain and  difficulty breathing. EXAM: CT CHEST, ABDOMEN, AND PELVIS WITH CONTRAST TECHNIQUE: Multidetector CT imaging of the chest, abdomen and pelvis was performed following the standard protocol during bolus administration of intravenous contrast. CONTRAST:  170mL OMNIPAQUE IOHEXOL 300 MG/ML  SOLN COMPARISON:  Radiograph earlier this day. Abdominal CT 05/30/2018. Chest CT 10/16/2014 FINDINGS: CT CHEST FINDINGS Cardiovascular: No vascular injury. Normal heart size. No pericardial effusion. There are coronary artery calcifications. Mediastinum/Nodes: No mediastinal hemorrhage or hematoma. No pneumomediastinum. No adenopathy. Esophagus is mildly patulous, no wall thickening. No visualized thyroid nodule. Lungs/Pleura: No pneumothorax. No pulmonary contusion. Mild emphysema. Mild right pleural thickening without frank effusion. Atelectasis in the dependent right lower lobe. Blunting of the left costophrenic angle due to prominent epicardial fat pad. Linear lingular and left lower lobe opacities. No pulmonary edema. No pulmonary mass. Musculoskeletal: Minimally displaced fractures of right lateral tenth and eleventh ribs. Tiny focus of subcutaneous gas adjacent to the tenth rib fracture. No left rib fracture. No fracture of the sternum, included clavicles or shoulder girdles. Mild anterior wedging of multiple thoracic vertebral bodies, chronic and unchanged from prior. Patchy subcutaneous edema in the right posterior flank. CT ABDOMEN PELVIS FINDINGS Hepatobiliary: No hepatic injury or perihepatic hematoma. Scattered cysts throughout the liver are again seen and unchanged from prior. Gallbladder is unremarkable. Pancreas: No evidence of injury. No ductal dilatation or inflammation. Spleen: No splenic injury or perisplenic hematoma. Adrenals/Urinary Tract: No adrenal hemorrhage or renal injury identified. Simple cyst in the upper left kidney again seen. Dome of the urinary bladder extends into the right inguinal canal with  associated bladder wall thickening, the degree of wall thickening has improved from prior exam. Stomach/Bowel: No evidence of bowel injury. No mesenteric hematoma. Left inguinal hernia contains sigmoid colon. Mild colonic diverticulosis without diverticulitis. Submucosal fatty infiltration throughout the colon consistent with prior/chronic inflammation. Patulous loop of small bowel in the left upper quadrant as before. No acute bowel inflammation. Normal appendix. Vascular/Lymphatic: No vascular injury. The abdominal aorta and IVC are intact. No retroperitoneal fluid. Portal vein is patent. Mild aortic atherosclerosis. No adenopathy. Reproductive: Prominent prostate gland as before. Other: Patchy subcutaneous edema in the right posterior flank at the site of rib fractures. No other body wall contusion. No free air or free fluid in the abdomen or pelvis. Fat containing umbilical hernia. Right inguinal hernia containing bladder dome. Left inguinal hernia containing sigmoid colon. Musculoskeletal: No acute fracture of the lumbar spine or pelvis. Mild chronic loss of height of L1 vertebral body. IMPRESSION: 1. Minimally displaced fractures of right lateral tenth and eleventh ribs. No pneumothorax or pulmonary complication. 2. Subcutaneous  edema in the right posterior flank at the site of rib fractures. No additional acute traumatic injury to the chest, abdomen, or pelvis. 3. Bilateral inguinal hernias. The left inguinal hernia now contains nonobstructed sigmoid colon. Right inguinal hernia contains bladder dome, unchanged from prior exam. Decreased bladder wall thickening from most recent prior. 4. Aortic Atherosclerosis (ICD10-I70.0) and Emphysema (ICD10-J43.9). Electronically Signed   By: Keith Rake M.D.   On: 12/25/2018 02:58   Dg Chest Port 1 View  Result Date: 12/25/2018 CLINICAL DATA:  Pain. Patient reports fall off stairs. Right rib pain and difficulty breathing. EXAM: PORTABLE CHEST 1 VIEW COMPARISON:   Chest radiograph 08/22/2017 FINDINGS: Low lung volumes. Blunting of left costophrenic angle with adjacent streaky opacity. No pneumothorax. Normal heart size and mediastinal contours. No evidence of rib fracture or acute osseous abnormality. IMPRESSION: 1. Low lung volumes. Minimal streaky left lung base opacity and blunting of costophrenic angle, possible small effusion. 2. No visualized rib fracture. Electronically Signed   By: Keith Rake M.D.   On: 12/25/2018 01:30    Review of Systems  Constitutional: Negative for chills and fever.  HENT: Negative.   Eyes: Negative.   Respiratory: Positive for wheezing.   Cardiovascular: Positive for chest pain.  Gastrointestinal: Positive for vomiting. Negative for abdominal pain, diarrhea and nausea.       Vomited right after he fell  Genitourinary: Negative.   Musculoskeletal: Positive for back pain.  Skin: Negative.   Neurological: Negative.   Endo/Heme/Allergies: Negative.   Psychiatric/Behavioral: Negative.     Blood pressure (!) 153/98, pulse 97, temperature 98.2 F (36.8 C), temperature source Oral, resp. rate 13, height 5\' 11"  (1.803 m), weight 100.2 kg, SpO2 95 %. Physical Exam  Constitutional: He is oriented to person, place, and time. He appears well-developed and well-nourished. No distress.  HENT:  Head: Normocephalic.  Right Ear: External ear normal.  Left Ear: External ear normal.  Mouth/Throat: Oropharynx is clear and moist.  Eyes: Pupils are equal, round, and reactive to light. EOM are normal. Right eye exhibits no discharge. Left eye exhibits no discharge.  Neck: Neck supple. No tracheal deviation present. No thyromegaly present.  Cardiovascular: Normal rate, regular rhythm and normal heart sounds.  Respiratory: Effort normal. No respiratory distress. He has wheezes. He has no rales. He exhibits no tenderness.  Wheeze B  GI: Soft. He exhibits no distension. There is no abdominal tenderness. There is no rebound and no  guarding.  bilateral inguinal hernias   Musculoskeletal: Normal range of motion.        General: No edema.  Neurological: He is alert and oriented to person, place, and time. He displays no atrophy and no tremor. He exhibits normal muscle tone. He displays no seizure activity. GCS eye subscore is 4. GCS verbal subscore is 5. GCS motor subscore is 6.  Skin: Skin is warm.  Psychiatric: He has a normal mood and affect.     Assessment/Plan Fall R rib FX 10-11 ETOH - CIWA BIH - chronic HTN - home Norvasc  Admit for multimodal pain control, pulmonary toilet, bronchodilators, PT/OT  Zenovia Jarred, MD 12/25/2018, 7:49 AM

## 2018-12-25 NOTE — Evaluation (Signed)
Physical Therapy Evaluation Patient Details Name: [name] MRN: [id number] DOB: [birth year] Today's Date: 12/25/2018   History of Present Illness  Pt is a 63 y/o male admitted after fall down steps. Found to have R 10-11 rib fractures. PMH includes alcohol abuse and HTN.   Clinical Impression  Pt admitted secondary to problem above with deficits below. Pt limited this session secondary to pain and SOB. Oxygen sats ranging from 90-92% on 2L during mobility tasks. Reviewed incentive spirometry use and importance of mobility following rib fractures. Feel pt will progress well once pain controlled. Will continue to follow acutely to maximize functional mobility independence and safety.     Follow Up Recommendations No PT follow up;Supervision for mobility/OOB    Equipment Recommendations  Other (comment)(cane vs RW)    Recommendations for Other Services       Precautions / Restrictions Precautions Precautions: Fall;Other (comment) Precaution Comments: watch O2 Restrictions Weight Bearing Restrictions: No      Mobility  Bed Mobility Overal bed mobility: Needs Assistance Bed Mobility: Supine to Sit;Sit to Supine     Supine to sit: Supervision;HOB elevated Sit to supine: Supervision;HOB elevated   General bed mobility comments: Supervision for safety. Increased time required to come to EOB.   Transfers Overall transfer level: Needs assistance Equipment used: 1 person hand held assist Transfers: Sit to/from Stand Sit to Stand: Min guard         General transfer comment: Min guard for steadying assist. Increased time and effort required secondary to pain.  Ambulation/Gait Ambulation/Gait assistance: Min guard Gait Distance (Feet): 20 Feet Assistive device: 1 person hand held assist Gait Pattern/deviations: Step-through pattern;Decreased stride length Gait velocity: Decreased   General Gait Details: Slow, very guarded gait. Increased SOB noted. Oxygen sats  at 90% and above on 2L. Distance limited secondary to pain.   Stairs            Wheelchair Mobility    Modified Rankin (Stroke Patients Only)       Balance Overall balance assessment: Needs assistance Sitting-balance support: No upper extremity supported;Feet supported Sitting balance-Leahy Scale: Fair     Standing balance support: Single extremity supported;No upper extremity supported;During functional activity Standing balance-Leahy Scale: Fair Standing balance comment: able to maintain static standing without UE support                              Pertinent Vitals/Pain Pain Assessment: Faces Faces Pain Scale: Hurts whole lot Pain Location: R ribs Pain Descriptors / Indicators: Grimacing;Guarding Pain Intervention(s): Limited activity within patient's tolerance;Monitored during session;Repositioned    Home Living Family/patient expects to be discharged to:: Private residence Living Arrangements: Spouse/significant other Available Help at Discharge: Family;Available 24 hours/day Type of Home: House Home Access: Stairs to enter Entrance Stairs-Rails: Right;Left;Can reach both Entrance Stairs-Number of Steps: 2 Home Layout: One level Home Equipment: None      Prior Function Level of Independence: Independent               Hand Dominance        Extremity/Trunk Assessment   Upper Extremity Assessment Upper Extremity Assessment: Defer to OT evaluation    Lower Extremity Assessment Lower Extremity Assessment: Overall WFL for tasks assessed    Cervical / Trunk Assessment Cervical / Trunk Assessment: Other exceptions Cervical / Trunk Exceptions: R rib fxs  Communication   Communication: No difficulties  Cognition Arousal/Alertness: Awake/alert Behavior During Therapy: WFL for tasks assessed/performed  Overall Cognitive Status: Within Functional Limits for tasks assessed                                         General Comments General comments (skin integrity, edema, etc.): Reviewed importance of incentive spirometry use and reviewed frequency to perform throughout the day.     Exercises     Assessment/Plan    PT Assessment Patient needs continued PT services  PT Problem List Decreased strength;Decreased activity tolerance;Decreased mobility;Decreased knowledge of use of DME;Decreased knowledge of precautions;Pain       PT Treatment Interventions DME instruction;Gait training;Stair training;Functional mobility training;Therapeutic activities;Therapeutic exercise;Balance training;Patient/family education    PT Goals (Current goals can be found in the Care Plan section)  Acute Rehab PT Goals Patient Stated Goal: to decrease pain  PT Goal Formulation: With patient Time For Goal Achievement: 01/08/19 Potential to Achieve Goals: Good    Frequency Min 3X/week   Barriers to discharge        Co-evaluation               AM-PAC PT "6 Clicks" Mobility  Outcome Measure Help needed turning from your back to your side while in a flat bed without using bedrails?: A Little Help needed moving from lying on your back to sitting on the side of a flat bed without using bedrails?: A Little Help needed moving to and from a bed to a chair (including a wheelchair)?: A Little Help needed standing up from a chair using your arms (e.g., wheelchair or bedside chair)?: A Little Help needed to walk in hospital room?: A Little Help needed climbing 3-5 steps with a railing? : A Lot 6 Click Score: 17    End of Session   Activity Tolerance: Patient limited by pain Patient left: in bed;with call bell/phone within reach;with bed alarm set Nurse Communication: Mobility status PT Visit Diagnosis: Difficulty in walking, not elsewhere classified (R26.2);Pain Pain - Right/Left: Right Pain - part of body: (ris)    Time: 0175-1025 PT Time Calculation (min) (ACUTE ONLY): 21 min   Charges:   PT  Evaluation $PT Eval Moderate Complexity: 1 Mod          Leighton Ruff, PT, DPT  Acute Rehabilitation Services  Pager: [phone] Office: [phone]   [name] 12/25/2018, 3:29 PM

## 2018-12-25 NOTE — ED Notes (Signed)
Patient transported to CT 

## 2018-12-25 NOTE — ED Provider Notes (Signed)
[facility] EMERGENCY DEPARTMENT Provider Note   CSN: [id number] Arrival date & time:        History   Chief Complaint Chief Complaint  Patient presents with  . Fall    HPI [name] is a 63 y.o. male.     The history is provided by the patient.  Fall This is a new problem. Episode onset: just prior to arrival. The problem occurs constantly. The problem has been rapidly worsening. Associated symptoms include chest pain and shortness of breath. Exacerbated by: breathing. Nothing relieves the symptoms. Treatments tried: IV narcotics. The treatment provided no relief.  patient presents after accidental fall Pt reports falling off 1 step and landing on right side Denies LOC Admits to taking 3 shots of liquor He reports significant pain with breathing   Past Medical History:  Diagnosis Date  . Acid reflux   . Alcohol abuse   . Arthritis    "left pinky" (10/17/2014)  . DDD (degenerative disc disease), lumbar   . Gastroenteritis   . History of stress test 2001   no ischemia  . Hypertension   . Tobacco abuse   . Vertebral compression fracture (HCC)    T11, L1    Patient Active Problem List   Diagnosis Date Noted  . Acute CVA (cerebrovascular accident) ([place]) 04/08/2018  . TIA (transient ischemic attack) 08/22/2017  . Bilateral recurrent inguinal hernia without obstruction or gangrene   . Influenza 10/18/2014  . Hypoxia 10/16/2014  . SOB (shortness of breath) 10/16/2014  . Cough 10/16/2014  . Fever 10/16/2014  . Sepsis ([place]) 10/16/2014  . Nausea and vomiting 10/16/2014  . Acid reflux   . DDD (degenerative disc disease), lumbar   . Hypertension   . Tobacco abuse   . Alcohol abuse   . Essential hypertension     Past Surgical History:  Procedure Laterality Date  . COLON SURGERY    . EXPLORATORY LAPAROTOMY W/ BOWEL RESECTION  1980's        Home Medications    Prior to Admission medications   Medication Sig Start Date End  Date Taking? Authorizing Provider  albuterol (PROVENTIL HFA;VENTOLIN HFA) 108 (90 Base) MCG/ACT inhaler Inhale 2 puffs into the lungs every 6 (six) hours as needed for wheezing or shortness of breath. 07/12/17   Johnn Hai, [name]  amLODipine (NORVASC) 5 MG tablet Take 1 tablet (5 mg total) by mouth daily. 04/09/18 07/08/18  Gladstone Lighter, MD  aspirin 81 MG EC tablet Take 1 tablet (81 mg total) by mouth daily. 04/09/18   Gladstone Lighter, MD  atorvastatin (LIPITOR) 10 MG tablet Take 1 tablet (10 mg total) by mouth daily. 04/09/18 07/08/18  Gladstone Lighter, MD  clopidogrel (PLAVIX) 75 MG tablet Take 1 tablet (75 mg total) by mouth daily. 04/09/18 04/09/19  Gladstone Lighter, MD  omeprazole (PRILOSEC) 20 MG capsule Take 20 mg by mouth daily.    [provider]    Family History Family History  Problem Relation Age of Onset  . Diabetes Mother   . Hypertension Mother   . Stroke Father   . Heart failure Father   . Heart failure Other     Social History Social History   Tobacco Use  . Smoking status: Current Every Day Smoker    Packs/day: 0.50    Years: 27.00    Pack years: 13.50    Types: Cigarettes  . Smokeless tobacco: Never Used  Substance Use Topics  . Alcohol use: Yes  Alcohol/week: 64.0 standard drinks    Types: 15 Cans of beer, 44 Shots of liquor, 5 Standard drinks or equivalent per week    Comment: 4/11/23016 "I drink none to 1 pint of gin/day; 40oz beer when I'm drinking; occasional wine; probably 4 pints/wk; 4, 40oz beers/wk"  . Drug use: No    Types: Marijuana    Comment: 10/17/2014 "last marijuana was in the 1990's"     Allergies   Penicillins and Morphine and related   Review of Systems Review of Systems  Respiratory: Positive for shortness of breath.   Cardiovascular: Positive for chest pain.  Gastrointestinal: Negative for vomiting.  All other systems reviewed and are negative.    Physical Exam Updated Vital Signs BP (!) 160/100 (BP  Location: Right Arm)   Pulse (!) 108   Temp 98.2 F (36.8 C) (Oral)   Resp (!) 24   Ht 1.803 m (5\' 11" )   Wt 100.2 kg   SpO2 97%   BMI 30.82 kg/m   Physical Exam CONSTITUTIONAL: Well developed/well nourished, anxious HEAD: Normocephalic/atraumatic EYES: EOMI/PERRL ENMT: Mucous membranes moist, no facial trauma NECK: supple no meningeal signs SPINE/BACK:entire spine nontender CV: S1/S2 noted, no murmurs/rubs/gallops noted LUNGS: Lungs are clear to auscultation bilaterally, no apparent distress, tachypneic Chest - diffuse tenderness to posterior right chest, no crepitus ABDOMEN: soft, nontender, no RUQ tenderness no rebound or guarding, bowel sounds noted throughout abdomen WU:[id number] cva tenderness, no bruising note NEURO: Pt is awake/alert/appropriate, moves all extremitiesx4.  No facial droop.  GCS 15 EXTREMITIES: pulses normal/equal, full ROM, All other extremities/joints palpated/ranged and nontender SKIN: warm, color normal PSYCH: anxious   ED Treatments / Results  Labs (all labs ordered are listed, but only abnormal results are displayed) Labs Reviewed  BASIC METABOLIC PANEL - Abnormal; Notable for the following components:      Result Value   Potassium 3.3 (*)    Glucose, Bld 138 (*)    Calcium 8.7 (*)    All other components within normal limits  CBC WITH DIFFERENTIAL/PLATELET - Abnormal; Notable for the following components:   Hemoglobin 12.9 (*)    RDW 15.7 (*)    Eosinophils Absolute 0.8 (*)    All other components within normal limits  ETHANOL - Abnormal; Notable for the following components:   Alcohol, Ethyl (B) 145 (*)    All other components within normal limits  NOVEL CORONAVIRUS, NAA (HOSPITAL ORDER, SEND-OUT TO REF LAB)  PROTIME-INR    EKG    Radiology Ct Head Wo Contrast  Result Date: 12/25/2018 CLINICAL DATA:  63 y/o M; fall backwards impact on the right side. Severe right rib pain. Difficulty breathing. Head trauma. EXAM: CT HEAD WITHOUT  CONTRAST CT CERVICAL SPINE WITHOUT CONTRAST TECHNIQUE: Multidetector CT imaging of the head and cervical spine was performed following the standard protocol without intravenous contrast. Multiplanar CT image reconstructions of the cervical spine were also generated. COMPARISON:  04/08/2018 MRI head and CT head. 06/18/2013 CT head and cervical spine. FINDINGS: CT HEAD FINDINGS Brain: No evidence of acute infarction, hemorrhage, hydrocephalus, extra-axial collection or mass lesion/mass effect. Vascular: Calcific atherosclerosis of the internal carotid arteries. Skull: Normal. Negative for fracture or focal lesion. Sinuses/Orbits: No acute finding. Other: None. CT CERVICAL SPINE FINDINGS Alignment: Straightening of cervical lordosis without listhesis. Skull base and vertebrae: Extensive motion artifact throughout the cervical spine. No gross displaced fracture identified. A nondisplaced fracture may be obscured by the motion artifact. Soft tissues and spinal canal: No prevertebral fluid or swelling. No  visible canal hematoma. Disc levels:  Negative. Upper chest: Please refer to the concurrent CT of chest. Other: Negative. IMPRESSION: 1. No acute intracranial abnormality or displaced calvarial fracture. Unremarkable CT of the head. 2. Extensive motion artifact throughout the cervical spine. No gross displaced fracture identified. A nondisplaced fracture may be obscured by motion artifact. No secondary signs of cervical injury identified. Electronically Signed   By: [name] M.D.   On: 12/25/2018 02:45   Ct Chest W Contrast  Result Date: 12/25/2018 CLINICAL DATA:  Fall off one-step stair falling backwards landing on right side. Right rib pain and difficulty breathing. EXAM: CT CHEST, ABDOMEN, AND PELVIS WITH CONTRAST TECHNIQUE: Multidetector CT imaging of the chest, abdomen and pelvis was performed following the standard protocol during bolus administration of intravenous contrast. CONTRAST:  185mL  OMNIPAQUE IOHEXOL 300 MG/ML  SOLN COMPARISON:  Radiograph earlier this day. Abdominal CT 05/30/2018. Chest CT 10/16/2014 FINDINGS: CT CHEST FINDINGS Cardiovascular: No vascular injury. Normal heart size. No pericardial effusion. There are coronary artery calcifications. Mediastinum/Nodes: No mediastinal hemorrhage or hematoma. No pneumomediastinum. No adenopathy. Esophagus is mildly patulous, no wall thickening. No visualized thyroid nodule. Lungs/Pleura: No pneumothorax. No pulmonary contusion. Mild emphysema. Mild right pleural thickening without frank effusion. Atelectasis in the dependent right lower lobe. Blunting of the left costophrenic angle due to prominent epicardial fat pad. Linear lingular and left lower lobe opacities. No pulmonary edema. No pulmonary mass. Musculoskeletal: Minimally displaced fractures of right lateral tenth and eleventh ribs. Tiny focus of subcutaneous gas adjacent to the tenth rib fracture. No left rib fracture. No fracture of the sternum, included clavicles or shoulder girdles. Mild anterior wedging of multiple thoracic vertebral bodies, chronic and unchanged from prior. Patchy subcutaneous edema in the right posterior flank. CT ABDOMEN PELVIS FINDINGS Hepatobiliary: No hepatic injury or perihepatic hematoma. Scattered cysts throughout the liver are again seen and unchanged from prior. Gallbladder is unremarkable. Pancreas: No evidence of injury. No ductal dilatation or inflammation. Spleen: No splenic injury or perisplenic hematoma. Adrenals/Urinary Tract: No adrenal hemorrhage or renal injury identified. Simple cyst in the upper left kidney again seen. Dome of the urinary bladder extends into the right inguinal canal with associated bladder wall thickening, the degree of wall thickening has improved from prior exam. Stomach/Bowel: No evidence of bowel injury. No mesenteric hematoma. Left inguinal hernia contains sigmoid colon. Mild colonic diverticulosis without diverticulitis.  Submucosal fatty infiltration throughout the colon consistent with prior/chronic inflammation. Patulous loop of small bowel in the left upper quadrant as before. No acute bowel inflammation. Normal appendix. Vascular/Lymphatic: No vascular injury. The abdominal aorta and IVC are intact. No retroperitoneal fluid. Portal vein is patent. Mild aortic atherosclerosis. No adenopathy. Reproductive: Prominent prostate gland as before. Other: Patchy subcutaneous edema in the right posterior flank at the site of rib fractures. No other body wall contusion. No free air or free fluid in the abdomen or pelvis. Fat containing umbilical hernia. Right inguinal hernia containing bladder dome. Left inguinal hernia containing sigmoid colon. Musculoskeletal: No acute fracture of the lumbar spine or pelvis. Mild chronic loss of height of L1 vertebral body. IMPRESSION: 1. Minimally displaced fractures of right lateral tenth and eleventh ribs. No pneumothorax or pulmonary complication. 2. Subcutaneous edema in the right posterior flank at the site of rib fractures. No additional acute traumatic injury to the chest, abdomen, or pelvis. 3. Bilateral inguinal hernias. The left inguinal hernia now contains nonobstructed sigmoid colon. Right inguinal hernia contains bladder dome, unchanged from prior exam. Decreased bladder  wall thickening from most recent prior. 4. Aortic Atherosclerosis (ICD10-I70.0) and Emphysema (ICD10-J43.9). Electronically Signed   By: Keith Rake M.D.   On: 12/25/2018 02:58   Ct Cervical Spine Wo Contrast  Result Date: 12/25/2018 CLINICAL DATA:  63 y/o M; fall backwards impact on the right side. Severe right rib pain. Difficulty breathing. Head trauma. EXAM: CT HEAD WITHOUT CONTRAST CT CERVICAL SPINE WITHOUT CONTRAST TECHNIQUE: Multidetector CT imaging of the head and cervical spine was performed following the standard protocol without intravenous contrast. Multiplanar CT image reconstructions of the cervical  spine were also generated. COMPARISON:  04/08/2018 MRI head and CT head. 06/18/2013 CT head and cervical spine. FINDINGS: CT HEAD FINDINGS Brain: No evidence of acute infarction, hemorrhage, hydrocephalus, extra-axial collection or mass lesion/mass effect. Vascular: Calcific atherosclerosis of the internal carotid arteries. Skull: Normal. Negative for fracture or focal lesion. Sinuses/Orbits: No acute finding. Other: None. CT CERVICAL SPINE FINDINGS Alignment: Straightening of cervical lordosis without listhesis. Skull base and vertebrae: Extensive motion artifact throughout the cervical spine. No gross displaced fracture identified. A nondisplaced fracture may be obscured by the motion artifact. Soft tissues and spinal canal: No prevertebral fluid or swelling. No visible canal hematoma. Disc levels:  Negative. Upper chest: Please refer to the concurrent CT of chest. Other: Negative. IMPRESSION: 1. No acute intracranial abnormality or displaced calvarial fracture. Unremarkable CT of the head. 2. Extensive motion artifact throughout the cervical spine. No gross displaced fracture identified. A nondisplaced fracture may be obscured by motion artifact. No secondary signs of cervical injury identified. Electronically Signed   By: [name] M.D.   On: 12/25/2018 02:45   Ct Abdomen Pelvis W Contrast  Result Date: 12/25/2018 CLINICAL DATA:  Fall off one-step stair falling backwards landing on right side. Right rib pain and difficulty breathing. EXAM: CT CHEST, ABDOMEN, AND PELVIS WITH CONTRAST TECHNIQUE: Multidetector CT imaging of the chest, abdomen and pelvis was performed following the standard protocol during bolus administration of intravenous contrast. CONTRAST:  161mL OMNIPAQUE IOHEXOL 300 MG/ML  SOLN COMPARISON:  Radiograph earlier this day. Abdominal CT 05/30/2018. Chest CT 10/16/2014 FINDINGS: CT CHEST FINDINGS Cardiovascular: No vascular injury. Normal heart size. No pericardial effusion.  There are coronary artery calcifications. Mediastinum/Nodes: No mediastinal hemorrhage or hematoma. No pneumomediastinum. No adenopathy. Esophagus is mildly patulous, no wall thickening. No visualized thyroid nodule. Lungs/Pleura: No pneumothorax. No pulmonary contusion. Mild emphysema. Mild right pleural thickening without frank effusion. Atelectasis in the dependent right lower lobe. Blunting of the left costophrenic angle due to prominent epicardial fat pad. Linear lingular and left lower lobe opacities. No pulmonary edema. No pulmonary mass. Musculoskeletal: Minimally displaced fractures of right lateral tenth and eleventh ribs. Tiny focus of subcutaneous gas adjacent to the tenth rib fracture. No left rib fracture. No fracture of the sternum, included clavicles or shoulder girdles. Mild anterior wedging of multiple thoracic vertebral bodies, chronic and unchanged from prior. Patchy subcutaneous edema in the right posterior flank. CT ABDOMEN PELVIS FINDINGS Hepatobiliary: No hepatic injury or perihepatic hematoma. Scattered cysts throughout the liver are again seen and unchanged from prior. Gallbladder is unremarkable. Pancreas: No evidence of injury. No ductal dilatation or inflammation. Spleen: No splenic injury or perisplenic hematoma. Adrenals/Urinary Tract: No adrenal hemorrhage or renal injury identified. Simple cyst in the upper left kidney again seen. Dome of the urinary bladder extends into the right inguinal canal with associated bladder wall thickening, the degree of wall thickening has improved from prior exam. Stomach/Bowel: No evidence of bowel injury. No mesenteric  hematoma. Left inguinal hernia contains sigmoid colon. Mild colonic diverticulosis without diverticulitis. Submucosal fatty infiltration throughout the colon consistent with prior/chronic inflammation. Patulous loop of small bowel in the left upper quadrant as before. No acute bowel inflammation. Normal appendix. Vascular/Lymphatic: No  vascular injury. The abdominal aorta and IVC are intact. No retroperitoneal fluid. Portal vein is patent. Mild aortic atherosclerosis. No adenopathy. Reproductive: Prominent prostate gland as before. Other: Patchy subcutaneous edema in the right posterior flank at the site of rib fractures. No other body wall contusion. No free air or free fluid in the abdomen or pelvis. Fat containing umbilical hernia. Right inguinal hernia containing bladder dome. Left inguinal hernia containing sigmoid colon. Musculoskeletal: No acute fracture of the lumbar spine or pelvis. Mild chronic loss of height of L1 vertebral body. IMPRESSION: 1. Minimally displaced fractures of right lateral tenth and eleventh ribs. No pneumothorax or pulmonary complication. 2. Subcutaneous edema in the right posterior flank at the site of rib fractures. No additional acute traumatic injury to the chest, abdomen, or pelvis. 3. Bilateral inguinal hernias. The left inguinal hernia now contains nonobstructed sigmoid colon. Right inguinal hernia contains bladder dome, unchanged from prior exam. Decreased bladder wall thickening from most recent prior. 4. Aortic Atherosclerosis (ICD10-I70.0) and Emphysema (ICD10-J43.9). Electronically Signed   By: Keith Rake M.D.   On: 12/25/2018 02:58   Dg Chest Port 1 View  Result Date: 12/25/2018 CLINICAL DATA:  Pain. Patient reports fall off stairs. Right rib pain and difficulty breathing. EXAM: PORTABLE CHEST 1 VIEW COMPARISON:  Chest radiograph 08/22/2017 FINDINGS: Low lung volumes. Blunting of left costophrenic angle with adjacent streaky opacity. No pneumothorax. Normal heart size and mediastinal contours. No evidence of rib fracture or acute osseous abnormality. IMPRESSION: 1. Low lung volumes. Minimal streaky left lung base opacity and blunting of costophrenic angle, possible small effusion. 2. No visualized rib fracture. Electronically Signed   By: Keith Rake M.D.   On: 12/25/2018 01:30     Procedures .Critical Care Performed by: Ripley Fraise, MD Authorized by: Ripley Fraise, MD   Critical care provider statement:    Critical care time (minutes):  40   Critical care start time:  12/25/2018 4:20 AM   Critical care end time:  12/25/2018 5:00 AM   Critical care time was exclusive of:  Separately billable procedures and treating other patients   Critical care was necessary to treat or prevent imminent or life-threatening deterioration of the following conditions:  Respiratory failure and trauma   Critical care was time spent personally by me on the following activities:  Examination of patient, evaluation of patient's response to treatment, discussions with consultants, development of treatment plan with patient or surrogate, re-evaluation of patient's condition, pulse oximetry, ordering and review of radiographic studies and ordering and review of laboratory studies   I assumed direction of critical care for this patient from another provider in my specialty: no       Medications Ordered in ED Medications  HYDROmorphone (DILAUDID) injection 1 mg (1 mg Intravenous Given 12/25/18 0113)  ondansetron (ZOFRAN) injection 4 mg (4 mg Intravenous Given 12/25/18 0113)  fentaNYL (SUBLIMAZE) injection 100 mcg (100 mcg Intravenous Given 12/25/18 0202)  iohexol (OMNIPAQUE) 300 MG/ML solution 100 mL (100 mLs Intravenous Contrast Given 12/25/18 0223)  ketorolac (TORADOL) 30 MG/ML injection 30 mg (30 mg Intravenous Given 12/25/18 0334)  fentaNYL (SUBLIMAZE) injection 100 mcg (100 mcg Intravenous Given 12/25/18 0334)  albuterol (VENTOLIN HFA) 108 (90 Base) MCG/ACT inhaler 2 puff (2 puffs Inhalation Given  12/25/18 0336)  aerochamber plus with mask device 1 each (1 each Other Given 12/25/18 0340)  AeroChamber Plus Flo-Vu Large MISC (  Given 12/25/18 0344)  fentaNYL (SUBLIMAZE) injection 100 mcg (100 mcg Intravenous Given 12/25/18 0503)     Initial Impression / Assessment and Plan / ED Course  I  have reviewed the triage vital signs and the nursing notes.  Pertinent labs & imaging results that were available during my care of the patient were reviewed by me and considered in my medical decision making (see chart for details).        2:03 AM Patient presents after accidental fall landing on his posterior chest.  Initial x-rays negative for PTX.  He is still in significant pain and also has pain in his flank, will obtain CT chest and abdomen/ pelvis, due to alcohol abuse, will be unable to rule out head or neck injury, but patient denies LOC but is unreliable 3:27 AM Patient found to have rib fractures but no PTX.  He is still having significant pain.  Will give another round of meds and albuterol He is wheezing and tachypneic He also has known inguinal hernias that are seen on CT, no acute change 6:01 AM Pt found to have dyspnea on exertion and hypoxia with ambulation Pt is tachypneic after ambulation He has significant pain from his rib fractures Pt will need to be admitted D/w dr Brantley Stage with trauma surgery for admisison  Final Clinical Impressions(s) / ED Diagnoses   Final diagnoses:  Closed fracture of multiple ribs of right side, initial encounter  Hypoxia    ED Discharge Orders    None       Ripley Fraise, MD 12/25/18 [phone]

## 2018-12-25 NOTE — ED Notes (Signed)
Pt ambulated on the hallway with steady gait, but pt was very SOB and SPO2 dropped to low 80% on RA.

## 2018-12-25 NOTE — ED Triage Notes (Signed)
Came  In via ems; reported fell off off 1 step stairs; falling backward and impact on right side; c/o severe right rib pain and difficulty breathing.

## 2018-12-26 ENCOUNTER — Inpatient Hospital Stay (HOSPITAL_COMMUNITY): Payer: No Typology Code available for payment source

## 2018-12-26 LAB — BASIC METABOLIC PANEL
Anion gap: 10 (ref 5–15)
BUN: 8 mg/dL (ref 8–23)
CO2: 22 mmol/L (ref 22–32)
Calcium: 8.8 mg/dL — ABNORMAL LOW (ref 8.9–10.3)
Chloride: 104 mmol/L (ref 98–111)
Creatinine, Ser: 0.91 mg/dL (ref 0.61–1.24)
GFR calc Af Amer: >60 mL/min (ref >60)
GFR calc non Af Amer: >60 mL/min (ref >60)
Glucose, Bld: 139 mg/dL — ABNORMAL HIGH (ref 70–99)
Potassium: 3.9 mmol/L (ref 3.5–5.1)
Sodium: 136 mmol/L (ref 135–145)

## 2018-12-26 LAB — NOVEL CORONAVIRUS, NAA (HOSP ORDER, SEND-OUT TO REF LAB; TAT 18-24 HRS): SARS-CoV-2, NAA: NOT DETECTED

## 2018-12-26 LAB — CBC
HCT: 40.2 % (ref 39.0–52.0)
Hemoglobin: 12.9 g/dL — ABNORMAL LOW (ref 13.0–17.0)
MCH: 29.9 pg (ref 26.0–34.0)
MCHC: 32.1 g/dL (ref 30.0–36.0)
MCV: 93.3 fL (ref 80.0–100.0)
Platelets: 147 10*3/uL — ABNORMAL LOW (ref 150–400)
RBC: 4.31 MIL/uL (ref 4.22–5.81)
RDW: 16.4 % — ABNORMAL HIGH (ref 11.5–15.5)
WBC: 8.9 10*3/uL (ref 4.0–10.5)
nRBC: 0 % (ref 0.0–0.2)

## 2018-12-26 LAB — HIV ANTIBODY (ROUTINE TESTING W REFLEX): HIV Screen 4th Generation wRfx: NONREACTIVE

## 2018-12-26 MED ORDER — ALBUTEROL SULFATE (2.5 MG/3ML) 0.083% IN NEBU
2.5000 mg | INHALATION_SOLUTION | Freq: Four times a day (QID) | RESPIRATORY_TRACT | Status: DC | PRN
  Administered 2018-12-26: 2.5 mg via RESPIRATORY_TRACT
  Filled 2018-12-26: qty 3

## 2018-12-26 MED ORDER — IPRATROPIUM-ALBUTEROL 0.5-2.5 (3) MG/3ML IN SOLN
3.0000 mL | Freq: Four times a day (QID) | RESPIRATORY_TRACT | Status: DC
  Administered 2018-12-26 – 2018-12-28 (×9): 3 mL via RESPIRATORY_TRACT
  Filled 2018-12-26: qty 9
  Filled 2018-12-26 (×7): qty 3

## 2018-12-26 MED ORDER — ALBUTEROL SULFATE (2.5 MG/3ML) 0.083% IN NEBU: 2.5000 mg | INHALATION_SOLUTION | RESPIRATORY_TRACT | Status: DC

## 2018-12-26 MED ORDER — PHENOL 1.4 % MT LIQD: 1.0000 | OROMUCOSAL | Status: DC | PRN

## 2018-12-26 NOTE — Progress Notes (Signed)
Present at patient bedside when  Patient speaking to significant other for update on patient condition.

## 2018-12-26 NOTE — Progress Notes (Signed)
RT called RN and notified her that the patient's wheezes are upper airway.  Patient states he wheezes like this all the time and is in no distress.

## 2018-12-26 NOTE — TOC Initial Note (Signed)
Transition of Care [facility]) - Initial/Assessment Note    Patient Details  Name: [name] MRN: [id number] Date of Birth: December 28, 1955  Transition of Care [facility]) CM/SW Contact:    Alexander Mt, [place] Phone Number: 12/26/2018, 10:16 AM  Clinical Narrative:                 CSW spoke with pt on room phone. Introduced self, role, reason for call. Pt amenable to speaking with Education officer, museum. Pt from home with significant other, he has assistance at discharge as needed. Pt has a cane at home, amenable to information about scheduling a PCP appointment (this was placed on AVS).   CSW completed SBIRT; pt admits to drinking prior to coming to hospital. Pt does not think he has a problem with drinking- when asked about frequency pt states  "It just depends". Pt denies consuming 6 or more drinks daily and also declines any cessation resources.   Expected Discharge Plan: Home/Self Care Barriers to Discharge: Continued Medical Work up   Patient Goals and CMS Choice Patient states their goals for this hospitalization and ongoing recovery are:: to return home   Choice offered to / list presented to : Patient  Expected Discharge Plan and Services Expected Discharge Plan: Home/Self Care In-house Referral: Clinical Social Work Discharge Planning Services: CM Consult Post Acute Care Choice: Durable Medical Equipment(if pt needs rolling walker; only has cane) Living arrangements for the past 2 months: Single Family Home                                      Prior Living Arrangements/Services Living arrangements for the past 2 months: Single Family Home Lives with:: Significant Other Patient language and need for interpreter reviewed:: Yes(no needs) Do you feel safe going back to the place where you live?: Yes      Need for Family Participation in Patient Care: Yes (Comment)(assistance as needed) Care giver support system in place?: Yes (comment)(significant other) Current home  services: DME    Activities of Daily Living      Permission Sought/Granted Permission sought to share information with : Family Supports Permission granted to share information with : Yes, Verbal Permission Granted  Share Information with NAME: Andreas Newport     Permission granted to share info w Relationship: significant other  Permission granted to share info w Contact Information: [phone]  Emotional Assessment Appearance:: Other (Comment Required(spoke with pt on phone) Attitude/Demeanor/Rapport: (spoke with pt on phone) Affect (typically observed): (spoke with pt on phone) Orientation: : Oriented to Self, Oriented to Place, Oriented to  Time, Oriented to Situation Alcohol / Substance Use: Alcohol Use Psych Involvement: No (comment)  Admission diagnosis:  Rib fractures [S22.39XA] Hypoxia [R09.02] Closed fracture of multiple ribs of right side, initial encounter [S22.41XA] Patient Active Problem List   Diagnosis Date Noted  . Rib fractures 12/25/2018  . Acute CVA (cerebrovascular accident) ([place]) 04/08/2018  . TIA (transient ischemic attack) 08/22/2017  . Bilateral recurrent inguinal hernia without obstruction or gangrene   . Influenza 10/18/2014  . Hypoxia 10/16/2014  . SOB (shortness of breath) 10/16/2014  . Cough 10/16/2014  . Fever 10/16/2014  . Sepsis ([place]) 10/16/2014  . Nausea and vomiting 10/16/2014  . Acid reflux   . DDD (degenerative disc disease), lumbar   . Hypertension   . Tobacco abuse   . Alcohol abuse   . Essential hypertension  PCP:  Patient, No Pcp Per Pharmacy:   [id number] [place], [address] [place] [address] Phone: [phone] Fax: [phone]     Social Determinants of Health (SDOH) Interventions    Readmission Risk Interventions No flowsheet data found.

## 2018-12-26 NOTE — Progress Notes (Signed)
Assessment & Plan: Fall R rib FX 10-11 ETOH - CIWA BIH - chronic HTN - home Norvasc  Admit for multimodal pain control, pulmonary toilet, bronchodilators, PT/OT  CXR this AM 6/20 with some atelectasis.  Improving with somewhat less pain this AM.  Continue to mobilize.  Possibly home tomorrow 6/21.        Troy Gemma, MD       [facility]       Office: [phone]   Chief Complaint: Fall, rib fractures  Subjective: Patient up in chair, comfortable.  Did not eat breakfast today.  Not hungry.  Objective: Vital signs in last 24 hours: Temp:  [97.9 F (36.6 C)-98.4 F (36.9 C)] 98.3 F (36.8 C) (06/20 0510) Pulse Rate:  [69-110] 109 (06/20 0759) Resp:  [15-20] 20 (06/20 0759) BP: (140-170)/(87-106) 150/97 (06/20 0510) SpO2:  [92 %-98 %] 95 % (06/20 0759) Last BM Date: 12/24/18  Intake/Output from previous day: 06/19 0701 - 06/20 0700 In: 240 [P.O.:240] Out: 350 [Urine:350] Intake/Output this shift: No intake/output data recorded.  Physical Exam: HEENT - sclerae clear, mucous membranes moist Neck - soft Chest - clear bilaterally, no wheezing, slightly diminished on right due to splinting Cor - RRR Ext - no edema, non-tender Neuro - alert & oriented, no focal deficits  Lab Results:  Recent Labs    12/25/18 0126 12/26/18 0344  WBC 6.4 8.9  HGB 12.9* 12.9*  HCT 39.6 40.2  PLT 188 147*   BMET Recent Labs    12/25/18 0126 12/26/18 0344  NA 137 136  K 3.3* 3.9  CL 104 104  CO2 22 22  GLUCOSE 138* 139*  BUN 9 8  CREATININE 0.92 0.91  CALCIUM 8.7* 8.8*   PT/INR Recent Labs    12/25/18 0126  LABPROT 12.6  INR 1.0   Comprehensive Metabolic Panel:    Component Value Date/Time   NA 136 12/26/2018 0344   NA 137 12/25/2018 0126   NA 136 04/16/2014 0729   NA 139 02/24/2014 2337   K 3.9 12/26/2018 0344   K 3.3 (L) 12/25/2018 0126   K 3.8 04/16/2014 0729   K 4.3 02/24/2014 2337   CL 104 12/26/2018 0344   CL 104  12/25/2018 0126   CL 103 04/16/2014 0729   CL 102 02/24/2014 2337   CO2 22 12/26/2018 0344   CO2 22 12/25/2018 0126   CO2 21 04/16/2014 0729   CO2 24 02/24/2014 2337   BUN 8 12/26/2018 0344   BUN 9 12/25/2018 0126   BUN 10 04/16/2014 0729   BUN 7 02/24/2014 2337   CREATININE 0.91 12/26/2018 0344   CREATININE 0.92 12/25/2018 0126   CREATININE 0.87 04/16/2014 0729   CREATININE 0.90 02/24/2014 2337   GLUCOSE 139 (H) 12/26/2018 0344   GLUCOSE 138 (H) 12/25/2018 0126   GLUCOSE 87 04/16/2014 0729   GLUCOSE 88 02/24/2014 2337   CALCIUM 8.8 (L) 12/26/2018 0344   CALCIUM 8.7 (L) 12/25/2018 0126   CALCIUM 9.0 04/16/2014 0729   CALCIUM 8.8 02/24/2014 2337   AST 29 05/30/2018 1637   AST 21 04/08/2018 0813   AST 43 (H) 04/16/2014 0729   AST 101 (H) 02/24/2014 2337   ALT 24 05/30/2018 1637   ALT 13 04/08/2018 0813   ALT 53 04/16/2014 0729   ALT 131 (H) 02/24/2014 2337   ALKPHOS 42 05/30/2018 1637   ALKPHOS 48 04/08/2018 0813   ALKPHOS 81 04/16/2014 0729   ALKPHOS 68 02/24/2014 2337  BILITOT 0.5 05/30/2018 1637   BILITOT 1.0 04/08/2018 0813   BILITOT 0.8 04/16/2014 0729   BILITOT 0.5 02/24/2014 2337   PROT 6.8 05/30/2018 1637   PROT 7.4 04/08/2018 0813   PROT 7.6 04/16/2014 0729   PROT 7.7 02/24/2014 2337   ALBUMIN 4.2 05/30/2018 1637   ALBUMIN 4.4 04/08/2018 0813   ALBUMIN 4.2 04/16/2014 0729   ALBUMIN 3.9 02/24/2014 2337    Studies/Results: Ct Head Wo Contrast  Result Date: 12/25/2018 CLINICAL DATA:  63 y/o M; fall backwards impact on the right side. Severe right rib pain. Difficulty breathing. Head trauma. EXAM: CT HEAD WITHOUT CONTRAST CT CERVICAL SPINE WITHOUT CONTRAST TECHNIQUE: Multidetector CT imaging of the head and cervical spine was performed following the standard protocol without intravenous contrast. Multiplanar CT image reconstructions of the cervical spine were also generated. COMPARISON:  04/08/2018 MRI head and CT head. 06/18/2013 CT head and cervical spine.  FINDINGS: CT HEAD FINDINGS Brain: No evidence of acute infarction, hemorrhage, hydrocephalus, extra-axial collection or mass lesion/mass effect. Vascular: Calcific atherosclerosis of the internal carotid arteries. Skull: Normal. Negative for fracture or focal lesion. Sinuses/Orbits: No acute finding. Other: None. CT CERVICAL SPINE FINDINGS Alignment: Straightening of cervical lordosis without listhesis. Skull base and vertebrae: Extensive motion artifact throughout the cervical spine. No gross displaced fracture identified. A nondisplaced fracture may be obscured by the motion artifact. Soft tissues and spinal canal: No prevertebral fluid or swelling. No visible canal hematoma. Disc levels:  Negative. Upper chest: Please refer to the concurrent CT of chest. Other: Negative. IMPRESSION: 1. No acute intracranial abnormality or displaced calvarial fracture. Unremarkable CT of the head. 2. Extensive motion artifact throughout the cervical spine. No gross displaced fracture identified. A nondisplaced fracture may be obscured by motion artifact. No secondary signs of cervical injury identified. Electronically Signed   By: [name] M.D.   On: 12/25/2018 02:45   Ct Chest W Contrast  Result Date: 12/25/2018 CLINICAL DATA:  Fall off one-step stair falling backwards landing on right side. Right rib pain and difficulty breathing. EXAM: CT CHEST, ABDOMEN, AND PELVIS WITH CONTRAST TECHNIQUE: Multidetector CT imaging of the chest, abdomen and pelvis was performed following the standard protocol during bolus administration of intravenous contrast. CONTRAST:  1107mL OMNIPAQUE IOHEXOL 300 MG/ML  SOLN COMPARISON:  Radiograph earlier this day. Abdominal CT 05/30/2018. Chest CT 10/16/2014 FINDINGS: CT CHEST FINDINGS Cardiovascular: No vascular injury. Normal heart size. No pericardial effusion. There are coronary artery calcifications. Mediastinum/Nodes: No mediastinal hemorrhage or hematoma. No pneumomediastinum. No  adenopathy. Esophagus is mildly patulous, no wall thickening. No visualized thyroid nodule. Lungs/Pleura: No pneumothorax. No pulmonary contusion. Mild emphysema. Mild right pleural thickening without frank effusion. Atelectasis in the dependent right lower lobe. Blunting of the left costophrenic angle due to prominent epicardial fat pad. Linear lingular and left lower lobe opacities. No pulmonary edema. No pulmonary mass. Musculoskeletal: Minimally displaced fractures of right lateral tenth and eleventh ribs. Tiny focus of subcutaneous gas adjacent to the tenth rib fracture. No left rib fracture. No fracture of the sternum, included clavicles or shoulder girdles. Mild anterior wedging of multiple thoracic vertebral bodies, chronic and unchanged from prior. Patchy subcutaneous edema in the right posterior flank. CT ABDOMEN PELVIS FINDINGS Hepatobiliary: No hepatic injury or perihepatic hematoma. Scattered cysts throughout the liver are again seen and unchanged from prior. Gallbladder is unremarkable. Pancreas: No evidence of injury. No ductal dilatation or inflammation. Spleen: No splenic injury or perisplenic hematoma. Adrenals/Urinary Tract: No adrenal hemorrhage or renal injury identified.  Simple cyst in the upper left kidney again seen. Dome of the urinary bladder extends into the right inguinal canal with associated bladder wall thickening, the degree of wall thickening has improved from prior exam. Stomach/Bowel: No evidence of bowel injury. No mesenteric hematoma. Left inguinal hernia contains sigmoid colon. Mild colonic diverticulosis without diverticulitis. Submucosal fatty infiltration throughout the colon consistent with prior/chronic inflammation. Patulous loop of small bowel in the left upper quadrant as before. No acute bowel inflammation. Normal appendix. Vascular/Lymphatic: No vascular injury. The abdominal aorta and IVC are intact. No retroperitoneal fluid. Portal vein is patent. Mild aortic  atherosclerosis. No adenopathy. Reproductive: Prominent prostate gland as before. Other: Patchy subcutaneous edema in the right posterior flank at the site of rib fractures. No other body wall contusion. No free air or free fluid in the abdomen or pelvis. Fat containing umbilical hernia. Right inguinal hernia containing bladder dome. Left inguinal hernia containing sigmoid colon. Musculoskeletal: No acute fracture of the lumbar spine or pelvis. Mild chronic loss of height of L1 vertebral body. IMPRESSION: 1. Minimally displaced fractures of right lateral tenth and eleventh ribs. No pneumothorax or pulmonary complication. 2. Subcutaneous edema in the right posterior flank at the site of rib fractures. No additional acute traumatic injury to the chest, abdomen, or pelvis. 3. Bilateral inguinal hernias. The left inguinal hernia now contains nonobstructed sigmoid colon. Right inguinal hernia contains bladder dome, unchanged from prior exam. Decreased bladder wall thickening from most recent prior. 4. Aortic Atherosclerosis (ICD10-I70.0) and Emphysema (ICD10-J43.9). Electronically Signed   By: Keith Rake M.D.   On: 12/25/2018 02:58   Ct Cervical Spine Wo Contrast  Result Date: 12/25/2018 CLINICAL DATA:  63 y/o M; fall backwards impact on the right side. Severe right rib pain. Difficulty breathing. Head trauma. EXAM: CT HEAD WITHOUT CONTRAST CT CERVICAL SPINE WITHOUT CONTRAST TECHNIQUE: Multidetector CT imaging of the head and cervical spine was performed following the standard protocol without intravenous contrast. Multiplanar CT image reconstructions of the cervical spine were also generated. COMPARISON:  04/08/2018 MRI head and CT head. 06/18/2013 CT head and cervical spine. FINDINGS: CT HEAD FINDINGS Brain: No evidence of acute infarction, hemorrhage, hydrocephalus, extra-axial collection or mass lesion/mass effect. Vascular: Calcific atherosclerosis of the internal carotid arteries. Skull: Normal. Negative  for fracture or focal lesion. Sinuses/Orbits: No acute finding. Other: None. CT CERVICAL SPINE FINDINGS Alignment: Straightening of cervical lordosis without listhesis. Skull base and vertebrae: Extensive motion artifact throughout the cervical spine. No gross displaced fracture identified. A nondisplaced fracture may be obscured by the motion artifact. Soft tissues and spinal canal: No prevertebral fluid or swelling. No visible canal hematoma. Disc levels:  Negative. Upper chest: Please refer to the concurrent CT of chest. Other: Negative. IMPRESSION: 1. No acute intracranial abnormality or displaced calvarial fracture. Unremarkable CT of the head. 2. Extensive motion artifact throughout the cervical spine. No gross displaced fracture identified. A nondisplaced fracture may be obscured by motion artifact. No secondary signs of cervical injury identified. Electronically Signed   By: [name] M.D.   On: 12/25/2018 02:45   Ct Abdomen Pelvis W Contrast  Result Date: 12/25/2018 CLINICAL DATA:  Fall off one-step stair falling backwards landing on right side. Right rib pain and difficulty breathing. EXAM: CT CHEST, ABDOMEN, AND PELVIS WITH CONTRAST TECHNIQUE: Multidetector CT imaging of the chest, abdomen and pelvis was performed following the standard protocol during bolus administration of intravenous contrast. CONTRAST:  170mL OMNIPAQUE IOHEXOL 300 MG/ML  SOLN COMPARISON:  Radiograph earlier this day.  Abdominal CT 05/30/2018. Chest CT 10/16/2014 FINDINGS: CT CHEST FINDINGS Cardiovascular: No vascular injury. Normal heart size. No pericardial effusion. There are coronary artery calcifications. Mediastinum/Nodes: No mediastinal hemorrhage or hematoma. No pneumomediastinum. No adenopathy. Esophagus is mildly patulous, no wall thickening. No visualized thyroid nodule. Lungs/Pleura: No pneumothorax. No pulmonary contusion. Mild emphysema. Mild right pleural thickening without frank effusion. Atelectasis  in the dependent right lower lobe. Blunting of the left costophrenic angle due to prominent epicardial fat pad. Linear lingular and left lower lobe opacities. No pulmonary edema. No pulmonary mass. Musculoskeletal: Minimally displaced fractures of right lateral tenth and eleventh ribs. Tiny focus of subcutaneous gas adjacent to the tenth rib fracture. No left rib fracture. No fracture of the sternum, included clavicles or shoulder girdles. Mild anterior wedging of multiple thoracic vertebral bodies, chronic and unchanged from prior. Patchy subcutaneous edema in the right posterior flank. CT ABDOMEN PELVIS FINDINGS Hepatobiliary: No hepatic injury or perihepatic hematoma. Scattered cysts throughout the liver are again seen and unchanged from prior. Gallbladder is unremarkable. Pancreas: No evidence of injury. No ductal dilatation or inflammation. Spleen: No splenic injury or perisplenic hematoma. Adrenals/Urinary Tract: No adrenal hemorrhage or renal injury identified. Simple cyst in the upper left kidney again seen. Dome of the urinary bladder extends into the right inguinal canal with associated bladder wall thickening, the degree of wall thickening has improved from prior exam. Stomach/Bowel: No evidence of bowel injury. No mesenteric hematoma. Left inguinal hernia contains sigmoid colon. Mild colonic diverticulosis without diverticulitis. Submucosal fatty infiltration throughout the colon consistent with prior/chronic inflammation. Patulous loop of small bowel in the left upper quadrant as before. No acute bowel inflammation. Normal appendix. Vascular/Lymphatic: No vascular injury. The abdominal aorta and IVC are intact. No retroperitoneal fluid. Portal vein is patent. Mild aortic atherosclerosis. No adenopathy. Reproductive: Prominent prostate gland as before. Other: Patchy subcutaneous edema in the right posterior flank at the site of rib fractures. No other body wall contusion. No free air or free fluid in the  abdomen or pelvis. Fat containing umbilical hernia. Right inguinal hernia containing bladder dome. Left inguinal hernia containing sigmoid colon. Musculoskeletal: No acute fracture of the lumbar spine or pelvis. Mild chronic loss of height of L1 vertebral body. IMPRESSION: 1. Minimally displaced fractures of right lateral tenth and eleventh ribs. No pneumothorax or pulmonary complication. 2. Subcutaneous edema in the right posterior flank at the site of rib fractures. No additional acute traumatic injury to the chest, abdomen, or pelvis. 3. Bilateral inguinal hernias. The left inguinal hernia now contains nonobstructed sigmoid colon. Right inguinal hernia contains bladder dome, unchanged from prior exam. Decreased bladder wall thickening from most recent prior. 4. Aortic Atherosclerosis (ICD10-I70.0) and Emphysema (ICD10-J43.9). Electronically Signed   By: Keith Rake M.D.   On: 12/25/2018 02:58   Dg Chest Port 1 View  Result Date: 12/26/2018 CLINICAL DATA:  63 y/o  M; shortness of breath. EXAM: PORTABLE CHEST 1 VIEW COMPARISON:  12/25/2018 chest radiograph and CT chest. FINDINGS: Stable normal cardiac silhouette given projection and technique. Right tenth and eleventh rib fractures better characterized on prior CT. No new acute osseous abnormality. No pneumothorax or pleural effusion. Increased linear left basilar opacities. No new consolidation. IMPRESSION: Right tenth and eleventh rib fractures on prior CT are poorly visualized. Increased left basilar platelike atelectasis. Electronically Signed   By: [name] M.D.   On: 12/26/2018 06:13   Dg Chest Port 1 View  Result Date: 12/25/2018 CLINICAL DATA:  Pain. Patient reports fall off stairs.  Right rib pain and difficulty breathing. EXAM: PORTABLE CHEST 1 VIEW COMPARISON:  Chest radiograph 08/22/2017 FINDINGS: Low lung volumes. Blunting of left costophrenic angle with adjacent streaky opacity. No pneumothorax. Normal heart size and  mediastinal contours. No evidence of rib fracture or acute osseous abnormality. IMPRESSION: 1. Low lung volumes. Minimal streaky left lung base opacity and blunting of costophrenic angle, possible small effusion. 2. No visualized rib fracture. Electronically Signed   By: Keith Rake M.D.   On: 12/25/2018 01:30      [name] 12/26/2018  Patient ID: [name], male   DOB: 09-19-1955, 63 y.o.   MRN: [id number]

## 2018-12-26 NOTE — Progress Notes (Signed)
Pt having increased shortness of breath after using the bathroom. satting 89% on [place] 2L, increased to 3L, O2 sat in creased to 91-92%. HR=110-115 other VSS. PRN pain meds given as ordered. Made Dr. Dema Severin aware, stat portable CXR and prn breathing trt ordered. Will continue to monitor.

## 2018-12-26 NOTE — Progress Notes (Signed)
Physical Therapy Treatment Patient Details Name: [name] MRN: [id number] DOB: [birth year] Today's Date: 12/26/2018    History of Present Illness Pt is a 63 y/o male admitted after fall down steps. Found to have R 10-11 rib fractures. PMH includes alcohol abuse and HTN.     PT Comments    Pt was seen for mobility and strength after being assisted OOB by nursing today, with labored breathing noted, lower sats and has been having elevations of pulses.  He is progressing from shorter walking to completing several walks today, verbalizes his motivations for home and is expecting to return to work as soon as possible.  Had a fall with pt reporting sliding on one foot and so will continue to emphasize safety and balance with all movement including encouraging him to avoid backing up with walker any more than must be done to maneuver.  Monitor O2 sats and pulses with all gait to avoid overstressing his system.   Follow Up Recommendations  Home health PT     Equipment Recommendations  Rolling walker with 5" wheels    Recommendations for Other Services       Precautions / Restrictions Precautions Precautions: Fall;Other (comment) Precaution Comments: monitor pulses and sats Restrictions Weight Bearing Restrictions: No    Mobility  Bed Mobility               General bed mobility comments: up in chair when PT arrived  Transfers Overall transfer level: Needs assistance Equipment used: Rolling walker (2 wheeled);1 person hand held assist Transfers: Sit to/from Stand Sit to Stand: Mod assist         General transfer comment: mod to power up with labored congested breathing  Ambulation/Gait Ambulation/Gait assistance: Min guard;Min assist Gait Distance (Feet): 150 [phone]) Assistive device: Rolling walker (2 wheeled);1 person hand held assist Gait Pattern/deviations: Step-through pattern;Decreased stride length;Wide base of support;Drifts right/left Gait  velocity: Decreased Gait velocity interpretation: <1.8 ft/sec, indicate of risk for recurrent falls General Gait Details: O2 at 3L today, cues for breathing slowly and with deeper breath   Stairs             Wheelchair Mobility    Modified Rankin (Stroke Patients Only)       Balance Overall balance assessment: Needs assistance Sitting-balance support: Feet supported Sitting balance-Leahy Scale: Fair     Standing balance support: Bilateral upper extremity supported;During functional activity Standing balance-Leahy Scale: Fair Standing balance comment: leaning on LUE more than R due to rib pain                            Cognition Arousal/Alertness: Awake/alert Behavior During Therapy: WFL for tasks assessed/performed Overall Cognitive Status: Within Functional Limits for tasks assessed                                 General Comments: pt is aware of his need to focus on deep breathing but is breath holding at times.      Exercises      General Comments General comments (skin integrity, edema, etc.): O2 sats were progressively better with movement from 93% at rest to 95% wiht gait but pulse from 109 to 132 with movement      Pertinent Vitals/Pain Pain Assessment: 0-10 Pain Score: 7  Pain Location: R ribs Pain Descriptors / Indicators: Grimacing Pain Intervention(s): Limited activity within patient's tolerance;Monitored during  session;Premedicated before session;Repositioned    Home Living                      Prior Function            PT Goals (current goals can now be found in the care plan section) Acute Rehab PT Goals Patient Stated Goal: get home  Progress towards PT goals: Progressing toward goals    Frequency    Min 3X/week      PT Plan Discharge plan needs to be updated    Co-evaluation              AM-PAC PT "6 Clicks" Mobility   Outcome Measure  Help needed turning from your back to your  side while in a flat bed without using bedrails?: A Little Help needed moving from lying on your back to sitting on the side of a flat bed without using bedrails?: A Little Help needed moving to and from a bed to a chair (including a wheelchair)?: A Little Help needed standing up from a chair using your arms (e.g., wheelchair or bedside chair)?: A Lot Help needed to walk in hospital room?: A Little Help needed climbing 3-5 steps with a railing? : A Lot 6 Click Score: 16    End of Session Equipment Utilized During Treatment: Oxygen Activity Tolerance: Patient tolerated treatment well;Patient limited by fatigue Patient left: in chair;with call bell/phone within reach;with chair alarm set Nurse Communication: Mobility status PT Visit Diagnosis: Difficulty in walking, not elsewhere classified (R26.2);Pain Pain - Right/Left: Right Pain - part of body: (ribs)     Time: 9753-0051 PT Time Calculation (min) (ACUTE ONLY): 31 min  Charges:  $Gait Training: 8-22 mins $Therapeutic Activity: 8-22 mins                    Ramond Dial 12/26/2018, 1:59 PM  Mee Hives, PT MS Acute Rehab Dept. Number: [place] [facility] [place]

## 2018-12-26 NOTE — Evaluation (Signed)
Occupational Therapy Evaluation Patient Details Name: [name] MRN: [id number] DOB: Nov 23, 1955 Today's Date: 12/26/2018    History of Present Illness Pt is a 63 y/o male admitted after fall down steps. Found to have R 10-11 rib fractures. PMH includes alcohol abuse and HTN.    Clinical Impression   PTA patient independent and working. Admitted for above and limited by problem list below, including pain, impaired balance and decreased activity tolerance.  Patient requires min assist for bed mobility and basic transfers, min-mod assist for UB ADLs, and mod-max assist for LB ADLs.  Pt on 3L oxygen via [place] throughout session with saturations maintained (90-92%) but dropping after transfer to 86%, recovering with PLB.  Noted wheezing throughout session. Reviewed IS used.  Patient will benefit from continued OT services while admitted to maximize safety and independence with ADLs/mobility, may progress to no OT needed but currently recommend [place] services.     Follow Up Recommendations  Home health OT;Supervision/Assistance - 24 hour(pending progression)    Equipment Recommendations  3 in 1 bedside commode    Recommendations for Other Services PT consult     Precautions / Restrictions Precautions Precautions: Fall;Other (comment) Precaution Comments: watch O2 Restrictions Weight Bearing Restrictions: No      Mobility Bed Mobility Overal bed mobility: Needs Assistance Bed Mobility: Supine to Sit     Supine to sit: Min assist;HOB elevated     General bed mobility comments: min assist for trunk support to ascend to EOB with HOB elevated; increased time and effort, limited by pain  Transfers Overall transfer level: Needs assistance Equipment used: 1 person hand held assist Transfers: Sit to/from Stand Sit to Stand: Min assist         General transfer comment: min assist for power up, safety and balance, cueing to maintain anterior lean during transition; increased  time and effort, limited by pain    Balance Overall balance assessment: Needs assistance Sitting-balance support: No upper extremity supported;Feet supported Sitting balance-Leahy Scale: Fair     Standing balance support: Single extremity supported;During functional activity Standing balance-Leahy Scale: Fair Standing balance comment: preference to UE suport with L UE, but statically standing with min guard                           ADL either performed or assessed with clinical judgement   ADL Overall ADL's : Needs assistance/impaired     Grooming: Minimal assistance;Sitting   Upper Body Bathing: Minimal assistance;Sitting   Lower Body Bathing: Maximal assistance;Sit to/from stand   Upper Body Dressing : Moderate assistance;Sitting   Lower Body Dressing: Maximal assistance;Sit to/from stand   Toilet Transfer: Minimal Scientist, forensic Details (indicate cue type and reason): simulated to recliner  Toileting- Clothing Manipulation and Hygiene: Moderate assistance;Sit to/from stand       Functional mobility during ADLs: Minimal assistance General ADL Comments: pt limited during session by pain in ribs, poor tolerance to activity      Vision         Perception     Praxis      Pertinent Vitals/Pain Pain Assessment: 0-10 Pain Score: 8  Pain Location: R ribs Pain Descriptors / Indicators: Grimacing;Guarding;Discomfort Pain Intervention(s): Limited activity within patient's tolerance;Monitored during session;Repositioned     Hand Dominance     Extremity/Trunk Assessment Upper Extremity Assessment Upper Extremity Assessment: RUE deficits/detail;LUE deficits/detail RUE Deficits / Details: deferred due to pain in ribs RUE: Unable to fully assess due  to pain LUE Deficits / Details: grossly 4+/5   Lower Extremity Assessment Lower Extremity Assessment: Defer to PT evaluation   Cervical / Trunk Assessment Cervical / Trunk  Assessment: Other exceptions Cervical / Trunk Exceptions: R rib fxs   Communication Communication Communication: No difficulties   Cognition Arousal/Alertness: Awake/alert Behavior During Therapy: WFL for tasks assessed/performed Overall Cognitive Status: Within Functional Limits for tasks assessed                                     General Comments  pt reports using incentive spirometer about every hour; noted on 3L supplemental oxgyen via [place] with saturations typically 90-92% dropping after transfer to 86 but recovering with PLB within 30 seconds; HR ranged from 115-123    Exercises     Shoulder Instructions      Home Living Family/patient expects to be discharged to:: Private residence Living Arrangements: Spouse/significant other Available Help at Discharge: Available 24 hours/day(significant other- typically works, but not right now) Type of Home: House Home Access: Stairs to enter Technical brewer of Steps: 2 Entrance Stairs-Rails: Right;Left;Can reach both Home Layout: One level     Bathroom Shower/Tub: Tub only   Biochemist, clinical: Standard     Home Equipment: None          Prior Functioning/Environment Level of Independence: Independent        Comments: working, driving        OT Problem List: Decreased strength;Decreased activity tolerance;Impaired balance (sitting and/or standing);Decreased knowledge of use of DME or AE;Decreased knowledge of precautions;Pain      OT Treatment/Interventions: Self-care/ADL training;Therapeutic exercise;DME and/or AE instruction;Therapeutic activities;Patient/family education;Balance training    OT Goals(Current goals can be found in the care plan section) Acute Rehab OT Goals Patient Stated Goal: to decrease pain  OT Goal Formulation: With patient Time For Goal Achievement: 01/09/19 Potential to Achieve Goals: Good  OT Frequency: Min 2X/week   Barriers to D/C:            Co-evaluation               AM-PAC OT "6 Clicks" Daily Activity     Outcome Measure Help from another person eating meals?: A Little Help from another person taking care of personal grooming?: A Little Help from another person toileting, which includes using toliet, bedpan, or urinal?: A Lot Help from another person bathing (including washing, rinsing, drying)?: A Lot Help from another person to put on and taking off regular upper body clothing?: A Lot Help from another person to put on and taking off regular lower body clothing?: A Lot 6 Click Score: 14   End of Session Equipment Utilized During Treatment: Oxygen Nurse Communication: Mobility status;Other (comment)(oxgyen status, wheezing)  Activity Tolerance: Patient limited by pain Patient left: in chair;with call bell/phone within reach;with chair alarm set  OT Visit Diagnosis: Other abnormalities of gait and mobility (R26.89);Pain Pain - Right/Left: Right Pain - part of body: (ribs)                Time: 4944-9675 OT Time Calculation (min): 21 min Charges:  OT General Charges $OT Visit: 1 Visit OT Evaluation $OT Eval Moderate Complexity: [place], OT Acute Rehabilitation Services Pager [phone] Office [phone]   Delight Stare 12/26/2018, 9:31 AM

## 2018-12-27 MED ORDER — METOPROLOL TARTRATE 50 MG PO TABS
50.0000 mg | ORAL_TABLET | Freq: Two times a day (BID) | ORAL | Status: DC
  Administered 2018-12-27 – 2018-12-28 (×2): 50 mg via ORAL
  Filled 2018-12-27 (×2): qty 1

## 2018-12-27 MED ORDER — METHOCARBAMOL 750 MG PO TABS
750.0000 mg | ORAL_TABLET | Freq: Three times a day (TID) | ORAL | Status: DC
  Administered 2018-12-27 – 2018-12-28 (×5): 750 mg via ORAL
  Filled 2018-12-27 (×5): qty 1

## 2018-12-27 NOTE — Progress Notes (Signed)
Occupational Therapy Treatment Patient Details Name: [name] MRN: [id number] DOB: 18-Mar-1956 Today's Date: 12/27/2018    History of present illness Pt is a 63 y/o male admitted after fall down steps. Found to have R 10-11 rib fractures. PMH includes alcohol abuse and HTN.    OT comments  Patient seated in recliner upon entry. Agreeable to OT.  On 3L supplemental oxygen via [place], saturations ranged from 88-94% (dropping with mobility and requires cueing for PLB (or breathing-likes to hold his breath), as well as rest breaks seated) and HR ranged from 120-130 with mobility to/from sink and grooming.  Continues to be limited by pain, but encouraged R UE exercises after IS use each time to increase mobility and decrease pain. Completing transfers with min guard, mobility using RW with min guard. Progressing slowly.  Will follow acutely.    Follow Up Recommendations  Home health OT;Supervision/Assistance - 24 hour(pending progression)    Equipment Recommendations  3 in 1 bedside commode    Recommendations for Other Services PT consult    Precautions / Restrictions Precautions Precautions: Fall;Other (comment) Precaution Comments: monitor pulses and sats Restrictions Weight Bearing Restrictions: No       Mobility Bed Mobility               General bed mobility comments: OOB upon entry  Transfers Overall transfer level: Needs assistance Equipment used: Rolling walker (2 wheeled);1 person hand held assist Transfers: Sit to/from Stand Sit to Stand: Min guard         General transfer comment: min guard for steadying assist, increased time and effort due to pain    Balance Overall balance assessment: Needs assistance Sitting-balance support: Feet supported Sitting balance-Leahy Scale: Fair     Standing balance support: Single extremity supported;During functional activity Standing balance-Leahy Scale: Fair Standing balance comment: pt preference to at  least 1 UE support during mobility                           ADL either performed or assessed with clinical judgement   ADL Overall ADL's : Needs assistance/impaired     Grooming: Min guard;Standing;Supervision/safety;Sitting Grooming Details (indicate cue type and reason): min guard standing, supervision sitting; cueing for safety and rest breaks as needed                  Toilet Transfer: Min guard;Ambulation;RW Toilet Transfer Details (indicate cue type and reason): simulated to/from recliner         Functional mobility during ADLs: Min guard;Rolling walker General ADL Comments: pt limited by pain, cueing for PLB and not to hold his breath      Vision       Perception     Praxis      Cognition Arousal/Alertness: Awake/alert Behavior During Therapy: WFL for tasks assessed/performed Overall Cognitive Status: Within Functional Limits for tasks assessed                                 General Comments: decreased awareness of needs, reports "I just need to get back to work"         Exercises Exercises: General Upper Extremity General Exercises - Upper Extremity Shoulder Flexion: AROM;10 reps;Right;Seated   Shoulder Instructions       General Comments completed 10 breaths with IS, pulling upto 1250; supplemental oxgyen via [place] 3L, ranged from 88-94 (dropping below 90 with  mobility but rest, cueing for PLB recovers quickly), HR stable at 120 upto 130 with grooming at sink     Pertinent Vitals/ Pain       Pain Assessment: 0-10 Faces Pain Scale: Hurts whole lot Pain Location: R ribs Pain Descriptors / Indicators: Grimacing Pain Intervention(s): Limited activity within patient's tolerance;Monitored during session;Repositioned  Home Living                                          Prior Functioning/Environment              Frequency  Min 2X/week        Progress Toward Goals  OT Goals(current goals can  now be found in the care plan section)  Progress towards OT goals: Progressing toward goals  Acute Rehab OT Goals Patient Stated Goal: get home  OT Goal Formulation: With patient  Plan Discharge plan remains appropriate;Frequency remains appropriate    Co-evaluation                 AM-PAC OT "6 Clicks" Daily Activity     Outcome Measure   Help from another person eating meals?: None Help from another person taking care of personal grooming?: A Little Help from another person toileting, which includes using toliet, bedpan, or urinal?: A Little Help from another person bathing (including washing, rinsing, drying)?: A Lot Help from another person to put on and taking off regular upper body clothing?: A Little Help from another person to put on and taking off regular lower body clothing?: A Lot 6 Click Score: 17    End of Session Equipment Utilized During Treatment: Oxygen  OT Visit Diagnosis: Other abnormalities of gait and mobility (R26.89);Pain Pain - Right/Left: Right Pain - part of body: (ribs)   Activity Tolerance Patient limited by pain   Patient Left in chair;with call bell/phone within reach   Nurse Communication Mobility status;Other (comment)(oxgyen, HR)        Time: 9021-1155 OT Time Calculation (min): 31 min  Charges: OT General Charges $OT Visit: 1 Visit OT Treatments $Self Care/Home Management : 23-37 mins  Delight Stare, [place] Pager [phone] Office [phone]    Delight Stare 12/27/2018, 4:04 PM

## 2018-12-27 NOTE — Progress Notes (Signed)
RN updated Pt's wife Montise.  Wife ask to let pt know that she also tried to contact him without success, that he could call her back at his earliest convenience.  When RN checked with pt, he was in the bathroom.  Pt stated he will call family later.

## 2018-12-27 NOTE — Progress Notes (Signed)
Assessment & Plan: Fall R rib FX 10-11 ETOH - CIWA BIH - chronic HTN - home Norvasc  Admit for multimodal pain control, pulmonary toilet, bronchodilators, PT/OT evaluation.  Improving with somewhat less pain this AM.  Continue to mobilize. Will add Robaxin for pain control.  Duoneb as scheduled - persistent wheezing in left lung fields.        [name], MD       [facility]       Office: [phone]   Chief Complaint: Multiple rib fractures, SOB  Subjective: Patient seated at bedside, complains of chest wall pain with deep inspiration.  Tolerating diet.  Objective: Vital signs in last 24 hours: Temp:  [98.5 F (36.9 C)-98.7 F (37.1 C)] 98.5 F (36.9 C) (06/21 0432) Pulse Rate:  [111-121] 115 (06/21 0432) Resp:  [16-20] 20 (06/21 0432) BP: (147-158)/(86-102) 158/86 (06/21 0432) SpO2:  [93 %-97 %] 95 % (06/21 0432) Last BM Date: 12/25/18  Intake/Output from previous day: No intake/output data recorded. Intake/Output this shift: No intake/output data recorded.  Physical Exam: HEENT - sclerae clear, mucous membranes moist Neck - soft Chest - good BS on right, expiratory wheeze on left Cor - RRR Abdomen - soft, protuberant Ext - no edema, non-tender Neuro - alert & oriented, no focal deficits  Lab Results:  Recent Labs    12/25/18 0126 12/26/18 0344  WBC 6.4 8.9  HGB 12.9* 12.9*  HCT 39.6 40.2  PLT 188 147*   BMET Recent Labs    12/25/18 0126 12/26/18 0344  NA 137 136  K 3.3* 3.9  CL 104 104  CO2 22 22  GLUCOSE 138* 139*  BUN 9 8  CREATININE 0.92 0.91  CALCIUM 8.7* 8.8*   PT/INR Recent Labs    12/25/18 0126  LABPROT 12.6  INR 1.0   Comprehensive Metabolic Panel:    Component Value Date/Time   NA 136 12/26/2018 0344   NA 137 12/25/2018 0126   NA 136 04/16/2014 0729   NA 139 02/24/2014 2337   K 3.9 12/26/2018 0344   K 3.3 (L) 12/25/2018 0126   K 3.8 04/16/2014 0729   K 4.3 02/24/2014 2337   CL 104  12/26/2018 0344   CL 104 12/25/2018 0126   CL 103 04/16/2014 0729   CL 102 02/24/2014 2337   CO2 22 12/26/2018 0344   CO2 22 12/25/2018 0126   CO2 21 04/16/2014 0729   CO2 24 02/24/2014 2337   BUN 8 12/26/2018 0344   BUN 9 12/25/2018 0126   BUN 10 04/16/2014 0729   BUN 7 02/24/2014 2337   CREATININE 0.91 12/26/2018 0344   CREATININE 0.92 12/25/2018 0126   CREATININE 0.87 04/16/2014 0729   CREATININE 0.90 02/24/2014 2337   GLUCOSE 139 (H) 12/26/2018 0344   GLUCOSE 138 (H) 12/25/2018 0126   GLUCOSE 87 04/16/2014 0729   GLUCOSE 88 02/24/2014 2337   CALCIUM 8.8 (L) 12/26/2018 0344   CALCIUM 8.7 (L) 12/25/2018 0126   CALCIUM 9.0 04/16/2014 0729   CALCIUM 8.8 02/24/2014 2337   AST 29 05/30/2018 1637   AST 21 04/08/2018 0813   AST 43 (H) 04/16/2014 0729   AST 101 (H) 02/24/2014 2337   ALT 24 05/30/2018 1637   ALT 13 04/08/2018 0813   ALT 53 04/16/2014 0729   ALT 131 (H) 02/24/2014 2337   ALKPHOS 42 05/30/2018 1637   ALKPHOS 48 04/08/2018 0813   ALKPHOS 81 04/16/2014 0729   ALKPHOS 68 02/24/2014 2337  BILITOT 0.5 05/30/2018 1637   BILITOT 1.0 04/08/2018 0813   BILITOT 0.8 04/16/2014 0729   BILITOT 0.5 02/24/2014 2337   PROT 6.8 05/30/2018 1637   PROT 7.4 04/08/2018 0813   PROT 7.6 04/16/2014 0729   PROT 7.7 02/24/2014 2337   ALBUMIN 4.2 05/30/2018 1637   ALBUMIN 4.4 04/08/2018 0813   ALBUMIN 4.2 04/16/2014 0729   ALBUMIN 3.9 02/24/2014 2337    Studies/Results: Dg Chest Port 1 View  Result Date: 12/26/2018 CLINICAL DATA:  63 y/o  M; shortness of breath. EXAM: PORTABLE CHEST 1 VIEW COMPARISON:  12/25/2018 chest radiograph and CT chest. FINDINGS: Stable normal cardiac silhouette given projection and technique. Right tenth and eleventh rib fractures better characterized on prior CT. No new acute osseous abnormality. No pneumothorax or pleural effusion. Increased linear left basilar opacities. No new consolidation. IMPRESSION: Right tenth and eleventh rib fractures on prior  CT are poorly visualized. Increased left basilar platelike atelectasis. Electronically Signed   By: [name] M.D.   On: 12/26/2018 06:13      [name] 12/27/2018  Patient ID: [name], male   DOB: 1955/07/17, 64 y.o.   MRN: [id number]

## 2018-12-28 ENCOUNTER — Emergency Department (HOSPITAL_COMMUNITY): Payer: No Typology Code available for payment source

## 2018-12-28 ENCOUNTER — Inpatient Hospital Stay (HOSPITAL_COMMUNITY): Payer: No Typology Code available for payment source

## 2018-12-28 ENCOUNTER — Inpatient Hospital Stay (HOSPITAL_COMMUNITY)
Admission: EM | Admit: 2018-12-28 | Discharge: 2018-12-30 | DRG: 185 | Disposition: A | Discharge status: Home Health | Payer: No Typology Code available for payment source | Attending: Physician Assistant | Admitting: Physician Assistant

## 2018-12-28 ENCOUNTER — Encounter (HOSPITAL_COMMUNITY): Payer: Self-pay

## 2018-12-28 DIAGNOSIS — R000 Tachycardia, unspecified: Secondary | ICD-10-CM | POA: Diagnosis not present

## 2018-12-28 DIAGNOSIS — S20211A Contusion of right front wall of thorax, initial encounter: Secondary | ICD-10-CM | POA: Diagnosis not present

## 2018-12-28 DIAGNOSIS — S2241XA Multiple fractures of ribs, right side, initial encounter for closed fracture: Principal | ICD-10-CM

## 2018-12-28 DIAGNOSIS — R0781 Pleurodynia: Secondary | ICD-10-CM | POA: Diagnosis present

## 2018-12-28 DIAGNOSIS — Z7982 Long term (current) use of aspirin: Secondary | ICD-10-CM

## 2018-12-28 DIAGNOSIS — Z8673 Personal history of transient ischemic attack (TIA), and cerebral infarction without residual deficits: Secondary | ICD-10-CM

## 2018-12-28 DIAGNOSIS — K219 Gastro-esophageal reflux disease without esophagitis: Secondary | ICD-10-CM | POA: Diagnosis not present

## 2018-12-28 DIAGNOSIS — F17210 Nicotine dependence, cigarettes, uncomplicated: Secondary | ICD-10-CM | POA: Diagnosis not present

## 2018-12-28 DIAGNOSIS — K5900 Constipation, unspecified: Secondary | ICD-10-CM | POA: Diagnosis present

## 2018-12-28 DIAGNOSIS — Z1159 Encounter for screening for other viral diseases: Secondary | ICD-10-CM

## 2018-12-28 DIAGNOSIS — S2249XA Multiple fractures of ribs, unspecified side, initial encounter for closed fracture: Secondary | ICD-10-CM | POA: Diagnosis present

## 2018-12-28 DIAGNOSIS — I10 Essential (primary) hypertension: Secondary | ICD-10-CM | POA: Diagnosis present

## 2018-12-28 DIAGNOSIS — Z79899 Other long term (current) drug therapy: Secondary | ICD-10-CM

## 2018-12-28 DIAGNOSIS — R140 Abdominal distension (gaseous): Secondary | ICD-10-CM

## 2018-12-28 DIAGNOSIS — K4020 Bilateral inguinal hernia, without obstruction or gangrene, not specified as recurrent: Secondary | ICD-10-CM | POA: Diagnosis present

## 2018-12-28 DIAGNOSIS — Y92410 Unspecified street and highway as the place of occurrence of the external cause: Secondary | ICD-10-CM

## 2018-12-28 LAB — CBC
HCT: 39.3 % (ref 39.0–52.0)
Hemoglobin: 12.8 g/dL — ABNORMAL LOW (ref 13.0–17.0)
MCH: 29.9 pg (ref 26.0–34.0)
MCHC: 32.6 g/dL (ref 30.0–36.0)
MCV: 91.8 fL (ref 80.0–100.0)
Platelets: 170 10*3/uL (ref 150–400)
RBC: 4.28 MIL/uL (ref 4.22–5.81)
RDW: 15.6 % — ABNORMAL HIGH (ref 11.5–15.5)
WBC: 8 10*3/uL (ref 4.0–10.5)
nRBC: 0 % (ref 0.0–0.2)

## 2018-12-28 LAB — COMPREHENSIVE METABOLIC PANEL
ALT: 21 U/L (ref 0–44)
AST: 24 U/L (ref 15–41)
Albumin: 4.1 g/dL (ref 3.5–5.0)
Alkaline Phosphatase: 49 U/L (ref 38–126)
Anion gap: 12 (ref 5–15)
BUN: 11 mg/dL (ref 8–23)
CO2: 26 mmol/L (ref 22–32)
Calcium: 9.4 mg/dL (ref 8.9–10.3)
Chloride: 99 mmol/L (ref 98–111)
Creatinine, Ser: 0.83 mg/dL (ref 0.61–1.24)
GFR calc Af Amer: >60 mL/min (ref >60)
GFR calc non Af Amer: >60 mL/min (ref >60)
Glucose, Bld: 119 mg/dL — ABNORMAL HIGH (ref 70–99)
Potassium: 4.1 mmol/L (ref 3.5–5.1)
Sodium: 137 mmol/L (ref 135–145)
Total Bilirubin: 0.5 mg/dL (ref 0.3–1.2)
Total Protein: 7.5 g/dL (ref 6.5–8.1)

## 2018-12-28 LAB — SARS CORONAVIRUS 2 BY RT PCR (HOSPITAL ORDER, PERFORMED IN ~~LOC~~ HOSPITAL LAB): SARS Coronavirus 2: NEGATIVE

## 2018-12-28 MED ORDER — PANTOPRAZOLE SODIUM 40 MG PO TBEC
40.0000 mg | DELAYED_RELEASE_TABLET | Freq: Every day | ORAL | Status: DC
  Administered 2018-12-29 – 2018-12-30 (×3): 40 mg via ORAL
  Filled 2018-12-28 (×3): qty 1

## 2018-12-28 MED ORDER — GABAPENTIN 300 MG PO CAPS
300.0000 mg | ORAL_CAPSULE | Freq: Three times a day (TID) | ORAL | Status: DC
  Administered 2018-12-28 – 2018-12-30 (×6): 300 mg via ORAL
  Filled 2018-12-28 (×5): qty 1

## 2018-12-28 MED ORDER — ADULT MULTIVITAMIN W/MINERALS CH
1.0000 | ORAL_TABLET | Freq: Every day | ORAL | Status: DC
  Administered 2018-12-29 – 2018-12-30 (×2): 1 via ORAL
  Filled 2018-12-28 (×3): qty 1

## 2018-12-28 MED ORDER — AMLODIPINE BESYLATE 5 MG PO TABS
5.0000 mg | ORAL_TABLET | Freq: Every day | ORAL | Status: DC
  Administered 2018-12-29 – 2018-12-30 (×3): 5 mg via ORAL
  Filled 2018-12-28 (×3): qty 1

## 2018-12-28 MED ORDER — OXYCODONE HCL 10 MG PO TABS: 5.0000 mg | ORAL_TABLET | Freq: Four times a day (QID) | ORAL | 0 refills | Status: DC | PRN

## 2018-12-28 MED ORDER — KCL IN DEXTROSE-NACL 20-5-0.45 MEQ/L-%-% IV SOLN
INTRAVENOUS | Status: DC
  Administered 2018-12-28: 23:00:00 via INTRAVENOUS

## 2018-12-28 MED ORDER — IOHEXOL 300 MG/ML  SOLN
100.0000 mL | Freq: Once | INTRAMUSCULAR | Status: AC | PRN
  Administered 2018-12-28: 100 mL via INTRAVENOUS

## 2018-12-28 MED ORDER — IPRATROPIUM-ALBUTEROL 0.5-2.5 (3) MG/3ML IN SOLN: 3.0000 mL | Freq: Four times a day (QID) | RESPIRATORY_TRACT | Status: DC | PRN

## 2018-12-28 MED ORDER — LORAZEPAM 1 MG PO TABS: 1.0000 mg | ORAL_TABLET | Freq: Four times a day (QID) | ORAL | Status: DC | PRN

## 2018-12-28 MED ORDER — ENOXAPARIN SODIUM 40 MG/0.4ML ~~LOC~~ SOLN
40.0000 mg | Freq: Every day | SUBCUTANEOUS | Status: DC
  Administered 2018-12-29 – 2018-12-30 (×2): 40 mg via SUBCUTANEOUS
  Filled 2018-12-28 (×4): qty 0.4

## 2018-12-28 MED ORDER — GABAPENTIN 300 MG PO CAPS: 300.0000 mg | ORAL_CAPSULE | Freq: Three times a day (TID) | ORAL | 0 refills | Status: DC

## 2018-12-28 MED ORDER — FENTANYL CITRATE (PF) 100 MCG/2ML IJ SOLN
75.0000 ug | Freq: Once | INTRAMUSCULAR | Status: AC
  Administered 2018-12-28: 75 ug via INTRAVENOUS
  Filled 2018-12-28: qty 2

## 2018-12-28 MED ORDER — ONDANSETRON HCL 4 MG/2ML IJ SOLN: 4.0000 mg | Freq: Four times a day (QID) | INTRAMUSCULAR | Status: DC | PRN

## 2018-12-28 MED ORDER — METHOCARBAMOL 750 MG PO TABS: 750.0000 mg | ORAL_TABLET | Freq: Three times a day (TID) | ORAL | 0 refills | Status: DC | PRN

## 2018-12-28 MED ORDER — ASPIRIN EC 81 MG PO TBEC
81.0000 mg | DELAYED_RELEASE_TABLET | Freq: Every day | ORAL | Status: DC
  Administered 2018-12-28 – 2018-12-30 (×3): 81 mg via ORAL
  Filled 2018-12-28 (×3): qty 1

## 2018-12-28 MED ORDER — ONDANSETRON HCL 4 MG/2ML IJ SOLN
4.0000 mg | Freq: Once | INTRAMUSCULAR | Status: AC
  Administered 2018-12-28: 4 mg via INTRAVENOUS
  Filled 2018-12-28: qty 2

## 2018-12-28 MED ORDER — ACETAMINOPHEN 325 MG PO TABS
650.0000 mg | ORAL_TABLET | Freq: Four times a day (QID) | ORAL | Status: DC
  Administered 2018-12-29 – 2018-12-30 (×6): 650 mg via ORAL
  Filled 2018-12-28 (×6): qty 2

## 2018-12-28 MED ORDER — LORAZEPAM 2 MG/ML IJ SOLN: 1.0000 mg | Freq: Four times a day (QID) | INTRAMUSCULAR | Status: DC | PRN

## 2018-12-28 MED ORDER — FENTANYL CITRATE (PF) 100 MCG/2ML IJ SOLN
75.0000 ug | Freq: Once | INTRAMUSCULAR | Status: AC
  Administered 2018-12-28: 20:00:00 75 ug via INTRAVENOUS
  Filled 2018-12-28: qty 2

## 2018-12-28 MED ORDER — DIPHENHYDRAMINE HCL 50 MG/ML IJ SOLN
12.5000 mg | Freq: Once | INTRAMUSCULAR | Status: AC
  Administered 2018-12-28: 12.5 mg via INTRAVENOUS
  Filled 2018-12-28: qty 1

## 2018-12-28 MED ORDER — ACETAMINOPHEN 325 MG PO TABS: 650.0000 mg | ORAL_TABLET | Freq: Four times a day (QID) | ORAL | Status: DC

## 2018-12-28 MED ORDER — OXYCODONE HCL 5 MG PO TABS
5.0000 mg | ORAL_TABLET | Freq: Four times a day (QID) | ORAL | Status: DC | PRN
  Administered 2018-12-29: 5 mg via ORAL
  Administered 2018-12-29 – 2018-12-30 (×4): 10 mg via ORAL
  Filled 2018-12-28: qty 1
  Filled 2018-12-28 (×4): qty 2

## 2018-12-28 MED ORDER — ONDANSETRON 4 MG PO TBDP: 4.0000 mg | ORAL_TABLET | Freq: Four times a day (QID) | ORAL | Status: DC | PRN

## 2018-12-28 MED ORDER — SODIUM CHLORIDE (PF) 0.9 % IJ SOLN
INTRAMUSCULAR | Status: AC
  Filled 2018-12-28: qty 50

## 2018-12-28 MED ORDER — MORPHINE SULFATE (PF) 2 MG/ML IV SOLN
2.0000 mg | INTRAVENOUS | Status: DC | PRN
  Filled 2018-12-28: qty 1

## 2018-12-28 MED ORDER — DOCUSATE SODIUM 100 MG PO CAPS
100.0000 mg | ORAL_CAPSULE | Freq: Two times a day (BID) | ORAL | Status: DC
  Administered 2018-12-29 – 2018-12-30 (×4): 100 mg via ORAL
  Filled 2018-12-28 (×5): qty 1

## 2018-12-28 MED ORDER — ADULT MULTIVITAMIN W/MINERALS CH: 1.0000 | ORAL_TABLET | Freq: Every day | ORAL | Status: DC

## 2018-12-28 MED ORDER — HYDROMORPHONE HCL 1 MG/ML IJ SOLN
1.0000 mg | Freq: Once | INTRAMUSCULAR | Status: AC
  Administered 2018-12-28: 1 mg via INTRAVENOUS
  Filled 2018-12-28: qty 1

## 2018-12-28 MED ORDER — ACETAMINOPHEN 325 MG PO TABS
650.0000 mg | ORAL_TABLET | Freq: Four times a day (QID) | ORAL | Status: DC
  Administered 2018-12-28: 650 mg via ORAL
  Filled 2018-12-28: qty 2

## 2018-12-28 MED ORDER — METHOCARBAMOL 500 MG PO TABS: 750.0000 mg | ORAL_TABLET | Freq: Three times a day (TID) | ORAL | Status: DC | PRN

## 2018-12-28 MED ORDER — ALBUTEROL SULFATE (2.5 MG/3ML) 0.083% IN NEBU: 3.0000 mL | INHALATION_SOLUTION | Freq: Four times a day (QID) | RESPIRATORY_TRACT | Status: DC | PRN

## 2018-12-28 MED FILL — METHOCARBAMOL 750 MG TABS: 750 | 10 days supply | Qty: 30 | Fill #0

## 2018-12-28 MED FILL — GABAPENTIN 300 MG CAPSULE: 300 | 20 days supply | Qty: 60 | Fill #0

## 2018-12-28 MED FILL — oxyCODONE HCL 10 MG TABS: 10 | 7 days supply | Qty: 30 | Fill #0
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
## 2018-12-28 NOTE — Progress Notes (Signed)
SATURATION QUALIFICATIONS: (This note is used to comply with regulatory documentation for home oxygen)  Patient Saturations on Room Air at Rest = 94%  Patient Saturations on Room Air while Ambulating =83-88 %  Patient Saturations on 2 Liters of oxygen while Ambulating = 92 %  Please briefly explain why patient needs home oxygen: Patient desaturates to between 83%-88% while ambulating or short amounts of exertion.

## 2018-12-28 NOTE — Progress Notes (Signed)
[facility] Progress Note     Subjective: CC-  Up in chair. Wants to go home. Pain ok at rest, but he reports right sided chest wall pain with deep breaths and ambulation. O2 sats drop when he mobilizes. States that he has an inhaler at home that he rarely uses. Not on home O2. Smokes 1 PPD. Denies CP or cough. He does feel intermittently short of breath, worse with ambulation. Pulling 1100 on IS.   Girlfriend will be at home with him 24 hours a day for 7 days. After that she will be at work during the day. 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  Objective: Vital signs in last 24 hours: Temp:  [98.4 F (36.9 C)-98.7 F (37.1 C)] 98.7 F (37.1 C) (06/22 0558) Pulse Rate:  [98-117] 113 (06/22 0840) Resp:  [18-22] 20 (06/22 0840) BP: (137-166)/(91-107) 155/103 (06/22 0950) SpO2:  [88 %-99 %] 88 % (06/22 0840) Last BM Date: 12/25/18  Intake/Output from previous day: 06/21 0701 - 06/22 0700 In: 3 [I.V.:3] Out: -  Intake/Output this shift: No intake/output data recorded.  PE: Gen:  Alert, NAD, pleasant HEENT: EOM's intact, pupils equal and round Card:  RRR Pulm:  CTAB, no W/R/R, effort normal on 2L O2 [place], pulling 1100 on IS Abd: Soft, NT/ND, +BS, no HSM Ext:  No BLE edema Psych: A&Ox3  Skin: no rashes noted, warm and dry  Lab Results:  Recent Labs    12/26/18 0344  WBC 8.9  HGB 12.9*  HCT 40.2  PLT 147*   BMET Recent Labs    12/26/18 0344  NA 136  K 3.9  CL 104  CO2 22  GLUCOSE 139*  BUN 8  CREATININE 0.91  CALCIUM 8.8*   PT/INR No results for input(s): LABPROT, INR in the last 72 hours. CMP     Component Value Date/Time   NA 136 12/26/2018 0344   NA 136 04/16/2014 0729   K 3.9 12/26/2018 0344   K 3.8 04/16/2014 0729   CL 104 12/26/2018 0344   CL 103 04/16/2014 0729   CO2 22 12/26/2018 0344   CO2 21 04/16/2014 0729   GLUCOSE 139 (H) 12/26/2018 0344   GLUCOSE 87 04/16/2014 0729   BUN 8 12/26/2018 0344   BUN 10 04/16/2014 0729   CREATININE 0.91  12/26/2018 0344   CREATININE 0.87 04/16/2014 0729   CALCIUM 8.8 (L) 12/26/2018 0344   CALCIUM 9.0 04/16/2014 0729   PROT 6.8 05/30/2018 1637   PROT 7.6 04/16/2014 0729   ALBUMIN 4.2 05/30/2018 1637   ALBUMIN 4.2 04/16/2014 0729   AST 29 05/30/2018 1637   AST 43 (H) 04/16/2014 0729   ALT 24 05/30/2018 1637   ALT 53 04/16/2014 0729   ALKPHOS 42 05/30/2018 1637   ALKPHOS 81 04/16/2014 0729   BILITOT 0.5 05/30/2018 1637   BILITOT 0.8 04/16/2014 0729   GFRNONAA >60 12/26/2018 0344   GFRNONAA >60 02/24/2014 2337   GFRAA >60 12/26/2018 0344   GFRAA >60 02/24/2014 2337   Lipase     Component Value Date/Time   LIPASE 25 05/03/2017 0029   LIPASE 75 04/16/2014 0729       Studies/Results: No results found.  Anti-infectives: Anti-infectives (From admission, onward)   None       Assessment/Plan Fall R rib FX 10-11 - pain control and pulmonary toilet Hypoxia - check CXR. Continue scheduled duonebs. Add flutter valve ETOH - CIWA BIH - chronic HTN - home Norvasc Tobacco abuse - smokes 1 PPD  ID - none FEN - regular diet VTE - SCDs, lovenox Foley - none Follow up - PCP  Plan - Add scheduled tylenol for better pain control. Flutter valve. Repeat CXR due to hypoxia. Will ask RN to ambulate patient and document O2 sats w/wo supplemental O2; may need home O2. May be ready for discharge later today.   LOS: 3 days    [place]ellington [place] , [facility] Surgery 12/28/2018, 10:15 AM Pager: [phone] Mon-Thurs 7:00 am-4:30 pm Fri 7:00 am -11:30 AM Sat-Sun 7:00 am-11:30 am

## 2018-12-28 NOTE — ED Notes (Signed)
Patient transported to CT/Xray. 

## 2018-12-28 NOTE — Progress Notes (Signed)
Physical Therapy Treatment Patient Details Name: [name] MRN: [id number] DOB: December 15, 1955 Today's Date: 12/28/2018    History of Present Illness Pt is a 63 y/o male admitted after fall down steps. Found to have R 10-11 rib fractures. PMH includes alcohol abuse and HTN.     PT Comments    Pt up and walking with nursing upon arrival.  Pt seen for gait training and exercise for progression of mobility.  Pt requiring less assistance than last visit; able to perform transfers and gait without AD and only supervision.  Pt does not tolerate ambulation or exercises on room air; desat to 84% with gait and 88% with exercises on room air.  See gait for ambulation sats with/without O2.  Pt very motivated to progress and return home. Will continue to follow acutely.     Follow Up Recommendations  Home health PT     Equipment Recommendations  Rolling walker with 5" wheels    Recommendations for Other Services       Precautions / Restrictions Precautions Precautions: Fall;Other (comment) Precaution Comments: monitor pulses and sats Restrictions Weight Bearing Restrictions: No    Mobility  Bed Mobility                  Transfers Overall transfer level: Needs assistance Equipment used: None Transfers: Sit to/from Stand Sit to Stand: Supervision            Ambulation/Gait Ambulation/Gait assistance: Min guard;Min assist Gait Distance (Feet): 140 Feet(110+30) Assistive device: None Gait Pattern/deviations: Step-through pattern;Decreased stride length     General Gait Details: Pt OOB walking in hall upon arrival.  Pt able to ambulate 110 ft on 2L with O2sat 91-93%.  Short gait trial on RA, desat to 88% after 15 ft - able to return to 91% with PLB; then desat to 83% after 10 additional feet and unable to return to 90% without 2L O2.  spO2 returned to 98% with seated rest on 2L.       Balance Overall balance assessment: Needs assistance Sitting-balance  support: Feet supported Sitting balance-Leahy Scale: Good     Standing balance support: During functional activity;No upper extremity supported Standing balance-Leahy Scale: Good                              Cognition Arousal/Alertness: Awake/alert Behavior During Therapy: WFL for tasks assessed/performed Overall Cognitive Status: Within Functional Limits for tasks assessed                                 General Comments: decreased awareness of needs, reports "I just need to get back to work"       Exercises General Exercises - Upper Extremity Shoulder Flexion: AROM;10 reps;Right;Seated;Left Shoulder ABduction: AROM;10 reps;Seated(W's with scap retraction and shoulder ER) General Exercises - Lower Extremity Ankle Circles/Pumps: 10 reps Mini-Sqauts: 5 reps Other Exercises Other Exercises: sit to/from stand x 5 reps with short rest break after 3 reps  Other Exercises: seated hamstring stretch x 15 sec x 2 reps;  seated piriformis stretchx 15 sec x 2 reps each  Other Exercises: gentle side stretch to Lt x 5 sec x 5 reps (to tolerance)    General Comments        Pertinent Vitals/Pain Pain Assessment: 0-10 Faces Pain Scale: Hurts even more Pain Location: R ribs Pain Descriptors / Indicators: Grimacing Pain Intervention(s): Limited  activity within patient's tolerance;Monitored during session    Home Living                      Prior Function            PT Goals (current goals can now be found in the care plan section) Acute Rehab PT Goals Patient Stated Goal: get home  PT Goal Formulation: With patient Time For Goal Achievement: 01/08/19 Potential to Achieve Goals: Good Progress towards PT goals: Progressing toward goals    Frequency    Min 3X/week      PT Plan Current plan remains appropriate    Co-evaluation              AM-PAC PT "6 Clicks" Mobility   Outcome Measure  Help needed turning from your back to your  side while in a flat bed without using bedrails?: A Little Help needed moving from lying on your back to sitting on the side of a flat bed without using bedrails?: A Little Help needed moving to and from a bed to a chair (including a wheelchair)?: None Help needed standing up from a chair using your arms (e.g., wheelchair or bedside chair)?: None Help needed to walk in hospital room?: A Little Help needed climbing 3-5 steps with a railing? : A Lot 6 Click Score: 19    End of Session Equipment Utilized During Treatment: Oxygen Activity Tolerance: Patient tolerated treatment well;Patient limited by fatigue Patient left: in chair;with call bell/phone within reach;with chair alarm set Nurse Communication: Mobility status(02 sats with ambulation ) PT Visit Diagnosis: Difficulty in walking, not elsewhere classified (R26.2);Pain Pain - Right/Left: Right Pain - part of body: (ribs)     Time: 5465-0354 PT Time Calculation (min) (ACUTE ONLY): 33 min  Charges:  $Gait Training: 8-22 mins $Therapeutic Exercise: 8-22 mins                    [name], PTA 12/28/18 1:42 PM  [name], [name] 12/28/2018, 1:38 PM
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
## 2018-12-28 NOTE — Progress Notes (Signed)
Spoke with patient significant other to update on patient condition, questions and concerns addressed at this time.

## 2018-12-28 NOTE — ED Notes (Signed)
Bed: WA15 Expected date:  Expected time:  Means of arrival:  Comments: EMS  

## 2018-12-28 NOTE — Discharge Summary (Signed)
[facility] Discharge Summary   Patient ID: [name] MRN: [id number] DOB/AGE: 1956-02-20 63 y.o.  Admit date: 12/25/2018 Discharge date: 12/28/2018  Admitting Diagnosis: Fall Right rib fracture 10-11 ETOH Bilateral inguinal hernia HTN   Discharge Diagnosis Patient Active Problem List   Diagnosis Date Noted  . Rib fractures 12/25/2018  . Acute CVA (cerebrovascular accident) ([place]) 04/08/2018  . TIA (transient ischemic attack) 08/22/2017  . Bilateral recurrent inguinal hernia without obstruction or gangrene   . Influenza 10/18/2014  . Hypoxia 10/16/2014  . SOB (shortness of breath) 10/16/2014  . Cough 10/16/2014  . Fever 10/16/2014  . Sepsis ([place]) 10/16/2014  . Nausea and vomiting 10/16/2014  . Acid reflux   . DDD (degenerative disc disease), lumbar   . Hypertension   . Tobacco abuse   . Alcohol abuse   . Essential hypertension     Consultants None  Imaging: Dg Chest Port 1 View  Result Date: 12/28/2018 CLINICAL DATA:  63 year old male status post fall. Right posterior rib fractures on recent CT. EXAM: PORTABLE CHEST 1 VIEW COMPARISON:  12/26/2018 portable chest and earlier. FINDINGS: Portable AP upright view at 1037 hours. Stable lung volumes. Mediastinal contours remain normal. Visualized tracheal air column is within normal limits. No pneumothorax or definite pleural effusion. Linear atelectasis greater on the left is stable. No new pulmonary opacity. Negative visible bowel gas pattern. Stable visualized osseous structures. IMPRESSION: Stable atelectasis.  No new cardiopulmonary abnormality. Electronically Signed   By: Genevie Ann M.D.   On: 12/28/2018 10:51    Procedures None  Hospital Course:  [name] is a 63yo male who [facility] after walking down some steps when he fell on to his right side. Complains of right rib pain. He was evaluated in the ED as a non-trauma code activation. He was found to have right rib  fractures 10-11. He required O2 and had a lot of pain in the ED so trauma service was asked to admit.  He was admitted for multimodal pain control, pulmonary toilet, bronchodilators, PT/OT. Patient worked with therapies during this admission who recommended home health PT/OT when medically stable for discharge. Despite pulmonary toilet patient had persistent hypoxia with ambulation requiring supplemental oxygen. On 12/28/18 the patient was tolerating diet, ambulating well, pain well controlled, vital signs stable and felt stable for discharge home.  He was discharged with home oxygen and will follow up with his primary care physician. Patient knows to call with questions or concerns.    I have personally reviewed the patients medication history on the [place] controlled substance database.    Allergies as of 12/28/2018      Reactions   Penicillins Anaphylaxis   Has patient had a PCN reaction causing immediate rash, facial/tongue/throat swelling, SOB or lightheadedness with hypotension: Yes Has patient had a PCN reaction causing severe rash involving mucus membranes or skin necrosis: No Has patient had a PCN reaction that required hospitalization Yes Has patient had a PCN reaction occurring within the last 10 years: No If all of the above answers are "NO", then may proceed with Cephalosporin use.   Morphine And Related Itching      Medication List    STOP taking these medications   clopidogrel 75 MG tablet Commonly known as: Plavix     TAKE these medications   acetaminophen 325 MG tablet Commonly known as: TYLENOL Take 2 tablets (650 mg total) by mouth every 6 (six) hours.   albuterol 108 (90 Base) MCG/ACT  inhaler Commonly known as: VENTOLIN HFA Inhale 2 puffs into the lungs every 6 (six) hours as needed for wheezing or shortness of breath.   amLODipine 5 MG tablet Commonly known as: NORVASC Take 1 tablet (5 mg total) by mouth daily.   aspirin 81 MG EC tablet Take 1 tablet (81 mg total)  by mouth daily.   atorvastatin 10 MG tablet Commonly known as: Lipitor Take 1 tablet (10 mg total) by mouth daily.   gabapentin 300 MG capsule Commonly known as: NEURONTIN Take 1 capsule (300 mg total) by mouth 3 (three) times daily.   methocarbamol 750 MG tablet Commonly known as: ROBAXIN Take 1 tablet (750 mg total) by mouth every 8 (eight) hours as needed for muscle spasms.   multivitamin with minerals Tabs tablet Take 1 tablet by mouth daily. Start taking on: December 29, 2018   omeprazole 20 MG capsule Commonly known as: PRILOSEC Take 20 mg by mouth daily.   Oxycodone HCl 10 MG Tabs Take 0.5-1 tablets (5-10 mg total) by mouth every 6 (six) hours as needed for severe pain.            Durable Medical Equipment  (From admission, onward)         Start     Ordered   12/28/18 1449  For home use only DME oxygen  Once    Question Answer Comment  Length of Need 6 Months   Mode or (Route) Nasal cannula   Liters per Minute 2   Frequency Continuous (stationary and portable oxygen unit needed)   Oxygen conserving device Yes   Oxygen delivery system Gas      12/28/18 1449   12/28/18 0920  For home use only DME Walker rolling  Once    Question:  Patient needs a walker to treat with the following condition  Answer:  Multiple fractures of ribs, right side, initial encounter for closed fracture   12/28/18 0920           Follow-up Information    [place]. Call.   Why: as needed with questions or concerns.  Contact information: [place] [id number] [phone]       Primary care physician. Schedule an appointment as soon as possible for a visit in 2 week(s).   Why: Call to arrange follow up with your primary care physician in 2 weeks           Signed: Wellington Hampshire, [facility] 12/28/2018, 3:13 PM Pager: [phone] Mon-Thurs 7:00 am-4:30 pm Fri 7:00 am -11:30 AM Sat-Sun 7:00  am-11:30 am

## 2018-12-28 NOTE — Progress Notes (Signed)
RT instructed patient on the use of flutter valve. Patient able to demonstrate back good technique.   
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
## 2018-12-28 NOTE — Progress Notes (Signed)
Patient discharged to home. Verbalized understanding of all discharge instructions including caring for rib fracture, resuming activities, discharge medications, home oxygen use and follow up MD visits.
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
## 2018-12-29 ENCOUNTER — Other Ambulatory Visit: Payer: Self-pay

## 2018-12-29 ENCOUNTER — Inpatient Hospital Stay (HOSPITAL_COMMUNITY): Payer: No Typology Code available for payment source

## 2018-12-29 ENCOUNTER — Encounter (HOSPITAL_COMMUNITY): Payer: Self-pay | Admitting: General Practice

## 2018-12-29 LAB — CBC
HCT: 39.3 % (ref 39.0–52.0)
Hemoglobin: 12.7 g/dL — ABNORMAL LOW (ref 13.0–17.0)
MCH: 30 pg (ref 26.0–34.0)
MCHC: 32.3 g/dL (ref 30.0–36.0)
MCV: 92.9 fL (ref 80.0–100.0)
Platelets: 178 10*3/uL (ref 150–400)
RBC: 4.23 MIL/uL (ref 4.22–5.81)
RDW: 15.5 % (ref 11.5–15.5)
WBC: 6.9 10*3/uL (ref 4.0–10.5)
nRBC: 0 % (ref 0.0–0.2)

## 2018-12-29 LAB — BASIC METABOLIC PANEL
Anion gap: 10 (ref 5–15)
BUN: 10 mg/dL (ref 8–23)
CO2: 29 mmol/L (ref 22–32)
Calcium: 8.9 mg/dL (ref 8.9–10.3)
Chloride: 97 mmol/L — ABNORMAL LOW (ref 98–111)
Creatinine, Ser: 0.92 mg/dL (ref 0.61–1.24)
GFR calc Af Amer: >60 mL/min (ref >60)
GFR calc non Af Amer: >60 mL/min (ref >60)
Glucose, Bld: 120 mg/dL — ABNORMAL HIGH (ref 70–99)
Potassium: 4.2 mmol/L (ref 3.5–5.1)
Sodium: 136 mmol/L (ref 135–145)

## 2018-12-29 MED ORDER — DIPHENHYDRAMINE HCL 25 MG PO CAPS: 25.0000 mg | ORAL_CAPSULE | Freq: Four times a day (QID) | ORAL | Status: DC | PRN

## 2018-12-29 MED ORDER — VITAMIN B-1 100 MG PO TABS
100.0000 mg | ORAL_TABLET | Freq: Every day | ORAL | Status: DC
  Administered 2018-12-29 – 2018-12-30 (×2): 100 mg via ORAL
  Filled 2018-12-29 (×2): qty 1

## 2018-12-29 MED ORDER — IPRATROPIUM-ALBUTEROL 0.5-2.5 (3) MG/3ML IN SOLN
3.0000 mL | Freq: Once | RESPIRATORY_TRACT | Status: AC
  Administered 2018-12-29: 3 mL via RESPIRATORY_TRACT
  Filled 2018-12-29: qty 3

## 2018-12-29 MED ORDER — POLYETHYLENE GLYCOL 3350 17 G PO PACK
17.0000 g | PACK | Freq: Every day | ORAL | Status: DC
  Administered 2018-12-29 – 2018-12-30 (×2): 17 g via ORAL
  Filled 2018-12-29 (×2): qty 1

## 2018-12-29 MED ORDER — NICOTINE 14 MG/24HR TD PT24
14.0000 mg | MEDICATED_PATCH | Freq: Every day | TRANSDERMAL | Status: DC
  Administered 2018-12-29 – 2018-12-30 (×2): 14 mg via TRANSDERMAL
  Filled 2018-12-29 (×2): qty 1

## 2018-12-29 MED ORDER — METHOCARBAMOL 500 MG PO TABS
750.0000 mg | ORAL_TABLET | Freq: Three times a day (TID) | ORAL | Status: DC
  Administered 2018-12-29 – 2018-12-30 (×3): 750 mg via ORAL
  Filled 2018-12-29 (×2): qty 2

## 2018-12-29 MED ORDER — PROMETHAZINE HCL 25 MG/ML IJ SOLN: 12.5000 mg | Freq: Four times a day (QID) | INTRAMUSCULAR | Status: DC | PRN

## 2018-12-29 MED ORDER — METOPROLOL TARTRATE 5 MG/5ML IV SOLN: 5.0000 mg | Freq: Four times a day (QID) | INTRAVENOUS | Status: DC | PRN

## 2018-12-29 MED ORDER — BISACODYL 10 MG RE SUPP
10.0000 mg | Freq: Once | RECTAL | Status: AC
  Administered 2018-12-29: 10 mg via RECTAL
  Filled 2018-12-29: qty 1

## 2018-12-29 MED ORDER — BACITRACIN ZINC 500 UNIT/GM EX OINT
TOPICAL_OINTMENT | Freq: Two times a day (BID) | CUTANEOUS | Status: DC
  Administered 2018-12-29 – 2018-12-30 (×2): via TOPICAL
  Filled 2018-12-29: qty 28.4

## 2018-12-29 MED ORDER — LORAZEPAM 2 MG/ML IJ SOLN: 2.0000 mg | INTRAMUSCULAR | Status: DC | PRN

## 2018-12-29 MED ORDER — HYDROMORPHONE HCL 1 MG/ML IJ SOLN: 0.5000 mg | INTRAMUSCULAR | Status: DC | PRN

## 2018-12-29 MED ORDER — BISACODYL 10 MG RE SUPP: 10.0000 mg | Freq: Every day | RECTAL | Status: DC | PRN

## 2018-12-29 MED ORDER — FOLIC ACID 1 MG PO TABS
1.0000 mg | ORAL_TABLET | Freq: Every day | ORAL | Status: DC
  Administered 2018-12-29 – 2018-12-30 (×2): 1 mg via ORAL
  Filled 2018-12-29 (×2): qty 1
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
## 2018-12-29 NOTE — ED Notes (Signed)
Patient stated that he wants MORPHINE PRN MEDICATION removed from his PRN medication list. Patient stated he has an allergic reaction to this medication. This Probation officer has attempting to contact pharmacy for removal of the medication. The pharmacy has stated I should contact provider. This Probation officer will attempt to contact provider. The provider listed is Md,Trauma, therefore I'm unsure as to who to contact. This Probation officer will attempt to find the appropriate physician to contact.
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
## 2018-12-29 NOTE — ED Notes (Signed)
[facility] was contacted. [facility] is en route for patient transport.

## 2018-12-30 MED ORDER — NICOTINE 14 MG/24HR TD PT24: 14.0000 mg | MEDICATED_PATCH | Freq: Every day | TRANSDERMAL | 0 refills | Status: DC

## 2018-12-30 MED ORDER — BACITRACIN ZINC 500 UNIT/GM EX OINT: TOPICAL_OINTMENT | Freq: Two times a day (BID) | CUTANEOUS | 0 refills | Status: DC

## 2018-12-30 MED ORDER — POLYETHYLENE GLYCOL 3350 17 G PO PACK: 17.0000 g | PACK | Freq: Every day | ORAL | 0 refills | Status: DC

## 2018-12-30 MED ORDER — DOCUSATE SODIUM 100 MG PO CAPS: 100.0000 mg | ORAL_CAPSULE | Freq: Two times a day (BID) | ORAL | 0 refills | Status: DC

## 2018-12-30 MED ORDER — BISACODYL 10 MG RE SUPP: 10.0000 mg | Freq: Every day | RECTAL | 0 refills | Status: DC | PRN

## 2018-12-30 NOTE — Discharge Instructions (Signed)
RIB FRACTURES  HOME INSTRUCTIONS   1. PAIN CONTROL:  1. Pain is best controlled by a usual combination of three different methods TOGETHER:  i. Ice/Heat ii. Over the counter pain medication iii. Prescription pain medication 2. You may experience some swelling and bruising in area of broken ribs. Ice packs or heating pads (30-60 minutes up to 6 times a day) will help. Use ice for the first few days to help decrease swelling and bruising, then switch to heat to help relax tight/sore spots and speed recovery. Some people prefer to use ice alone, heat alone, alternating between ice & heat. Experiment to what works for you. Swelling and bruising can take several weeks to resolve.  3. It is helpful to take an over-the-counter pain medication regularly for the first few weeks. Choose one of the following that works best for you:  i. Naproxen (Aleve, etc) Two 220mg  tabs twice a day ii. Ibuprofen (Advil, etc) Three 200mg  tabs four times a day (every meal & bedtime) iii. Acetaminophen (Tylenol, etc) 500-650mg  four times a day (every meal & bedtime) 4. A prescription for pain medication (such as oxycodone, hydrocodone, etc) may be given to you upon discharge. Take your pain medication as prescribed.  i. If you are having problems/concerns with the prescription medicine (does not control pain, nausea, vomiting, rash, itching, etc), please call us [phone] to see if we need to switch you to a different pain medicine that will work better for you and/or control your side effect better. ii. If you need a refill on your pain medication, please contact your pharmacy. They will contact our office to request authorization. Prescriptions will not be filled after 5 pm or on week-ends. 1. Avoid getting constipated. When taking pain medications, it is common to experience some constipation. Increasing fluid intake and taking a fiber supplement (such as Metamucil, Citrucel, FiberCon, MiraLax, etc) 1-2 times a day  regularly will usually help prevent this problem from occurring. A mild laxative (prune juice, Milk of Magnesia, MiraLax, etc) should be taken according to package directions if there are no bowel movements after 48 hours.  2. Watch out for diarrhea. If you have many loose bowel movements, simplify your diet to bland foods & liquids for a few days. Stop any stool softeners and decrease your fiber supplement. Switching to mild anti-diarrheal medications (Kayopectate, Pepto Bismol) can help. If this worsens or does not improve, please call us. 3. Chest tube site wound: you may remove the dressing from your chest tube site 3 days after the removal of your chest tube. DO NOT shower over the dressing. Once   removed, you may shower as normal. Do not submerge your wound in water for 2-3 weeks.  4. FOLLOW UP  a. Please call our office to set up or confirm an appointment for follow up for 2 weeks after discharge. You will need to get a chest xray at either [facility] or [facility]. This will be outlined in your follow up instructions. Please call [facility] at [phone] if you have any questions about follow up.  b. If you have any orthopedic or other injuries you will need to follow up as outlined in your follow up instructions.   WHEN TO CALL us [phone]:  1. Poor pain control 2. Reactions / problems with new medications (rash/itching, nausea, etc)  3. Fever over 101.5 F (38.5 C) 4. Worsening swelling or bruising 5. Redness, drainage, pain or swelling around chest tube site 6. Worsening  pain, productive cough, difficulty breathing or any other concerning symptoms  The clinic staff is available to answer your questions during regular business hours (8:30am-5pm). Please dont hesitate to call and ask to speak to one of our nurses for clinical concerns.  If you have a medical emergency, go to the nearest emergency room or call 911.  A surgeon from [facility] is always on call  at the [facility], [place], [place], [place], [place] [address] ?  MAIN: [phone] ? TOLL FREE: [phone] ?  FAX [phone]  [email or web address]      Information on Rib Fractures  A rib fracture is a break or crack in one of the bones of the ribs. The ribs are long, curved bones that wrap around your chest and attach to your spine and your breastbone. The ribs protect your heart, lungs, and other organs in the chest. A broken or cracked rib is often painful but is not usually serious. Most rib fractures heal on their own over time. However, rib fractures can be more serious if multiple ribs are broken or if broken ribs move out of place and push against other structures or organs. What are the causes? This condition is caused by:  Repetitive movements with high force, such as pitching a baseball or having severe coughing spells.  A direct blow to the chest, such as a sports injury, a car accident, or a fall.  Cancer that has spread to the bones, which can weaken bones and cause them to break. What are the signs or symptoms? Symptoms of this condition include:  Pain when you breathe in or cough.  Pain when someone presses on the injured area.  Feeling short of breath. How is this diagnosed? This condition is diagnosed with a physical exam and medical history. Imaging tests may also be done, such as:  Chest X-ray.  CT scan.  MRI.  Bone scan.  Chest ultrasound. How is this treated? Treatment for this condition depends on the severity of the fracture. Most rib fractures usually heal on their own in 1-3 months. Sometimes healing takes longer if there is a cough that does not stop or if there are other activities that make the injury worse (aggravating factors). While you heal, you will be given medicines to control the pain. You will also be taught deep breathing exercises. Severe injuries may require  hospitalization or surgery. Follow these instructions at home: Managing pain, stiffness, and swelling  If directed, apply ice to the injured area. ? Put ice in a plastic bag. ? Place a towel between your skin and the bag. ? Leave the ice on for 20 minutes, 2-3 times a day.  Take over-the-counter and prescription medicines only as told by your health care provider. Activity  Avoid a lot of activity and any activities or movements that cause pain. Be careful during activities and avoid bumping the injured rib.  Slowly increase your activity as told by your health care provider. General instructions  Do deep breathing exercises as told by your health care provider. This helps prevent pneumonia, which is a common complication of a broken rib. Your health care provider may instruct you to: ? Take deep breaths several times a day. ? Try to cough several times a day, holding a pillow against the injured area. ? Use a device called incentive spirometer to practice deep breathing several times a day.  Drink enough fluid to keep your  urine pale yellow.  Do not wear a rib belt or binder. These restrict breathing, which can lead to pneumonia.  Keep all follow-up visits as told by your health care provider. This is important. Contact a health care provider if:  You have a fever. Get help right away if:  You have difficulty breathing or you are short of breath.  You develop a cough that does not stop, or you cough up thick or bloody sputum.  You have nausea, vomiting, or pain in your abdomen.  Your pain gets worse and medicine does not help. Summary  A rib fracture is a break or crack in one of the bones of the ribs.  A broken or cracked rib is often painful but is not usually serious.  Most rib fractures heal on their own over time.  Treatment for this condition depends on the severity of the fracture.  Avoid a lot of activity and any activities or movements that cause pain. This  information is not intended to replace advice given to you by your health care provider. Make sure you discuss any questions you have with your health care provider. Document Released: 06/24/2005 Document Revised: 09/23/2016 Document Reviewed: 09/23/2016 Elsevier Interactive Patient Education  2019 [employer].    Constipation, Adult Constipation is when a person:  Poops (has a bowel movement) fewer times in a week than normal.  Has a hard time pooping.  Has poop that is dry, hard, or bigger than normal. Follow these instructions at home: Eating and drinking   Eat foods that have a lot of fiber, such as: ? Fresh fruits and vegetables. ? Whole grains. ? Beans.  Eat less of foods that are high in fat, low in fiber, or overly processed, such as: ? [place] fries. ? Hamburgers. ? Cookies. ? Candy. ? Soda.  Drink enough fluid to keep your pee (urine) clear or pale yellow. General instructions  Exercise regularly or as told by your doctor.  Go to the restroom when you feel like you need to poop. Do not hold it in.  Take over-the-counter and prescription medicines only as told by your doctor. These include any fiber supplements.  Do pelvic floor retraining exercises, such as: ? Doing deep breathing while relaxing your lower belly (abdomen). ? Relaxing your pelvic floor while pooping.  Watch your condition for any changes.  Keep all follow-up visits as told by your doctor. This is important. Contact a doctor if:  You have pain that gets worse.  You have a fever.  You have not pooped for 4 days.  You throw up (vomit).  You are not hungry.  You lose weight.  You are bleeding from the anus.  You have thin, pencil-like poop (stool). Get help right away if:  You have a fever, and your symptoms suddenly get worse.  You leak poop or have blood in your poop.  Your belly feels hard or bigger than normal (is bloated).  You have very bad belly pain.  You feel  dizzy or you faint. This information is not intended to replace advice given to you by your health care provider. Make sure you discuss any questions you have with your health care provider. Document Released: 12/11/2007 Document Revised: 01/12/2016 Document Reviewed: 12/13/2015 Elsevier Interactive Patient Education  2019 [employer].

## 2018-12-30 NOTE — Plan of Care (Signed)
  Problem: Education: Goal: Knowledge of General Education information will improve Description: Including pain rating scale, medication(s)/side effects and non-pharmacologic comfort measures Outcome: Progressing   Problem: Clinical Measurements: Goal: Diagnostic test results will improve Outcome: Progressing   Problem: Clinical Measurements: Goal: Will remain free from infection Outcome: Progressing   Problem: Coping: Goal: Level of anxiety will decrease Outcome: Progressing
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
## 2018-12-30 NOTE — Progress Notes (Signed)
SATURATION QUALIFICATIONS: (This note is used to comply with regulatory documentation for home oxygen)  Patient Saturations on Room Air at Rest = 93%  Patient Saturations on Room Air while Ambulating =84-88 %  Patient Saturations on 2 Liters of oxygen while Ambulating = 91 %  Please briefly explain why patient needs home oxygen: Patient desaturates to between 84%-88% while ambulating or short amounts of exertion.
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
## 2019-11-27 ENCOUNTER — Emergency Department: Payer: 59

## 2019-11-27 ENCOUNTER — Other Ambulatory Visit: Payer: Self-pay

## 2019-11-27 ENCOUNTER — Emergency Department
Admission: EM | Admit: 2019-11-27 | Discharge: 2019-11-27 | Disposition: A | Discharge status: Home / Self Care | Payer: 59 | Attending: Emergency Medicine | Admitting: Emergency Medicine

## 2019-11-27 ENCOUNTER — Encounter: Payer: Self-pay | Admitting: Emergency Medicine

## 2019-11-27 DIAGNOSIS — K5900 Constipation, unspecified: Secondary | ICD-10-CM | POA: Diagnosis not present

## 2019-11-27 DIAGNOSIS — Z79899 Other long term (current) drug therapy: Secondary | ICD-10-CM | POA: Insufficient documentation

## 2019-11-27 DIAGNOSIS — K4020 Bilateral inguinal hernia, without obstruction or gangrene, not specified as recurrent: Secondary | ICD-10-CM | POA: Diagnosis not present

## 2019-11-27 DIAGNOSIS — F17210 Nicotine dependence, cigarettes, uncomplicated: Secondary | ICD-10-CM | POA: Insufficient documentation

## 2019-11-27 DIAGNOSIS — R1030 Lower abdominal pain, unspecified: Secondary | ICD-10-CM | POA: Diagnosis present

## 2019-11-27 DIAGNOSIS — Z7982 Long term (current) use of aspirin: Secondary | ICD-10-CM | POA: Diagnosis not present

## 2019-11-27 DIAGNOSIS — I10 Essential (primary) hypertension: Secondary | ICD-10-CM | POA: Insufficient documentation

## 2019-11-27 HISTORY — DX: Unilateral inguinal hernia, without obstruction or gangrene, not specified as recurrent: K40.90

## 2019-11-27 LAB — COMPREHENSIVE METABOLIC PANEL
ALT: 17 U/L (ref 0–44)
AST: 19 U/L (ref 15–41)
Albumin: 4 g/dL (ref 3.5–5.0)
Alkaline Phosphatase: 59 U/L (ref 38–126)
Anion gap: 7 (ref 5–15)
BUN: 13 mg/dL (ref 8–23)
CO2: 26 mmol/L (ref 22–32)
Calcium: 8.6 mg/dL — ABNORMAL LOW (ref 8.9–10.3)
Chloride: 105 mmol/L (ref 98–111)
Creatinine, Ser: 0.92 mg/dL (ref 0.61–1.24)
GFR calc Af Amer: >60 mL/min (ref >60)
GFR calc non Af Amer: >60 mL/min (ref >60)
Glucose, Bld: 115 mg/dL — ABNORMAL HIGH (ref 70–99)
Potassium: 3.7 mmol/L (ref 3.5–5.1)
Sodium: 138 mmol/L (ref 135–145)
Total Bilirubin: 0.6 mg/dL (ref 0.3–1.2)
Total Protein: 6.6 g/dL (ref 6.5–8.1)

## 2019-11-27 LAB — URINALYSIS, COMPLETE (UACMP) WITH MICROSCOPIC
Bacteria, UA: NONE SEEN
Bilirubin Urine: NEGATIVE
Glucose, UA: NEGATIVE mg/dL
Hgb urine dipstick: NEGATIVE
Ketones, ur: NEGATIVE mg/dL
Leukocytes,Ua: NEGATIVE
Nitrite: NEGATIVE
Protein, ur: NEGATIVE mg/dL
Specific Gravity, Urine: 1.028 (ref 1.005–1.030)
WBC, UA: NONE SEEN WBC/hpf (ref 0–5)
pH: 7 (ref 5.0–8.0)

## 2019-11-27 LAB — CBC
HCT: 37.4 % — ABNORMAL LOW (ref 39.0–52.0)
Hemoglobin: 12.3 g/dL — ABNORMAL LOW (ref 13.0–17.0)
MCH: 29.6 pg (ref 26.0–34.0)
MCHC: 32.9 g/dL (ref 30.0–36.0)
MCV: 90.1 fL (ref 80.0–100.0)
Platelets: 161 10*3/uL (ref 150–400)
RBC: 4.15 MIL/uL — ABNORMAL LOW (ref 4.22–5.81)
RDW: 16 % — ABNORMAL HIGH (ref 11.5–15.5)
WBC: 4.8 10*3/uL (ref 4.0–10.5)
nRBC: 0 % (ref 0.0–0.2)

## 2019-11-27 LAB — LIPASE, BLOOD: Lipase: 20 U/L (ref 11–51)

## 2019-11-27 MED ORDER — METRONIDAZOLE 500 MG PO TABS: 500.0000 mg | ORAL_TABLET | Freq: Three times a day (TID) | ORAL | 0 refills | Status: AC

## 2019-11-27 MED ORDER — IOHEXOL 9 MG/ML PO SOLN
1000.0000 mL | ORAL | Status: AC
  Administered 2019-11-27: 1000 mL via ORAL

## 2019-11-27 MED ORDER — CIPROFLOXACIN HCL 500 MG PO TABS: 500.0000 mg | ORAL_TABLET | Freq: Two times a day (BID) | ORAL | 0 refills | Status: AC

## 2019-11-27 MED ORDER — IOHEXOL 300 MG/ML  SOLN
100.0000 mL | Freq: Once | INTRAMUSCULAR | Status: AC | PRN
  Administered 2019-11-27: 100 mL via INTRAVENOUS

## 2019-11-27 NOTE — ED Notes (Signed)
Pt back from CT, attempting to void for urine sample.

## 2019-11-27 NOTE — ED Provider Notes (Signed)
[facility] Emergency Department Provider Note  Time seen: 9:03 AM  I have reviewed the triage vital signs and the nursing notes.   HISTORY  Chief Complaint Hernia   HPI [name] is a 64 y.o. male with a past medical history of gastric reflux, hypertension, presents to the emergency department with concerns over his hernias.  According to the patient he has bilateral inguinal hernias.  States normally they are reducible but over the past 24 hours they were not reducible.  Patient states he has been constipated over the past day as well.  Patient states he finally was able to reduce them this morning but was concerned so he came to the emergency department.  States they were tender but denies any pain at this time.  Denies any fever.  No nausea or vomiting.   Past Medical History:  Diagnosis Date  . Acid reflux   . Alcohol abuse   . Arthritis    "left pinky" (10/17/2014)  . DDD (degenerative disc disease), lumbar   . Gastroenteritis   . History of stress test 2001   no ischemia  . Hypertension   . Inguinal hernia   . Ribs, multiple fractures 12/2018  . Tobacco abuse   . Vertebral compression fracture (HCC)    T11, L1    Patient Active Problem List   Diagnosis Date Noted  . Multiple rib fractures 12/28/2018  . Rib fractures 12/25/2018  . Acute CVA (cerebrovascular accident) ([place]) 04/08/2018  . TIA (transient ischemic attack) 08/22/2017  . Bilateral recurrent inguinal hernia without obstruction or gangrene   . Influenza 10/18/2014  . Hypoxia 10/16/2014  . SOB (shortness of breath) 10/16/2014  . Cough 10/16/2014  . Fever 10/16/2014  . Sepsis ([place]) 10/16/2014  . Nausea and vomiting 10/16/2014  . Acid reflux   . DDD (degenerative disc disease), lumbar   . Hypertension   . Tobacco abuse   . Alcohol abuse   . Essential hypertension     Past Surgical History:  Procedure Laterality Date  . COLON SURGERY    . EXPLORATORY LAPAROTOMY  W/ BOWEL RESECTION  1980's    Prior to Admission medications   Medication Sig Start Date End Date Taking? Authorizing Provider  acetaminophen (TYLENOL) 325 MG tablet Take 2 tablets (650 mg total) by mouth every 6 (six) hours. 12/28/18   Meuth, Brooke A, [name]  albuterol (PROVENTIL HFA;VENTOLIN HFA) 108 (90 Base) MCG/ACT inhaler Inhale 2 puffs into the lungs every 6 (six) hours as needed for wheezing or shortness of breath. 07/12/17   Johnn Hai, [name]  amLODipine (NORVASC) 5 MG tablet Take 1 tablet (5 mg total) by mouth daily. Patient not taking: Reported on 12/25/2018 04/09/18 07/08/18  Gladstone Lighter, MD  aspirin 81 MG EC tablet Take 1 tablet (81 mg total) by mouth daily. 04/09/18   Gladstone Lighter, MD  atorvastatin (LIPITOR) 10 MG tablet Take 1 tablet (10 mg total) by mouth daily. Patient not taking: Reported on 12/25/2018 04/09/18 07/08/18  Gladstone Lighter, MD  bacitracin ointment Apply topically 2 (two) times daily. 12/30/18   Meuth, Brooke A, [name]  bisacodyl (DULCOLAX) 10 MG suppository Place 1 suppository (10 mg total) rectally daily as needed for moderate constipation. 12/30/18   Meuth, Brooke A, [name]  docusate sodium (COLACE) 100 MG capsule Take 1 capsule (100 mg total) by mouth 2 (two) times daily. 12/30/18   Meuth, Brooke A, [name]  gabapentin (NEURONTIN) 300 MG capsule Take 1 capsule (300 mg total) by  mouth 3 (three) times daily. 12/28/18   Meuth, Blaine Hamper, [name]  methocarbamol (ROBAXIN) 750 MG tablet Take 1 tablet (750 mg total) by mouth every 8 (eight) hours as needed for muscle spasms. 12/28/18   Meuth, Brooke A, [name]  Multiple Vitamin (MULTIVITAMIN WITH MINERALS) TABS tablet Take 1 tablet by mouth daily. 12/29/18   Meuth, Brooke A, [name]  nicotine (NICODERM CQ - DOSED IN MG/24 HOURS) 14 mg/24hr patch Place 1 patch (14 mg total) onto the skin daily. 12/31/18   Meuth, Brooke A, [name]  omeprazole (PRILOSEC) 20 MG capsule Take 20 mg by mouth daily.    [provider]  oxyCODONE 10 MG  TABS Take 0.5-1 tablets (5-10 mg total) by mouth every 6 (six) hours as needed for severe pain. 12/28/18   Meuth, Brooke A, [name]  polyethylene glycol (MIRALAX / GLYCOLAX) 17 g packet Take 17 g by mouth daily. 12/31/18   Meuth, Brooke A, [name]    Allergies  Allergen Reactions  . Penicillins Anaphylaxis    Has patient had a PCN reaction causing immediate rash, facial/tongue/throat swelling, SOB or lightheadedness with hypotension: Yes Has patient had a PCN reaction causing severe rash involving mucus membranes or skin necrosis: No Has patient had a PCN reaction that required hospitalization Yes Has patient had a PCN reaction occurring within the last 10 years: No If all of the above answers are "NO", then may proceed with Cephalosporin use.  Marland Kitchen Morphine And Related Itching    Family History  Problem Relation Age of Onset  . Diabetes Mother   . Hypertension Mother   . Stroke Father   . Heart failure Father   . Heart failure Other     Social History Social History   Tobacco Use  . Smoking status: Current Every Day Smoker    Packs/day: 0.50    Years: 27.00    Pack years: 13.50    Types: Cigarettes  . Smokeless tobacco: Never Used  Substance Use Topics  . Alcohol use: Yes    Alcohol/week: 64.0 standard drinks    Types: 15 Cans of beer, 44 Shots of liquor, 5 Standard drinks or equivalent per week    Comment: 2020 " SOMETIMES DAILY, SOMETIMES I DONT TOUCH ALCOHOL "  . Drug use: No    Types: Marijuana    Comment: 10/17/2014 "last marijuana was in the 1990's"    Review of Systems Constitutional: Negative for fever Cardiovascular: Negative for chest pain. Respiratory: Negative for shortness of breath. Gastrointestinal: Lower abdominal discomfort this morning, now resolved.  Negative for nausea vomiting.  Positive constipation x1 day. Genitourinary: Negative for urinary compaints Musculoskeletal: Negative for musculoskeletal complaints Neurological: Negative for headache All other  ROS negative  ____________________________________________   PHYSICAL EXAM:  VITAL SIGNS: ED Triage Vitals  Enc Vitals Group     BP 11/27/19 0753 (!) 193/108     Pulse Rate 11/27/19 0753 (!) 117     Resp 11/27/19 0753 20     Temp 11/27/19 0753 98 F (36.7 C)     Temp Source 11/27/19 0753 Oral     SpO2 11/27/19 0753 100 %     Weight 11/27/19 0749 221 lb (100.2 kg)     Height 11/27/19 0749 5\' 6"  (1.676 m)     Head Circumference --      Peak Flow --      Pain Score 11/27/19 0749 6     Pain Loc --      Pain Edu? --  Excl. in [place]? --    Constitutional: Alert and oriented. Well appearing and in no distress. Eyes: Normal exam ENT      Head: Normocephalic and atraumatic.      Mouth/Throat: Mucous membranes are moist. Cardiovascular: Normal rate, regular rhythm Respiratory: Normal respiratory effort without tachypnea nor retractions. Breath sounds are clear  Gastrointestinal: Soft and nontender. No distention.   Genitourinary: Overall normal appearing external GU exam without any obvious large hernia. Musculoskeletal: Nontender with normal range of motion in all extremities. Neurologic:  Normal speech and language. No gross focal neurologic deficits  Skin:  Skin is warm, dry and intact.  Psychiatric: Mood and affect are normal.  ____________________________________________    RADIOLOGY  CT scan shows persistent right inguinal hernia with partially containing the dome of the bladder. Sigmoid colon in the left inguinal hernia local inflammation.  ____________________________________________   INITIAL IMPRESSION / ASSESSMENT AND PLAN / ED COURSE  Pertinent labs & imaging results that were available during my care of the patient were reviewed by me and considered in my medical decision making (see chart for details).   Patient presents to the emergency department for lower abdominal pain, distended hernias that have since reduced as well as constipation x1 day.  Currently  on exam patient has benign abdominal exam and no apparent inguinal hernias at this time.  However given the patient's complaint of pain this morning with distended hernias bilaterally and constipation we will obtain CT imaging with oral contrast to rule out obstructive process.  Patient agreeable to plan of care.  I spoke to Dr. Junious Silk and Dr. Dahlia Byes regarding the patient's CT images.  They both believe patient safe for discharge home with outpatient follow-up with surgery.  We will have the patient follow-up with surgery.  He is agreeable to plan of care.  Given the slight amount of local inflammation at this site we will discharge with Augmentin as a precaution.  Reassuringly patient has no fever normal white blood cell count and no tenderness.  Jeffree Lemoine Tivnan was evaluated in Emergency Department on 11/27/2019 for the symptoms described in the history of present illness. He was evaluated in the context of the global COVID-19 pandemic, which necessitated consideration that the patient might be at risk for infection with the SARS-CoV-2 virus that causes COVID-19. Institutional protocols and algorithms that pertain to the evaluation of patients at risk for COVID-19 are in a state of rapid change based on information released by regulatory bodies including the CDC and federal and state organizations. These policies and algorithms were followed during the patient's care in the ED.  ____________________________________________   FINAL CLINICAL IMPRESSION(S) / ED DIAGNOSES  Inguinal hernias   Harvest Dark, MD 11/27/19 1214

## 2019-11-27 NOTE — ED Triage Notes (Signed)
Pt checked in at first nurse desk.  Verbal permission given from patient to ask questions at first nurse desk for triage.    Pt has known inguinal hernia that is causing increased pain.  Pt used to be able to push hernia back in but now not able to. Left side warm to touch per pt.  NAD at this time.

## 2019-11-27 NOTE — Discharge Instructions (Signed)
Please take antibiotics as prescribed.  Please follow-up with general surgery by calling the number provided to arrange an outpatient follow-up.  Return to the emergency department for any return of abdominal pain, development of any fever, or any other symptom personally concerning to yourself.

## 2019-11-27 NOTE — ED Notes (Signed)
Pt took bp meds 30 min before arrival to ED

## 2019-11-27 NOTE — ED Notes (Signed)
CT notified patient completed with oral contrast

## 2020-03-20 ENCOUNTER — Emergency Department: Payer: 59

## 2020-03-20 ENCOUNTER — Emergency Department
Admission: EM | Admit: 2020-03-20 | Discharge: 2020-03-20 | Disposition: A | Discharge status: Home / Self Care | Payer: 59 | Attending: Emergency Medicine | Admitting: Emergency Medicine

## 2020-03-20 ENCOUNTER — Other Ambulatory Visit: Payer: Self-pay

## 2020-03-20 DIAGNOSIS — J40 Bronchitis, not specified as acute or chronic: Secondary | ICD-10-CM

## 2020-03-20 DIAGNOSIS — R0602 Shortness of breath: Secondary | ICD-10-CM | POA: Diagnosis present

## 2020-03-20 DIAGNOSIS — I10 Essential (primary) hypertension: Secondary | ICD-10-CM | POA: Diagnosis not present

## 2020-03-20 DIAGNOSIS — J441 Chronic obstructive pulmonary disease with (acute) exacerbation: Secondary | ICD-10-CM | POA: Diagnosis not present

## 2020-03-20 DIAGNOSIS — Z79899 Other long term (current) drug therapy: Secondary | ICD-10-CM | POA: Diagnosis not present

## 2020-03-20 DIAGNOSIS — R0789 Other chest pain: Secondary | ICD-10-CM | POA: Diagnosis not present

## 2020-03-20 DIAGNOSIS — Z7982 Long term (current) use of aspirin: Secondary | ICD-10-CM | POA: Diagnosis not present

## 2020-03-20 DIAGNOSIS — F17210 Nicotine dependence, cigarettes, uncomplicated: Secondary | ICD-10-CM | POA: Insufficient documentation

## 2020-03-20 DIAGNOSIS — J209 Acute bronchitis, unspecified: Secondary | ICD-10-CM | POA: Insufficient documentation

## 2020-03-20 DIAGNOSIS — Z20822 Contact with and (suspected) exposure to covid-19: Secondary | ICD-10-CM | POA: Diagnosis not present

## 2020-03-20 LAB — FIBRIN DERIVATIVES D-DIMER (ARMC ONLY): Fibrin derivatives D-dimer (ARMC): 210.73 ng/mL (FEU) (ref 0.00–499.00)

## 2020-03-20 LAB — TROPONIN I (HIGH SENSITIVITY)
Troponin I (High Sensitivity): 5 ng/L (ref <18)
Troponin I (High Sensitivity): 5 ng/L (ref <18)

## 2020-03-20 LAB — BASIC METABOLIC PANEL
Anion gap: 9 (ref 5–15)
BUN: 14 mg/dL (ref 8–23)
CO2: 26 mmol/L (ref 22–32)
Calcium: 8.6 mg/dL — ABNORMAL LOW (ref 8.9–10.3)
Chloride: 104 mmol/L (ref 98–111)
Creatinine, Ser: 0.94 mg/dL (ref 0.61–1.24)
GFR calc Af Amer: >60 mL/min (ref >60)
GFR calc non Af Amer: >60 mL/min (ref >60)
Glucose, Bld: 139 mg/dL — ABNORMAL HIGH (ref 70–99)
Potassium: 3.8 mmol/L (ref 3.5–5.1)
Sodium: 139 mmol/L (ref 135–145)

## 2020-03-20 LAB — CBC
HCT: 37.7 % — ABNORMAL LOW (ref 39.0–52.0)
Hemoglobin: 12.8 g/dL — ABNORMAL LOW (ref 13.0–17.0)
MCH: 29.2 pg (ref 26.0–34.0)
MCHC: 34 g/dL (ref 30.0–36.0)
MCV: 86.1 fL (ref 80.0–100.0)
Platelets: 186 10*3/uL (ref 150–400)
RBC: 4.38 MIL/uL (ref 4.22–5.81)
RDW: 16.3 % — ABNORMAL HIGH (ref 11.5–15.5)
WBC: 6 10*3/uL (ref 4.0–10.5)
nRBC: 0.3 % — ABNORMAL HIGH (ref 0.0–0.2)

## 2020-03-20 LAB — SARS CORONAVIRUS 2 BY RT PCR (HOSPITAL ORDER, PERFORMED IN ~~LOC~~ HOSPITAL LAB): SARS Coronavirus 2: NEGATIVE

## 2020-03-20 LAB — BRAIN NATRIURETIC PEPTIDE: B Natriuretic Peptide: 33.7 pg/mL (ref 0.0–100.0)

## 2020-03-20 LAB — PROCALCITONIN: Procalcitonin: <0.1 ng/mL

## 2020-03-20 MED ORDER — METHYLPREDNISOLONE SODIUM SUCC 125 MG IJ SOLR
60.0000 mg | Freq: Once | INTRAMUSCULAR | Status: AC
  Administered 2020-03-20: 60 mg via INTRAMUSCULAR

## 2020-03-20 MED ORDER — ALBUTEROL SULFATE HFA 108 (90 BASE) MCG/ACT IN AERS: 2.0000 | INHALATION_SPRAY | Freq: Four times a day (QID) | RESPIRATORY_TRACT | 1 refills | Status: DC | PRN

## 2020-03-20 MED ORDER — METHYLPREDNISOLONE SODIUM SUCC 125 MG IJ SOLR
60.0000 mg | Freq: Once | INTRAMUSCULAR | Status: DC
  Filled 2020-03-20: qty 2

## 2020-03-20 MED ORDER — PREDNISONE 10 MG PO TABS: 40.0000 mg | ORAL_TABLET | Freq: Every day | ORAL | 0 refills | Status: AC

## 2020-03-20 MED ORDER — IPRATROPIUM-ALBUTEROL 0.5-2.5 (3) MG/3ML IN SOLN
3.0000 mL | Freq: Once | RESPIRATORY_TRACT | Status: AC
  Administered 2020-03-20: 3 mL via RESPIRATORY_TRACT
  Filled 2020-03-20: qty 3

## 2020-03-20 MED ORDER — AMLODIPINE BESYLATE 5 MG PO TABS
5.0000 mg | ORAL_TABLET | Freq: Every day | ORAL | Status: DC
  Administered 2020-03-20: 5 mg via ORAL
  Filled 2020-03-20: qty 1

## 2020-03-20 NOTE — ED Provider Notes (Signed)
[facility] Emergency Department Provider Note  ____________________________________________   First MD Initiated Contact with Patient 03/20/20 1222     (approximate)  I have reviewed the triage vital signs and the nursing notes.   HISTORY  Chief Complaint Shortness of Breath   HPI [name] is a 64 y.o. male with a past medical history of tobacco abuse, HTN, GERD, DDD, multiple left-sided rib fractures from an accident in 2020, TIA, and alcohol abuse who presents for assessment approximately 1 day of shortness of breath associate with some chest tightness on deep breathing.  Patient states he has had on and off chest tightness shortness of breath since his rib fractures in 2020 although he feels today is slightly worse and came on rather suddenly.  He denies any chest pain when he is not taking a deep breath and shortness of breath is not clearly exertional or positional.  He denies any fevers, chills, vomiting, diarrhea, dysuria, abdominal pain, back pain, rash, extremity pain, other acute complaints.  Denies illegal drug use or drinking more than 2 drinks per day last several weeks.  No other acute concerns at this time.  Denies any formal history of COPD diagnosis in the past.         Past Medical History:  Diagnosis Date  . Acid reflux   . Alcohol abuse   . Arthritis    "left pinky" (10/17/2014)  . DDD (degenerative disc disease), lumbar   . Gastroenteritis   . History of stress test 2001   no ischemia  . Hypertension   . Inguinal hernia   . Ribs, multiple fractures 12/2018  . Tobacco abuse   . Vertebral compression fracture (HCC)    T11, L1    Patient Active Problem List   Diagnosis Date Noted  . Multiple rib fractures 12/28/2018  . Rib fractures 12/25/2018  . Acute CVA (cerebrovascular accident) ([place]) 04/08/2018  . TIA (transient ischemic attack) 08/22/2017  . Bilateral recurrent inguinal hernia without obstruction or  gangrene   . Influenza 10/18/2014  . Hypoxia 10/16/2014  . SOB (shortness of breath) 10/16/2014  . Cough 10/16/2014  . Fever 10/16/2014  . Sepsis ([place]) 10/16/2014  . Nausea and vomiting 10/16/2014  . Acid reflux   . DDD (degenerative disc disease), lumbar   . Hypertension   . Tobacco abuse   . Alcohol abuse   . Essential hypertension     Past Surgical History:  Procedure Laterality Date  . COLON SURGERY    . EXPLORATORY LAPAROTOMY W/ BOWEL RESECTION  1980's    Prior to Admission medications   Medication Sig Start Date End Date Taking? Authorizing Provider  acetaminophen (TYLENOL) 325 MG tablet Take 2 tablets (650 mg total) by mouth every 6 (six) hours. 12/28/18   Meuth, Brooke A, [name]  albuterol (VENTOLIN HFA) 108 (90 Base) MCG/ACT inhaler Inhale 2 puffs into the lungs every 6 (six) hours as needed for wheezing or shortness of breath. 03/20/20   Lucrezia Starch, MD  amLODipine (NORVASC) 5 MG tablet Take 1 tablet (5 mg total) by mouth daily. Patient not taking: Reported on 12/25/2018 04/09/18 07/08/18  Gladstone Lighter, MD  aspirin 81 MG EC tablet Take 1 tablet (81 mg total) by mouth daily. 04/09/18   Gladstone Lighter, MD  atorvastatin (LIPITOR) 10 MG tablet Take 1 tablet (10 mg total) by mouth daily. Patient not taking: Reported on 12/25/2018 04/09/18 07/08/18  Gladstone Lighter, MD  bacitracin ointment Apply topically 2 (two) times daily.  12/30/18   Meuth, Brooke A, [name]  bisacodyl (DULCOLAX) 10 MG suppository Place 1 suppository (10 mg total) rectally daily as needed for moderate constipation. 12/30/18   Meuth, Brooke A, [name]  docusate sodium (COLACE) 100 MG capsule Take 1 capsule (100 mg total) by mouth 2 (two) times daily. 12/30/18   Meuth, Brooke A, [name]  gabapentin (NEURONTIN) 300 MG capsule Take 1 capsule (300 mg total) by mouth 3 (three) times daily. 12/28/18   Meuth, Blaine Hamper, [name]  methocarbamol (ROBAXIN) 750 MG tablet Take 1 tablet (750 mg total) by mouth every 8 (eight) hours as  needed for muscle spasms. 12/28/18   Meuth, Brooke A, [name]  Multiple Vitamin (MULTIVITAMIN WITH MINERALS) TABS tablet Take 1 tablet by mouth daily. 12/29/18   Meuth, Brooke A, [name]  nicotine (NICODERM CQ - DOSED IN MG/24 HOURS) 14 mg/24hr patch Place 1 patch (14 mg total) onto the skin daily. 12/31/18   Meuth, Brooke A, [name]  omeprazole (PRILOSEC) 20 MG capsule Take 20 mg by mouth daily.    [provider]  oxyCODONE 10 MG TABS Take 0.5-1 tablets (5-10 mg total) by mouth every 6 (six) hours as needed for severe pain. 12/28/18   Meuth, Brooke A, [name]  polyethylene glycol (MIRALAX / GLYCOLAX) 17 g packet Take 17 g by mouth daily. 12/31/18   Meuth, Brooke A, [name]  predniSONE (DELTASONE) 10 MG tablet Take 4 tablets (40 mg total) by mouth daily for 4 days. 03/20/20 03/24/20  Lucrezia Starch, MD    Allergies Penicillins and Morphine and related  Family History  Problem Relation Age of Onset  . Diabetes Mother   . Hypertension Mother   . Stroke Father   . Heart failure Father   . Heart failure Other     Social History Social History   Tobacco Use  . Smoking status: Current Every Day Smoker    Packs/day: 0.50    Years: 27.00    Pack years: 13.50    Types: Cigarettes  . Smokeless tobacco: Never Used  Vaping Use  . Vaping Use: Never used  Substance Use Topics  . Alcohol use: Yes    Alcohol/week: 64.0 standard drinks    Types: 15 Cans of beer, 44 Shots of liquor, 5 Standard drinks or equivalent per week    Comment: 2020 " SOMETIMES DAILY, SOMETIMES I DONT TOUCH ALCOHOL "  . Drug use: No    Types: Marijuana    Comment: 10/17/2014 "last marijuana was in the 1990's"    Review of Systems  Review of Systems  Constitutional: Negative for chills and fever.  HENT: Negative for sore throat.   Eyes: Negative for pain.  Respiratory: Positive for cough and shortness of breath. Negative for stridor.   Cardiovascular: Positive for chest pain.  Gastrointestinal: Negative for vomiting.    Skin: Negative for rash.  Neurological: Negative for seizures, loss of consciousness and headaches.  Psychiatric/Behavioral: Negative for suicidal ideas.  All other systems reviewed and are negative.     ____________________________________________   PHYSICAL EXAM:  VITAL SIGNS: ED Triage Vitals  Enc Vitals Group     BP 03/20/20 0815 (!) 178/96     Pulse Rate 03/20/20 0815 (!) 108     Resp 03/20/20 0815 (!) 24     Temp 03/20/20 0820 98.7 F (37.1 C)     Temp Source 03/20/20 0815 Oral     SpO2 03/20/20 0815 98 %     Weight 03/20/20 0817 221 lb (100.2 kg)  Height 03/20/20 0817 5\' 11"  (1.803 m)     Head Circumference --      Peak Flow --      Pain Score 03/20/20 0816 2     Pain Loc --      Pain Edu? --      Excl. in [place]? --    Vitals:   03/20/20 1139 03/20/20 1226  BP: (!) 165/111 (!) 176/106  Pulse: 86 91  Resp: 20 (!) 26  Temp: 98.5 F (36.9 C)   SpO2: 99% 99%   Physical Exam Vitals and nursing note reviewed.  Constitutional:      Appearance: He is well-developed.  HENT:     Head: Normocephalic and atraumatic.     Right Ear: External ear normal.     Left Ear: External ear normal.     Nose: Nose normal.  Eyes:     Conjunctiva/sclera: Conjunctivae normal.  Cardiovascular:     Rate and Rhythm: Normal rate and regular rhythm.     Pulses: Normal pulses.     Heart sounds: No murmur heard.   Pulmonary:     Breath sounds: Examination of the right-upper field reveals wheezing. Examination of the left-upper field reveals wheezing. Examination of the right-middle field reveals wheezing. Examination of the left-middle field reveals wheezing. Examination of the right-lower field reveals wheezing. Examination of the left-lower field reveals wheezing. Wheezing present.  Abdominal:     Palpations: Abdomen is soft.     Tenderness: There is no abdominal tenderness.  Musculoskeletal:     Cervical back: Neck supple.  Skin:    General: Skin is warm and dry.      Capillary Refill: Capillary refill takes less than 2 seconds.  Neurological:     Mental Status: He is alert and oriented to person, place, and time.  Psychiatric:        Mood and Affect: Mood normal.      ____________________________________________   LABS (all labs ordered are listed, but only abnormal results are displayed)  Labs Reviewed  CBC - Abnormal; Notable for the following components:      Result Value   Hemoglobin 12.8 (*)    HCT 37.7 (*)    RDW 16.3 (*)    nRBC 0.3 (*)    All other components within normal limits  BASIC METABOLIC PANEL - Abnormal; Notable for the following components:   Glucose, Bld 139 (*)    Calcium 8.6 (*)    All other components within normal limits  SARS CORONAVIRUS 2 BY RT PCR (HOSPITAL ORDER, [facility])  PROCALCITONIN  BRAIN NATRIURETIC PEPTIDE  FIBRIN DERIVATIVES D-DIMER ([facility] ONLY)  TROPONIN I (HIGH SENSITIVITY)  TROPONIN I (HIGH SENSITIVITY)   ____________________________________________  EKG  Sinus tachycardia with a ventricular rate of 107, right bundle branch block, left anterior fascicle block, and no other clear evidence of acute ischemia although some nonspecific ST changes in V4, V5, and V2.  These changes are all very largely similar to prior EKGs. ____________________________________________  RADIOLOGY  ED MD interpretation: No focal consolidation, edema, effusion, or other acute intrathoracic process.  Official radiology report(s): DG Chest 2 View  Result Date: 03/20/2020 CLINICAL DATA:  64 year old male with history of cough and chills. EXAM: CHEST - 2 VIEW COMPARISON:  12/29/2018 FINDINGS: The heart size and mediastinal contours are within normal limits. Both lungs are clear. Similar-appearing multilevel anterior wedge compression deformities of midthoracic vertebral bodies. Osteopenia. Surgical chain staples visualized in the anterior upper abdomen  which is otherwise within normal limits.  IMPRESSION: No active cardiopulmonary disease. Electronically Signed   By: Ruthann Cancer MD   On: 03/20/2020 08:42    ____________________________________________   PROCEDURES  Procedure(s) performed (including Critical Care):  .1-3 Lead EKG Interpretation Performed by: Lucrezia Starch, MD Authorized by: Lucrezia Starch, MD     Interpretation: normal     ECG rate assessment: normal     Rhythm: sinus rhythm     Ectopy: none     Conduction: normal       ____________________________________________   INITIAL IMPRESSION / ASSESSMENT AND PLAN / ED COURSE        Patient presents with [place] to history exam for assessment of shortness of breath associate with some cough and chest tightness.  Patient is slightly tachypneic and hypertensive with otherwise stable vital signs on arrival.  Exam as above.  Overall impression is likely diagnosis of exacerbation of undiagnosed COPD.  Low suspicion for ACS given EKG that is very similar to prior with 2 nonelevated troponins obtained over 2 hours.  Low suspicion for PE as patient's D-dimer is less than 500.  Chest x-ray showed no evidence of consolidative pneumonia, no thorax, edema, or other acute process.  Given no evidence of volume overload on exam or chest x-ray and BNP that is within normal months low suspicion for heart failure.  Low suspicion for acute bacterial infection given absence of fever, elevation white blood cell count, and D-dimer that is less than 0.1.  No significant metabolic derangements identified on above labs.  Patient was treated with steroids and bronchodilator and improvement in his wheezing and shortness of breath on reassessment.  Will write a refill of his albuterol as well as a short course of prednisone.  Instructed patient to use his albuterol every 4-6 hours as needed over the next couple days and schedule follow-up appoint with his PCP.  We will also send a Covid PCR test today that he can follow-up online.  Given  improvement in symptoms and wheezing with otherwise reassuring work-up I do believe patient safe for discharge with plan for outpatient follow-up.  Discharge stable condition.  Strict return precautions/discussed. ____________________________________________   FINAL CLINICAL IMPRESSION(S) / ED DIAGNOSES  Final diagnoses:  COPD exacerbation ([place])  Hypertension, unspecified type  Bronchitis  Person under investigation for COVID-19    Medications  amLODipine (NORVASC) tablet 5 mg (5 mg Oral Given 03/20/20 1241)  methylPREDNISolone sodium succinate (SOLU-MEDROL) 125 mg/2 mL injection 60 mg (has no administration in time range)  ipratropium-albuterol (DUONEB) 0.5-2.5 (3) MG/3ML nebulizer solution 3 mL (has no administration in time range)  ipratropium-albuterol (DUONEB) 0.5-2.5 (3) MG/3ML nebulizer solution 3 mL (3 mLs Nebulization Given 03/20/20 1241)     ED Discharge Orders         Ordered    albuterol (VENTOLIN HFA) 108 (90 Base) MCG/ACT inhaler  Every 6 hours PRN        03/20/20 1353    predniSONE (DELTASONE) 10 MG tablet  Daily        03/20/20 1353           Note:  This document was prepared using Dragon voice recognition software and may include unintentional dictation errors.   Lucrezia Starch, MD 03/20/20 1410

## 2020-03-20 NOTE — ED Triage Notes (Signed)
Pt c/o cough with congestion, fever, chills all weekend, states today his chest feels tight and is having SOB/. Pt is tachypnic on arrival .

## 2020-05-04 IMAGING — DX DG ELBOW COMPLETE 3+V*R*
4 series · 4 of 4 positions shown · non-contrast
Comparison: None.

CLINICAL DATA: Hyperextension injury to right elbow 1 week ago.
Persistent elbow pain and decreased range of motion. Initial
encounter.

EXAM:
RIGHT ELBOW - COMPLETE 3+ VIEW

[elbow ap]
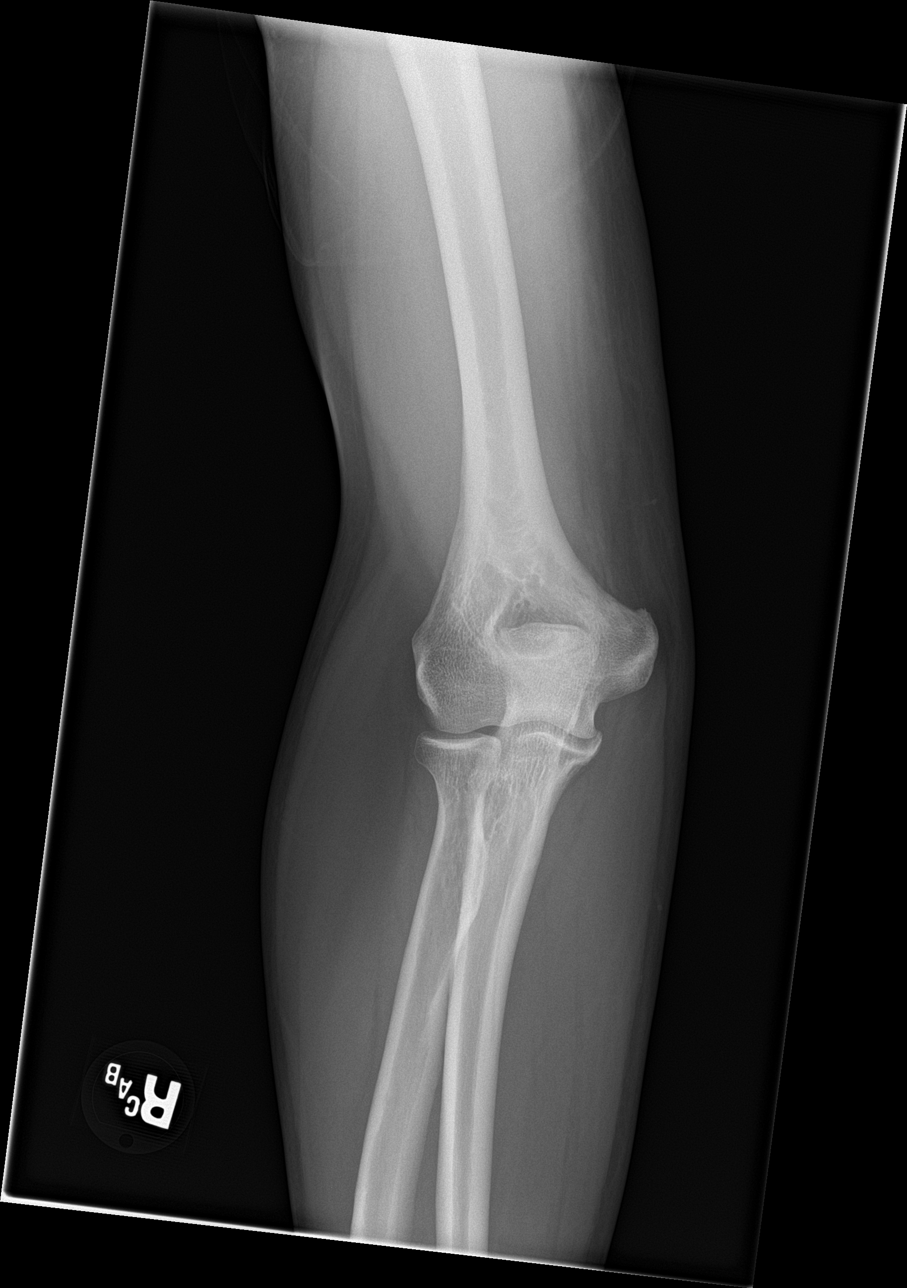

[elbow obl (1 of 2)]
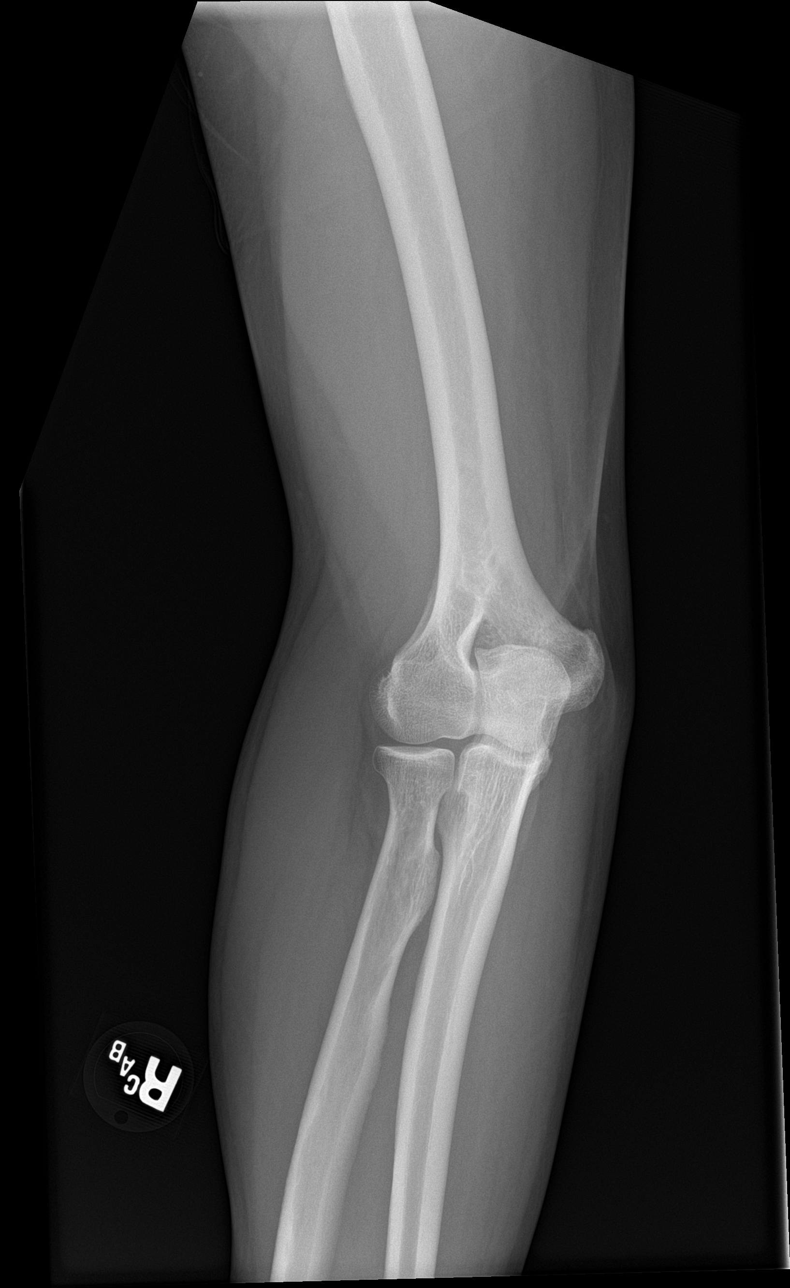

[elbow obl (2 of 2)]
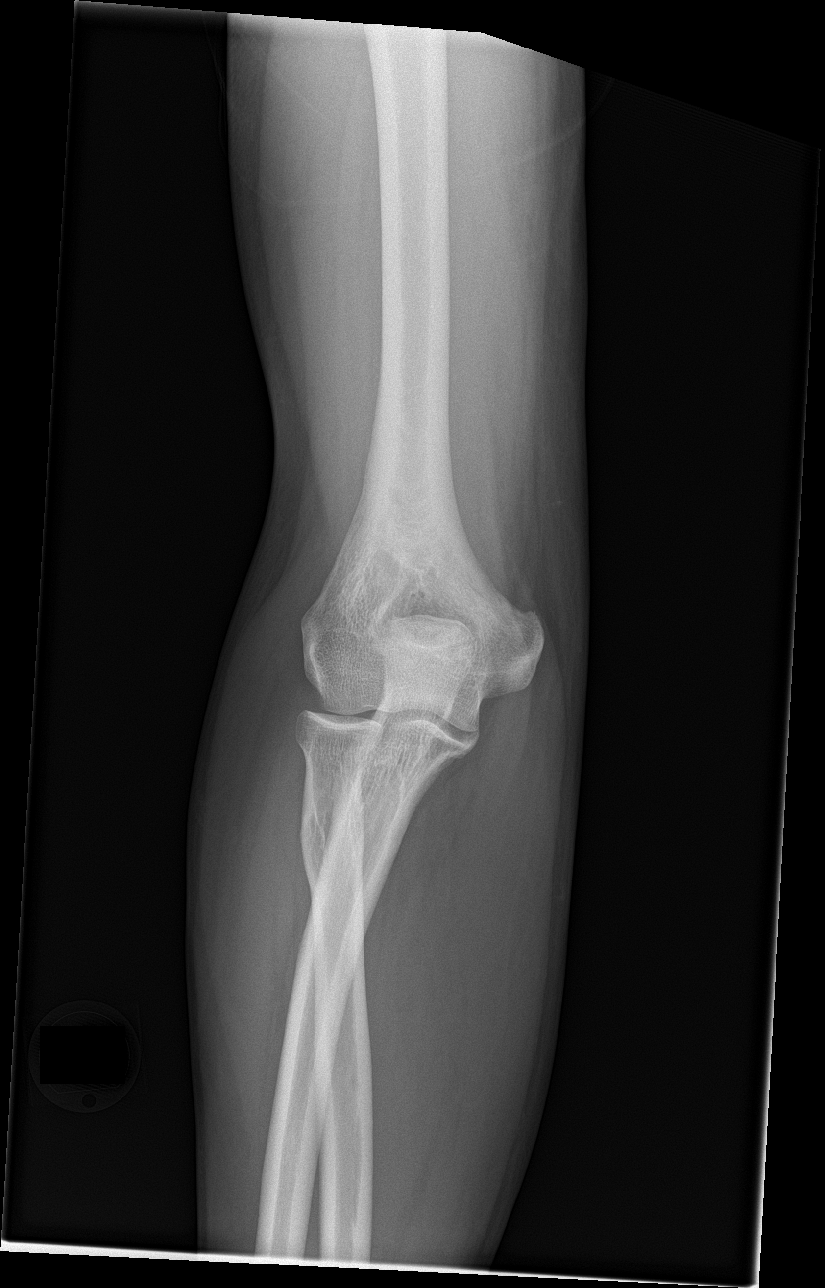

[elbow lat]
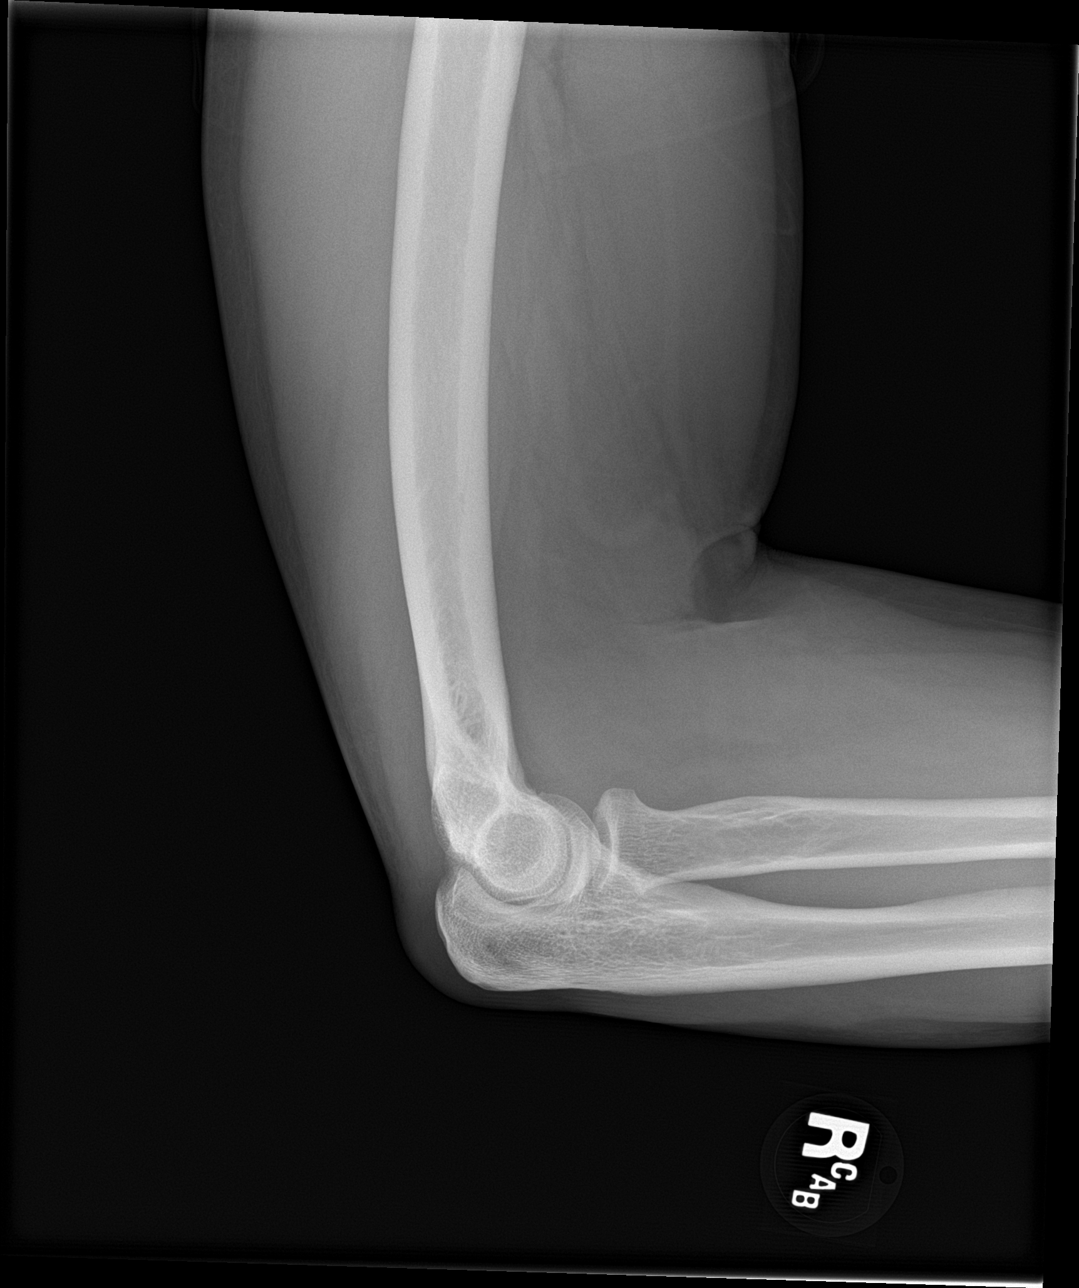

[4 of 4 positions shown; findings below may reference images not displayed]

FINDINGS: There is no evidence of fracture, dislocation, or joint effusion.
There is no evidence of arthropathy or other focal bone abnormality.
Soft tissues are unremarkable.
IMPRESSION: Negative.

## 2020-07-06 ENCOUNTER — Emergency Department: Payer: 59

## 2020-07-06 ENCOUNTER — Other Ambulatory Visit: Payer: Self-pay

## 2020-07-06 ENCOUNTER — Emergency Department
Admission: EM | Admit: 2020-07-06 | Discharge: 2020-07-06 | Disposition: A | Discharge status: Home / Self Care | Payer: 59 | Attending: Emergency Medicine | Admitting: Emergency Medicine

## 2020-07-06 ENCOUNTER — Encounter: Payer: Self-pay | Admitting: Emergency Medicine

## 2020-07-06 DIAGNOSIS — I10 Essential (primary) hypertension: Secondary | ICD-10-CM | POA: Insufficient documentation

## 2020-07-06 DIAGNOSIS — Z20822 Contact with and (suspected) exposure to covid-19: Secondary | ICD-10-CM | POA: Insufficient documentation

## 2020-07-06 DIAGNOSIS — J441 Chronic obstructive pulmonary disease with (acute) exacerbation: Secondary | ICD-10-CM | POA: Diagnosis not present

## 2020-07-06 DIAGNOSIS — Z7982 Long term (current) use of aspirin: Secondary | ICD-10-CM | POA: Diagnosis not present

## 2020-07-06 DIAGNOSIS — Z79899 Other long term (current) drug therapy: Secondary | ICD-10-CM | POA: Diagnosis not present

## 2020-07-06 DIAGNOSIS — F17210 Nicotine dependence, cigarettes, uncomplicated: Secondary | ICD-10-CM | POA: Insufficient documentation

## 2020-07-06 DIAGNOSIS — R0602 Shortness of breath: Secondary | ICD-10-CM | POA: Diagnosis present

## 2020-07-06 LAB — BASIC METABOLIC PANEL
Anion gap: 8 (ref 5–15)
BUN: 11 mg/dL (ref 8–23)
CO2: 25 mmol/L (ref 22–32)
Calcium: 8.8 mg/dL — ABNORMAL LOW (ref 8.9–10.3)
Chloride: 105 mmol/L (ref 98–111)
Creatinine, Ser: 0.79 mg/dL (ref 0.61–1.24)
GFR, Estimated: >60 mL/min (ref >60)
Glucose, Bld: 127 mg/dL — ABNORMAL HIGH (ref 70–99)
Potassium: 3.8 mmol/L (ref 3.5–5.1)
Sodium: 138 mmol/L (ref 135–145)

## 2020-07-06 LAB — CBC
HCT: 39.8 % (ref 39.0–52.0)
Hemoglobin: 13.1 g/dL (ref 13.0–17.0)
MCH: 29.5 pg (ref 26.0–34.0)
MCHC: 32.9 g/dL (ref 30.0–36.0)
MCV: 89.6 fL (ref 80.0–100.0)
Platelets: 209 10*3/uL (ref 150–400)
RBC: 4.44 MIL/uL (ref 4.22–5.81)
RDW: 16.6 % — ABNORMAL HIGH (ref 11.5–15.5)
WBC: 5.2 10*3/uL (ref 4.0–10.5)
nRBC: 0 % (ref 0.0–0.2)

## 2020-07-06 LAB — POC SARS CORONAVIRUS 2 AG -  ED: SARS Coronavirus 2 Ag: NEGATIVE

## 2020-07-06 MED ORDER — PREDNISONE 20 MG PO TABS
60.0000 mg | ORAL_TABLET | Freq: Once | ORAL | Status: AC
  Administered 2020-07-06: 14:00:00 60 mg via ORAL
  Filled 2020-07-06: qty 3

## 2020-07-06 MED ORDER — DOXYCYCLINE HYCLATE 100 MG PO TABS: 100.0000 mg | ORAL_TABLET | Freq: Two times a day (BID) | ORAL | 0 refills | Status: AC

## 2020-07-06 MED ORDER — PREDNISONE 50 MG PO TABS: 50.0000 mg | ORAL_TABLET | Freq: Every day | ORAL | 0 refills | Status: AC

## 2020-07-06 MED ORDER — DOXYCYCLINE HYCLATE 100 MG PO TABS
100.0000 mg | ORAL_TABLET | Freq: Once | ORAL | Status: AC
  Administered 2020-07-06: 14:00:00 100 mg via ORAL
  Filled 2020-07-06: qty 1

## 2020-07-06 MED ORDER — IPRATROPIUM-ALBUTEROL 0.5-2.5 (3) MG/3ML IN SOLN
3.0000 mL | Freq: Once | RESPIRATORY_TRACT | Status: AC
  Administered 2020-07-06: 13:00:00 3 mL via RESPIRATORY_TRACT
  Filled 2020-07-06: qty 3

## 2020-07-06 NOTE — ED Provider Notes (Signed)
[facility] Emergency Department Provider Note ____________________________________________   Event Date/Time   First MD Initiated Contact with Patient 07/06/20 1355     (approximate)  I have reviewed the triage vital signs and the nursing notes.  HISTORY  Chief Complaint Sore Throat, Shortness of Breath, and Cough   HPI [name] is a 64 y.o. malewho presents to the ED for evaluation of sore throat and shortness of breath.  Chart review indicates history of HTN, tobacco abuse and alcohol abuse. Patient denies being provided any recent antibiotics or steroids.  Patient presents to the ED with about 3 days of upper respiratory congestion, clear rhinorrhea, increasing cough and shortness of breath.  Patient reports increased sputum production from his baseline smoker's cough.  Reports subjective chills without documented fevers.  Denies chest pain, but reports a tightness sensation transient improved with his home albuterol inhaler.   Past Medical History:  Diagnosis Date  . Acid reflux   . Alcohol abuse   . Arthritis    "left pinky" (10/17/2014)  . DDD (degenerative disc disease), lumbar   . Gastroenteritis   . History of stress test 2001   no ischemia  . Hypertension   . Inguinal hernia   . Ribs, multiple fractures 12/2018  . Tobacco abuse   . Vertebral compression fracture (HCC)    T11, L1    Patient Active Problem List   Diagnosis Date Noted  . Multiple rib fractures 12/28/2018  . Rib fractures 12/25/2018  . Acute CVA (cerebrovascular accident) ([place]) 04/08/2018  . TIA (transient ischemic attack) 08/22/2017  . Bilateral recurrent inguinal hernia without obstruction or gangrene   . Influenza 10/18/2014  . Hypoxia 10/16/2014  . SOB (shortness of breath) 10/16/2014  . Cough 10/16/2014  . Fever 10/16/2014  . Sepsis ([place]) 10/16/2014  . Nausea and vomiting 10/16/2014  . Acid reflux   . DDD (degenerative disc disease), lumbar    . Hypertension   . Tobacco abuse   . Alcohol abuse   . Essential hypertension     Past Surgical History:  Procedure Laterality Date  . COLON SURGERY    . EXPLORATORY LAPAROTOMY W/ BOWEL RESECTION  1980's    Prior to Admission medications   Medication Sig Start Date End Date Taking? Authorizing Provider  doxycycline (VIBRA-TABS) 100 MG tablet Take 1 tablet (100 mg total) by mouth 2 (two) times daily for 5 days. 07/06/20 07/11/20 Yes Vladimir Crofts, MD  predniSONE (DELTASONE) 50 MG tablet Take 1 tablet (50 mg total) by mouth daily with breakfast for 4 days. 07/07/20 07/11/20 Yes Vladimir Crofts, MD  acetaminophen (TYLENOL) 325 MG tablet Take 2 tablets (650 mg total) by mouth every 6 (six) hours. 12/28/18   Meuth, Brooke A, [name]  albuterol (VENTOLIN HFA) 108 (90 Base) MCG/ACT inhaler Inhale 2 puffs into the lungs every 6 (six) hours as needed for wheezing or shortness of breath. 03/20/20   Lucrezia Starch, MD  amLODipine (NORVASC) 5 MG tablet Take 1 tablet (5 mg total) by mouth daily. Patient not taking: Reported on 12/25/2018 04/09/18 07/08/18  Gladstone Lighter, MD  aspirin 81 MG EC tablet Take 1 tablet (81 mg total) by mouth daily. 04/09/18   Gladstone Lighter, MD  atorvastatin (LIPITOR) 10 MG tablet Take 1 tablet (10 mg total) by mouth daily. Patient not taking: Reported on 12/25/2018 04/09/18 07/08/18  Gladstone Lighter, MD  bacitracin ointment Apply topically 2 (two) times daily. 12/30/18   Meuth, Brooke A, [name]  bisacodyl (DULCOLAX)  10 MG suppository Place 1 suppository (10 mg total) rectally daily as needed for moderate constipation. 12/30/18   Meuth, Brooke A, [name]  docusate sodium (COLACE) 100 MG capsule Take 1 capsule (100 mg total) by mouth 2 (two) times daily. 12/30/18   Meuth, Brooke A, [name]  gabapentin (NEURONTIN) 300 MG capsule Take 1 capsule (300 mg total) by mouth 3 (three) times daily. 12/28/18   Meuth, Lina Sar, [name]  methocarbamol (ROBAXIN) 750 MG tablet Take 1 tablet (750 mg total) by  mouth every 8 (eight) hours as needed for muscle spasms. 12/28/18   Meuth, Brooke A, [name]  Multiple Vitamin (MULTIVITAMIN WITH MINERALS) TABS tablet Take 1 tablet by mouth daily. 12/29/18   Meuth, Brooke A, [name]  nicotine (NICODERM CQ - DOSED IN MG/24 HOURS) 14 mg/24hr patch Place 1 patch (14 mg total) onto the skin daily. 12/31/18   Meuth, Brooke A, [name]  omeprazole (PRILOSEC) 20 MG capsule Take 20 mg by mouth daily.    [provider]  oxyCODONE 10 MG TABS Take 0.5-1 tablets (5-10 mg total) by mouth every 6 (six) hours as needed for severe pain. 12/28/18   Meuth, Brooke A, [name]  polyethylene glycol (MIRALAX / GLYCOLAX) 17 g packet Take 17 g by mouth daily. 12/31/18   Meuth, Brooke A, [name]    Allergies Penicillins and Morphine and related  Family History  Problem Relation Age of Onset  . Diabetes Mother   . Hypertension Mother   . Stroke Father   . Heart failure Father   . Heart failure Other     Social History Social History   Tobacco Use  . Smoking status: Current Every Day Smoker    Packs/day: 0.50    Years: 27.00    Pack years: 13.50    Types: Cigarettes  . Smokeless tobacco: Never Used  Vaping Use  . Vaping Use: Never used  Substance Use Topics  . Alcohol use: Yes    Alcohol/week: 64.0 standard drinks    Types: 15 Cans of beer, 44 Shots of liquor, 5 Standard drinks or equivalent per week    Comment: 2020 " SOMETIMES DAILY, SOMETIMES I DONT TOUCH ALCOHOL "  . Drug use: No    Types: Marijuana    Comment: 10/17/2014 "last marijuana was in the 1990's"    Review of Systems  Constitutional: Positive for subjective fever/chills Eyes: No visual changes. ENT: No sore throat. Cardiovascular: Positive for chest tightness/chest pain. Respiratory: Positive for productive cough and shortness of breath. Gastrointestinal: No abdominal pain.  No nausea, no vomiting.  No diarrhea.  No constipation. Genitourinary: Negative for dysuria. Musculoskeletal: Negative for back  pain. Skin: Negative for rash. Neurological: Negative for headaches, focal weakness or numbness.  ____________________________________________   PHYSICAL EXAM:  VITAL SIGNS: Vitals:   07/06/20 1345 07/06/20 1400  BP:    Pulse: 88 94  Resp:    Temp:    SpO2: 97% 97%     Constitutional: Alert and oriented. Well appearing and in no acute distress.  Sitting up in bed and conversational in full sentences, after DuoNeb. Eyes: Conjunctivae are normal. PERRL. EOMI. Head: Atraumatic. Nose: No congestion/rhinnorhea. Mouth/Throat: Mucous membranes are moist.  Oropharynx non-erythematous. Neck: No stridor. No cervical spine tenderness to palpation. Cardiovascular: Normal rate, regular rhythm. Grossly normal heart sounds.  Good peripheral circulation. Respiratory: Normal respiratory effort.  No retractions. Lungs CTAB.  Clear lungs with good air movement.  This is after he received a DuoNeb in triage Gastrointestinal: Soft , nondistended,  nontender to palpation. No CVA tenderness. Musculoskeletal: No lower extremity tenderness nor edema.  No joint effusions. No signs of acute trauma. Neurologic:  Normal speech and language. No gross focal neurologic deficits are appreciated. No gait instability noted. Skin:  Skin is warm, dry and intact. No rash noted. Psychiatric: Mood and affect are normal. Speech and behavior are normal.  ____________________________________________   LABS (all labs ordered are listed, but only abnormal results are displayed)  Labs Reviewed  BASIC METABOLIC PANEL - Abnormal; Notable for the following components:      Result Value   Glucose, Bld 127 (*)    Calcium 8.8 (*)    All other components within normal limits  CBC - Abnormal; Notable for the following components:   RDW 16.6 (*)    All other components within normal limits  RESP PANEL BY RT-PCR (FLU A&B, COVID) ARPGX2  POC SARS CORONAVIRUS 2 AG -  ED   ____________________________________________  12  Lead EKG  Sinus rhythm, rate of 99 bpm.  Leftward axis.  Right bundle branch block.  No evidence of acute ischemia. At baseline compared to EKG from 03/2020 ____________________________________________  RADIOLOGY  ED MD interpretation: 2 view CXR reviewed by me without evidence of acute cardiopulmonary pathology.  Official radiology report(s): DG Chest 2 View  Result Date: 07/06/2020 CLINICAL DATA:  Cough, shortness of breath. EXAM: CHEST - 2 VIEW COMPARISON:  March 20, 2020. FINDINGS: The heart size and mediastinal contours are within normal limits. Both lungs are clear. No pneumothorax or pleural effusion is noted. The visualized skeletal structures are unremarkable. IMPRESSION: No active cardiopulmonary disease. Electronically Signed   By: Lupita RaiderJames  Green Jr M.D.   On: 07/06/2020 09:31    ____________________________________________   PROCEDURES and INTERVENTIONS  Procedure(s) performed (including Critical Care):  .1-3 Lead EKG Interpretation Performed by: Delton PrairieSmith, Anh Bigos, MD Authorized by: Delton PrairieSmith, Equilla Que, MD     Interpretation: normal     ECG rate:  94   ECG rate assessment: normal     Rhythm: sinus rhythm     Ectopy: none     Conduction: normal      Medications  ipratropium-albuterol (DUONEB) 0.5-2.5 (3) MG/3ML nebulizer solution 3 mL (3 mLs Nebulization Given 07/06/20 1250)  predniSONE (DELTASONE) tablet 60 mg (60 mg Oral Given 07/06/20 1424)  doxycycline (VIBRA-TABS) tablet 100 mg (100 mg Oral Given 07/06/20 1424)    ____________________________________________   MDM / ED COURSE    64 year old male presents to the ED with respiratory congestion and cough, likely viral syndrome precipitating a COPD exacerbation, and amenable to outpatient management.  Normal vitals on room air.  Exam after a DuoNeb reveals no acute pathology.  He reports his breathing is much better, and he has no significant wheezing, distress or evidence of additional pathology.  Considering his  increased sputum production and subjective chills, I think is reasonable to cover for atypical infections with doxycycline.  We will provide a course of steroids and have him follow-up with his PCP.  CXR without evidence of infiltrate or pneumonia.  Blood work unremarkable, and testing negative for COVID-19.  We discussed return precautions for the ED.  Patient medically stable for discharge home.     ____________________________________________   FINAL CLINICAL IMPRESSION(S) / ED DIAGNOSES  Final diagnoses:  COPD exacerbation (HCC)  Shortness of breath     ED Discharge Orders         Ordered    predniSONE (DELTASONE) 50 MG tablet  Daily with breakfast  07/06/20 1403    doxycycline (VIBRA-TABS) 100 MG tablet  2 times daily        07/06/20 1403           [name]   Note:  This document was prepared using Systems analyst and may include unintentional dictation errors.   Vladimir Crofts, MD 07/06/20 [phone]

## 2020-07-06 NOTE — ED Notes (Signed)
Rapid covid swab was NEGATIVE.

## 2020-07-06 NOTE — Discharge Instructions (Signed)
He was seen in the ED because of your difficulty breathing, sore throat and coughing.  Your x-ray does not show pneumonia, you tested negative for COVID-19, and your blood work was fine.  You are being discharged with 2 medications to treat his COPD exacerbation: Prednisone steroids to take once daily for the next 5 days.  We gave you your dose for today, so your prescription will have 4 pills starting tomorrow. Doxycycline antibiotic to take 2 times daily for the next 5 days.  We gave you the first pill here, see prescription as 9 pills starting this evening.  Continue to use your inhaler at home, 2 puffs every 4 hours or so as you are getting over this.  Return to the ED with any worsening symptoms despite these medications.  Use Tylenol for pain and fevers.  Up to 1000 mg per dose, up to 4 times per day.  Do not take more than 4000 mg of Tylenol/acetaminophen within 24 hours.Marland Kitchen

## 2020-07-06 NOTE — ED Triage Notes (Signed)
Pt comes into the ED via POV c/o cough, SHOB, sore throat, runny nose x 3 days.  Pt denies being around anyone that has COVID.  Pt denies any COPD or asthma.  Pt currently has even and unlabored respirations.  Pt denies any CP or nausea.

## 2020-11-24 ENCOUNTER — Other Ambulatory Visit: Payer: Self-pay

## 2020-11-24 ENCOUNTER — Encounter: Payer: Self-pay | Admitting: Emergency Medicine

## 2020-11-24 ENCOUNTER — Emergency Department: Payer: BC Managed Care – PPO

## 2020-11-24 ENCOUNTER — Emergency Department
Admission: EM | Admit: 2020-11-24 | Discharge: 2020-11-24 | Disposition: A | Discharge status: Home / Self Care | Payer: BC Managed Care – PPO | Attending: Emergency Medicine | Admitting: Emergency Medicine

## 2020-11-24 DIAGNOSIS — F17210 Nicotine dependence, cigarettes, uncomplicated: Secondary | ICD-10-CM | POA: Diagnosis not present

## 2020-11-24 DIAGNOSIS — Z79899 Other long term (current) drug therapy: Secondary | ICD-10-CM | POA: Diagnosis not present

## 2020-11-24 DIAGNOSIS — Z7982 Long term (current) use of aspirin: Secondary | ICD-10-CM | POA: Diagnosis not present

## 2020-11-24 DIAGNOSIS — R202 Paresthesia of skin: Secondary | ICD-10-CM | POA: Diagnosis not present

## 2020-11-24 DIAGNOSIS — I10 Essential (primary) hypertension: Secondary | ICD-10-CM | POA: Diagnosis not present

## 2020-11-24 LAB — DIFFERENTIAL
Abs Immature Granulocytes: 0.03 10*3/uL (ref 0.00–0.07)
Basophils Absolute: 0 10*3/uL (ref 0.0–0.1)
Basophils Relative: 0 %
Eosinophils Absolute: 0.2 10*3/uL (ref 0.0–0.5)
Eosinophils Relative: 4 %
Immature Granulocytes: 1 %
Lymphocytes Relative: 33 %
Lymphs Abs: 1.7 10*3/uL (ref 0.7–4.0)
Monocytes Absolute: 0.6 10*3/uL (ref 0.1–1.0)
Monocytes Relative: 11 %
Neutro Abs: 2.7 10*3/uL (ref 1.7–7.7)
Neutrophils Relative %: 51 %

## 2020-11-24 LAB — MAGNESIUM: Magnesium: 1.8 mg/dL (ref 1.7–2.4)

## 2020-11-24 LAB — CBC
HCT: 37.1 % — ABNORMAL LOW (ref 39.0–52.0)
Hemoglobin: 12.6 g/dL — ABNORMAL LOW (ref 13.0–17.0)
MCH: 30.2 pg (ref 26.0–34.0)
MCHC: 34 g/dL (ref 30.0–36.0)
MCV: 89 fL (ref 80.0–100.0)
Platelets: 204 10*3/uL (ref 150–400)
RBC: 4.17 MIL/uL — ABNORMAL LOW (ref 4.22–5.81)
RDW: 16.5 % — ABNORMAL HIGH (ref 11.5–15.5)
WBC: 5.2 10*3/uL (ref 4.0–10.5)
nRBC: 0 % (ref 0.0–0.2)

## 2020-11-24 LAB — CBG MONITORING, ED: Glucose-Capillary: 102 mg/dL — ABNORMAL HIGH (ref 70–99)

## 2020-11-24 LAB — COMPREHENSIVE METABOLIC PANEL
ALT: 16 U/L (ref 0–44)
AST: 17 U/L (ref 15–41)
Albumin: 4.5 g/dL (ref 3.5–5.0)
Alkaline Phosphatase: 51 U/L (ref 38–126)
Anion gap: 10 (ref 5–15)
BUN: 9 mg/dL (ref 8–23)
CO2: 21 mmol/L — ABNORMAL LOW (ref 22–32)
Calcium: 8.8 mg/dL — ABNORMAL LOW (ref 8.9–10.3)
Chloride: 105 mmol/L (ref 98–111)
Creatinine, Ser: 0.78 mg/dL (ref 0.61–1.24)
GFR, Estimated: >60 mL/min (ref >60)
Glucose, Bld: 100 mg/dL — ABNORMAL HIGH (ref 70–99)
Potassium: 3.3 mmol/L — ABNORMAL LOW (ref 3.5–5.1)
Sodium: 136 mmol/L (ref 135–145)
Total Bilirubin: 0.9 mg/dL (ref 0.3–1.2)
Total Protein: 7.2 g/dL (ref 6.5–8.1)

## 2020-11-24 LAB — APTT: aPTT: 28 seconds (ref 24–36)

## 2020-11-24 LAB — PROTIME-INR
INR: 1 (ref 0.8–1.2)
Prothrombin Time: 12.9 seconds (ref 11.4–15.2)

## 2020-11-24 LAB — TSH: TSH: 1.969 u[IU]/mL (ref 0.350–4.500)

## 2020-11-24 LAB — T4, FREE: Free T4: 0.78 ng/dL (ref 0.61–1.12)

## 2020-11-24 MED ORDER — AMLODIPINE BESYLATE 5 MG PO TABS: 5.0000 mg | ORAL_TABLET | Freq: Every day | ORAL | 1 refills | Status: DC

## 2020-11-24 MED ORDER — INDOMETHACIN 50 MG PO CAPS: 50.0000 mg | ORAL_CAPSULE | Freq: Three times a day (TID) | ORAL | 0 refills | Status: DC

## 2020-11-24 NOTE — ED Provider Notes (Signed)
[facility] Emergency Department Provider Note   ____________________________________________   I have reviewed the triage vital signs and the nursing notes.   HISTORY  Chief Complaint Numbness   History limited by: Not Limited   HPI [name] is a 65 y.o. male who presents to the emergency department today because of concerns for tingling and numbness in his extremities as well as some left foot swelling.  Patient states that the symptoms started earlier today.  He states that the tingling and numbness initially was in his left foot although then bounced between his 2 feet.  He also had an episode where it was in bilateral upper extremities although that has improved.  As the day progressed he noticed some swelling and redness to his left foot.  Denies any trauma.  Denies any pain. Denies any symptoms like this in the past. States that he has not seen a doctor in years.   Records reviewed. Per medical record review patient has a history of CVA  Past Medical History:  Diagnosis Date  . Acid reflux   . Alcohol abuse   . Arthritis    "left pinky" (10/17/2014)  . DDD (degenerative disc disease), lumbar   . Gastroenteritis   . History of stress test 2001   no ischemia  . Hypertension   . Inguinal hernia   . Ribs, multiple fractures 12/2018  . Tobacco abuse   . Vertebral compression fracture (HCC)    T11, L1    Patient Active Problem List   Diagnosis Date Noted  . Multiple rib fractures 12/28/2018  . Rib fractures 12/25/2018  . Acute CVA (cerebrovascular accident) ([place]) 04/08/2018  . TIA (transient ischemic attack) 08/22/2017  . Bilateral recurrent inguinal hernia without obstruction or gangrene   . Influenza 10/18/2014  . Hypoxia 10/16/2014  . SOB (shortness of breath) 10/16/2014  . Cough 10/16/2014  . Fever 10/16/2014  . Sepsis ([place]) 10/16/2014  . Nausea and vomiting 10/16/2014  . Acid reflux   . DDD (degenerative disc disease),  lumbar   . Hypertension   . Tobacco abuse   . Alcohol abuse   . Essential hypertension     Past Surgical History:  Procedure Laterality Date  . COLON SURGERY    . EXPLORATORY LAPAROTOMY W/ BOWEL RESECTION  1980's    Prior to Admission medications   Medication Sig Start Date End Date Taking? Authorizing Provider  acetaminophen (TYLENOL) 325 MG tablet Take 2 tablets (650 mg total) by mouth every 6 (six) hours. 12/28/18   Meuth, Brooke A, [name]  albuterol (VENTOLIN HFA) 108 (90 Base) MCG/ACT inhaler Inhale 2 puffs into the lungs every 6 (six) hours as needed for wheezing or shortness of breath. 03/20/20   Lucrezia Starch, MD  amLODipine (NORVASC) 5 MG tablet Take 1 tablet (5 mg total) by mouth daily. Patient not taking: Reported on 12/25/2018 04/09/18 07/08/18  Gladstone Lighter, MD  aspirin 81 MG EC tablet Take 1 tablet (81 mg total) by mouth daily. 04/09/18   Gladstone Lighter, MD  atorvastatin (LIPITOR) 10 MG tablet Take 1 tablet (10 mg total) by mouth daily. Patient not taking: Reported on 12/25/2018 04/09/18 07/08/18  Gladstone Lighter, MD  bacitracin ointment Apply topically 2 (two) times daily. 12/30/18   Meuth, Brooke A, [name]  bisacodyl (DULCOLAX) 10 MG suppository Place 1 suppository (10 mg total) rectally daily as needed for moderate constipation. 12/30/18   Meuth, Brooke A, [name]  docusate sodium (COLACE) 100 MG capsule Take 1 capsule (  100 mg total) by mouth 2 (two) times daily. 12/30/18   Meuth, Brooke A, [name]  gabapentin (NEURONTIN) 300 MG capsule Take 1 capsule (300 mg total) by mouth 3 (three) times daily. 12/28/18   Meuth, Blaine Hamper, [name]  methocarbamol (ROBAXIN) 750 MG tablet Take 1 tablet (750 mg total) by mouth every 8 (eight) hours as needed for muscle spasms. 12/28/18   Meuth, Brooke A, [name]  Multiple Vitamin (MULTIVITAMIN WITH MINERALS) TABS tablet Take 1 tablet by mouth daily. 12/29/18   Meuth, Brooke A, [name]  nicotine (NICODERM CQ - DOSED IN MG/24 HOURS) 14 mg/24hr patch Place 1  patch (14 mg total) onto the skin daily. 12/31/18   Meuth, Brooke A, [name]  omeprazole (PRILOSEC) 20 MG capsule Take 20 mg by mouth daily.    [provider]  oxyCODONE 10 MG TABS Take 0.5-1 tablets (5-10 mg total) by mouth every 6 (six) hours as needed for severe pain. 12/28/18   Meuth, Brooke A, [name]  polyethylene glycol (MIRALAX / GLYCOLAX) 17 g packet Take 17 g by mouth daily. 12/31/18   Meuth, Brooke A, [name]    Allergies Penicillins and Morphine and related  Family History  Problem Relation Age of Onset  . Diabetes Mother   . Hypertension Mother   . Stroke Father   . Heart failure Father   . Heart failure Other     Social History Social History   Tobacco Use  . Smoking status: Current Every Day Smoker    Packs/day: 0.50    Years: 27.00    Pack years: 13.50    Types: Cigarettes  . Smokeless tobacco: Never Used  Vaping Use  . Vaping Use: Never used  Substance Use Topics  . Alcohol use: Yes    Alcohol/week: 64.0 standard drinks    Types: 15 Cans of beer, 44 Shots of liquor, 5 Standard drinks or equivalent per week    Comment: 2020 " SOMETIMES DAILY, SOMETIMES I DONT TOUCH ALCOHOL "  . Drug use: No    Types: Marijuana    Comment: 10/17/2014 "last marijuana was in the 1990's"    Review of Systems Constitutional: No fever/chills Eyes: No visual changes. ENT: No sore throat. Cardiovascular: Denies chest pain. Respiratory: Denies shortness of breath. Gastrointestinal: No abdominal pain.  No nausea, no vomiting.  No diarrhea.   Genitourinary: Negative for dysuria. Musculoskeletal: Positive for left foot swelling and redness. Skin: Negative for rash. Neurological: Positive for numbness and tingling in extremities.  ____________________________________________   PHYSICAL EXAM:  VITAL SIGNS: ED Triage Vitals  Enc Vitals Group     BP 11/24/20 1725 (!) 164/107     Pulse Rate 11/24/20 1725 (!) 117     Resp 11/24/20 1725 18     Temp 11/24/20 1725 98.6 F (37 C)      Temp Source 11/24/20 1725 Oral     SpO2 11/24/20 1725 97 %     Weight 11/24/20 1726 224 lb (101.6 kg)     Height 11/24/20 1726 5\' 11"  (1.803 m)     Head Circumference --      Peak Flow --      Pain Score 11/24/20 1726 0   Constitutional: Alert and oriented.  Eyes: Conjunctivae are normal.  ENT      Head: Normocephalic and atraumatic.      Nose: No congestion/rhinnorhea.      Mouth/Throat: Mucous membranes are moist.      Neck: No stridor. Hematological/Lymphatic/Immunilogical: No cervical lymphadenopathy. Cardiovascular: Normal rate,  regular rhythm.  No murmurs, rubs, or gallops.  Respiratory: Normal respiratory effort without tachypnea nor retractions. Breath sounds are clear and equal bilaterally. No wheezes/rales/rhonchi. Gastrointestinal: Soft and non tender. No rebound. No guarding.  Genitourinary: Deferred Musculoskeletal: Left foot with some swelling and erythema to the base of the 2nd and 3rd digit. No tenderness. DP 2+. Neurologic:  Normal speech and language. No gross focal neurologic deficits are appreciated.  Skin:  Skin is warm, dry and intact. No rash noted. Psychiatric: Mood and affect are normal. Speech and behavior are normal. Patient exhibits appropriate insight and judgment.  ____________________________________________    LABS (pertinent positives/negatives)  CMP na 136, k 3.3, glu 100, cr 0.78 CBC wbc 5.2, hgb 12.6, plt 204  ____________________________________________   EKG  I, Nance Pear, attending physician, personally viewed and interpreted this EKG  EKG Time: 1735 Rate: 106 Rhythm: sinus tachycardia Axis: left axis deviation Intervals: qtc 491 QRS: RBBB ST changes: no st elevation Impression: abnormal ekg   ____________________________________________    RADIOLOGY  CT head Mild atrophic change no acute  abnormality  ____________________________________________   PROCEDURES  Procedures  ____________________________________________   INITIAL IMPRESSION / ASSESSMENT AND PLAN / ED COURSE  Pertinent labs & imaging results that were available during my care of the patient were reviewed by me and considered in my medical decision making (see chart for details).   Patient presented to the emergency department today because of concerns for some paresthesias as well as some swelling and redness to his left foot.  On exam patient does have some erythema and swelling to the base of the second and third digits on his left foot.  This there is nontender and no significant warmth.  I have low suspicion for cellulitis.  Additionally I have low suspicion for gout although patient does state that he is a heavy alcohol user.  Because of this I think treatment for gout is reasonable.  Terms of the paresthesias unclear etiology at this time.  Patient denies any neck or back pain.  I do not think it would reference that acute cervical stroke.  Will plan on treating for gout.  Additionally will give patient prescription for antihypertensives. Discussed return precautions and importance of primary care follow up.  ____________________________________________   FINAL CLINICAL IMPRESSION(S) / ED DIAGNOSES  Final diagnoses:  Paresthesia  Hypertension, unspecified type     Note: This dictation was prepared with Dragon dictation. Any transcriptional errors that result from this process are unintentional     Nance Pear, MD 11/24/20 2246

## 2020-11-24 NOTE — Discharge Instructions (Addendum)
Please seek medical attention for any high fevers, chest pain, shortness of breath, change in behavior, persistent vomiting, bloody stool or any other new or concerning symptoms.  

## 2020-11-24 NOTE — ED Triage Notes (Signed)
Pt comes into the ED via POV c/o numbness to the left foot that started at 8:30 this morning.  Pt also presents with swelling to the top of his foot.  Pt denies any known medical history but states he hasnt seen a physician in many years.  Pt also describes a "pins and needles" sensation in bilateral hands.  Pt has good color to bilateral feet at this time and is ambulatory to triage.

## 2021-04-06 ENCOUNTER — Other Ambulatory Visit: Payer: Self-pay

## 2021-04-06 ENCOUNTER — Emergency Department
Admission: EM | Admit: 2021-04-06 | Discharge: 2021-04-06 | Disposition: A | Discharge status: Home / Self Care | Payer: 59 | Attending: Emergency Medicine | Admitting: Emergency Medicine

## 2021-04-06 ENCOUNTER — Emergency Department: Payer: 59

## 2021-04-06 DIAGNOSIS — Z79899 Other long term (current) drug therapy: Secondary | ICD-10-CM | POA: Insufficient documentation

## 2021-04-06 DIAGNOSIS — I10 Essential (primary) hypertension: Secondary | ICD-10-CM | POA: Diagnosis not present

## 2021-04-06 DIAGNOSIS — Z7982 Long term (current) use of aspirin: Secondary | ICD-10-CM | POA: Diagnosis not present

## 2021-04-06 DIAGNOSIS — F17210 Nicotine dependence, cigarettes, uncomplicated: Secondary | ICD-10-CM | POA: Diagnosis not present

## 2021-04-06 DIAGNOSIS — K4021 Bilateral inguinal hernia, without obstruction or gangrene, recurrent: Secondary | ICD-10-CM

## 2021-04-06 DIAGNOSIS — R1030 Lower abdominal pain, unspecified: Secondary | ICD-10-CM | POA: Diagnosis present

## 2021-04-06 DIAGNOSIS — N5089 Other specified disorders of the male genital organs: Secondary | ICD-10-CM

## 2021-04-06 DIAGNOSIS — N5082 Scrotal pain: Secondary | ICD-10-CM

## 2021-04-06 LAB — COMPREHENSIVE METABOLIC PANEL
ALT: 14 U/L (ref 0–44)
AST: 18 U/L (ref 15–41)
Albumin: 4.4 g/dL (ref 3.5–5.0)
Alkaline Phosphatase: 51 U/L (ref 38–126)
Anion gap: 10 (ref 5–15)
BUN: 14 mg/dL (ref 8–23)
CO2: 26 mmol/L (ref 22–32)
Calcium: 8.9 mg/dL (ref 8.9–10.3)
Chloride: 102 mmol/L (ref 98–111)
Creatinine, Ser: 0.93 mg/dL (ref 0.61–1.24)
GFR, Estimated: >60 mL/min (ref >60)
Glucose, Bld: 120 mg/dL — ABNORMAL HIGH (ref 70–99)
Potassium: 3.6 mmol/L (ref 3.5–5.1)
Sodium: 138 mmol/L (ref 135–145)
Total Bilirubin: 0.8 mg/dL (ref 0.3–1.2)
Total Protein: 7.5 g/dL (ref 6.5–8.1)

## 2021-04-06 LAB — URINALYSIS, COMPLETE (UACMP) WITH MICROSCOPIC
Bilirubin Urine: NEGATIVE
Glucose, UA: NEGATIVE mg/dL
Ketones, ur: NEGATIVE mg/dL
Leukocytes,Ua: NEGATIVE
Nitrite: NEGATIVE
Protein, ur: NEGATIVE mg/dL
Specific Gravity, Urine: 1.025 (ref 1.005–1.030)
WBC, UA: NONE SEEN WBC/hpf (ref 0–5)
pH: 6 (ref 5.0–8.0)

## 2021-04-06 LAB — CBC
HCT: 39.4 % (ref 39.0–52.0)
Hemoglobin: 13.3 g/dL (ref 13.0–17.0)
MCH: 30.4 pg (ref 26.0–34.0)
MCHC: 33.8 g/dL (ref 30.0–36.0)
MCV: 90 fL (ref 80.0–100.0)
Platelets: 256 10*3/uL (ref 150–400)
RBC: 4.38 MIL/uL (ref 4.22–5.81)
RDW: 18.8 % — ABNORMAL HIGH (ref 11.5–15.5)
WBC: 7.4 10*3/uL (ref 4.0–10.5)
nRBC: 0 % (ref 0.0–0.2)

## 2021-04-06 LAB — LIPASE, BLOOD: Lipase: 29 U/L (ref 11–51)

## 2021-04-06 NOTE — ED Provider Notes (Signed)
[facility] Emergency Department Provider Note  ____________________________________________  Time seen: Approximately 11:55 AM  I have reviewed the triage vital signs and the nursing notes.   HISTORY  Chief Complaint Inguinal Hernia    HPI [name] is a 65 y.o. male with past history of bilateral inguinal hernia who comes ED complaining of worsening left inguinal pain.  No vomiting or constipation, eating and drinking normally.  Pain is worse with standing, better sitting down.  Thinks it may be worse due to his change in job, now works on the Geographical information systems officer at [employer] and is standing for many hours a day. He also has chronic right inguinal hernia which does not feel any different.   Past Medical History:  Diagnosis Date   Acid reflux    Alcohol abuse    Arthritis    "left pinky" (10/17/2014)   DDD (degenerative disc disease), lumbar    Gastroenteritis    History of stress test 2001   no ischemia   Hypertension    Inguinal hernia    Ribs, multiple fractures 12/2018   Tobacco abuse    Vertebral compression fracture (HCC)    T11, L1     Patient Active Problem List   Diagnosis Date Noted   Multiple rib fractures 12/28/2018   Rib fractures 12/25/2018   Acute CVA (cerebrovascular accident) ([place]) 04/08/2018   TIA (transient ischemic attack) 08/22/2017   Bilateral recurrent inguinal hernia without obstruction or gangrene    Influenza 10/18/2014   Hypoxia 10/16/2014   SOB (shortness of breath) 10/16/2014   Cough 10/16/2014   Fever 10/16/2014   Sepsis ([place]) 10/16/2014   Nausea and vomiting 10/16/2014   Acid reflux    DDD (degenerative disc disease), lumbar    Hypertension    Tobacco abuse    Alcohol abuse    Essential hypertension      Past Surgical History:  Procedure Laterality Date   COLON SURGERY     EXPLORATORY LAPAROTOMY W/ BOWEL RESECTION  1980's     Prior to Admission medications   Medication Sig Start Date End  Date Taking? Authorizing Provider  acetaminophen (TYLENOL) 325 MG tablet Take 2 tablets (650 mg total) by mouth every 6 (six) hours. 12/28/18   Meuth, Brooke A, [name]  albuterol (VENTOLIN HFA) 108 (90 Base) MCG/ACT inhaler Inhale 2 puffs into the lungs every 6 (six) hours as needed for wheezing or shortness of breath. 03/20/20   Lucrezia Starch, MD  amLODipine (NORVASC) 5 MG tablet Take 1 tablet (5 mg total) by mouth daily. Patient not taking: Reported on 12/25/2018 04/09/18 07/08/18  Gladstone Lighter, MD  amLODipine (NORVASC) 5 MG tablet Take 1 tablet (5 mg total) by mouth daily. 11/24/20 11/24/21  Nance Pear, MD  aspirin 81 MG EC tablet Take 1 tablet (81 mg total) by mouth daily. 04/09/18   Gladstone Lighter, MD  atorvastatin (LIPITOR) 10 MG tablet Take 1 tablet (10 mg total) by mouth daily. Patient not taking: Reported on 12/25/2018 04/09/18 07/08/18  Gladstone Lighter, MD  bacitracin ointment Apply topically 2 (two) times daily. 12/30/18   Meuth, Brooke A, [name]  bisacodyl (DULCOLAX) 10 MG suppository Place 1 suppository (10 mg total) rectally daily as needed for moderate constipation. 12/30/18   Meuth, Brooke A, [name]  docusate sodium (COLACE) 100 MG capsule Take 1 capsule (100 mg total) by mouth 2 (two) times daily. 12/30/18   Meuth, Brooke A, [name]  gabapentin (NEURONTIN) 300 MG capsule Take 1 capsule (300 mg  total) by mouth 3 (three) times daily. 12/28/18   Meuth, Brooke A, [name]  indomethacin (INDOCIN) 50 MG capsule Take 1 capsule (50 mg total) by mouth 3 (three) times daily with meals. 11/24/20   Nance Pear, MD  methocarbamol (ROBAXIN) 750 MG tablet Take 1 tablet (750 mg total) by mouth every 8 (eight) hours as needed for muscle spasms. 12/28/18   Meuth, Brooke A, [name]  Multiple Vitamin (MULTIVITAMIN WITH MINERALS) TABS tablet Take 1 tablet by mouth daily. 12/29/18   Meuth, Brooke A, [name]  nicotine (NICODERM CQ - DOSED IN MG/24 HOURS) 14 mg/24hr patch Place 1 patch (14 mg total) onto the skin  daily. 12/31/18   Meuth, Brooke A, [name]  omeprazole (PRILOSEC) 20 MG capsule Take 20 mg by mouth daily.    [provider]  polyethylene glycol (MIRALAX / GLYCOLAX) 17 g packet Take 17 g by mouth daily. 12/31/18   Meuth, Brooke A, [name]     Allergies Penicillins and Morphine and related   Family History  Problem Relation Age of Onset   Diabetes Mother    Hypertension Mother    Stroke Father    Heart failure Father    Heart failure Other     Social History Social History   Tobacco Use   Smoking status: Every Day    Packs/day: 0.50    Years: 27.00    Pack years: 13.50    Types: Cigarettes   Smokeless tobacco: Never  Vaping Use   Vaping Use: Never used  Substance Use Topics   Alcohol use: Yes    Alcohol/week: 64.0 standard drinks    Types: 15 Cans of beer, 44 Shots of liquor, 5 Standard drinks or equivalent per week    Comment: 2020 " SOMETIMES DAILY, SOMETIMES I DONT TOUCH ALCOHOL "   Drug use: No    Types: Marijuana    Comment: 10/17/2014 "last marijuana was in the 1990's"    Review of Systems  Constitutional:   No fever or chills.  ENT:   No sore throat. No rhinorrhea. Cardiovascular:   No chest pain or syncope. Respiratory:   No dyspnea or cough. Gastrointestinal:   Positive abdominal pain without vomiting and diarrhea.  Musculoskeletal:   Negative for focal pain or swelling All other systems reviewed and are negative except as documented above in ROS and HPI.  ____________________________________________   PHYSICAL EXAM:  VITAL SIGNS: ED Triage Vitals  Enc Vitals Group     BP 04/06/21 0757 (!) 155/93     Pulse Rate 04/06/21 0757 99     Resp 04/06/21 0757 20     Temp 04/06/21 0757 97.7 F (36.5 C)     Temp Source 04/06/21 0757 Oral     SpO2 04/06/21 0757 98 %     Weight 04/06/21 0757 217 lb (98.4 kg)     Height 04/06/21 0757 5\' 11"  (1.803 m)     Head Circumference --      Peak Flow --      Pain Score 04/06/21 1106 5     Pain Loc --       Pain Edu? --      Excl. in [place]? --     Vital signs reviewed, nursing assessments reviewed.   Constitutional:   Alert and oriented. Non-toxic appearance. Eyes:   Conjunctivae are normal. EOMI. ENT      Head:   Normocephalic and atraumatic.           Neck:   No meningismus.  Full ROM. Hematological/Lymphatic/Immunilogical:   No inguinal lymphadenopathy. Cardiovascular:   RRR.  Respiratory:   Normal respiratory effort without tachypnea/retractions. Gastrointestinal:   Soft and nontender. Non distended.   No rebound, rigidity, or guarding.  There is an indirect right inguinal hernia which is soft and nontender.  There is direct left inguinal hernia which is easily reducible. Musculoskeletal:   Normal range of motion in all extremities.  No edema. Neurologic:   Normal speech and language.  Motor grossly intact. No acute focal neurologic deficits are appreciated.  Skin:    Skin is warm, dry and intact. No rash noted.  No wounds.  ____________________________________________    LABS (pertinent positives/negatives) (all labs ordered are listed, but only abnormal results are displayed) Labs Reviewed  COMPREHENSIVE METABOLIC PANEL - Abnormal; Notable for the following components:      Result Value   Glucose, Bld 120 (*)    All other components within normal limits  CBC - Abnormal; Notable for the following components:   RDW 18.8 (*)    All other components within normal limits  URINALYSIS, COMPLETE (UACMP) WITH MICROSCOPIC - Abnormal; Notable for the following components:   Hgb urine dipstick TRACE (*)    Bacteria, UA RARE (*)    All other components within normal limits  LIPASE, BLOOD   ____________________________________________   EKG  ____________________________________________    RADIOLOGY  US SCROTUM W/DOPPLER  Result Date: 04/06/2021 CLINICAL DATA:  65 year old with scrotal pain and swelling. EXAM: SCROTAL ULTRASOUND DOPPLER ULTRASOUND OF THE TESTICLES TECHNIQUE:  Complete ultrasound examination of the testicles, epididymis, and other scrotal structures was performed. Color and spectral Doppler ultrasound were also utilized to evaluate blood flow to the testicles. COMPARISON:  09/30/2015 and CT of the abdomen and pelvis 11/27/2019 FINDINGS: Right testicle Measurements: 4.8 x 2.3 x 3.5 cm. No mass or microlithiasis visualized. Left testicle Measurements: 4.4 x 2.6 x 3.3 cm. No mass or microlithiasis visualized. Right epididymis:  Normal in size and appearance. Left epididymis:  Normal in size and appearance. Hydrocele:  None visualized. Varicocele:  None visualized. Pulsed Doppler interrogation of both testes demonstrates normal low resistance arterial and venous waveforms bilaterally. Inguinal canals: Bilateral inguinal hernias. Patient has known inguinal hernias based on previous CT. Inguinal hernias probably contain fat and bowel structures. IMPRESSION: 1. Normal appearance of both testicles and no evidence for testicular torsion. 2. Bilateral inguinal hernias that may be containing bowel. This could be better characterized with CT. Electronically Signed   By: Markus Daft M.D.   On: 04/06/2021 10:10    ____________________________________________   PROCEDURES Procedures  ____________________________________________  CLINICAL IMPRESSION / ASSESSMENT AND PLAN / ED COURSE  Pertinent labs & imaging results that were available during my care of the patient were reviewed by me and considered in my medical decision making (see chart for details).  Obi Stepfon Rawles was evaluated in Emergency Department on 04/06/2021 for the symptoms described in the history of present illness. He was evaluated in the context of the global COVID-19 pandemic, which necessitated consideration that the patient might be at risk for infection with the SARS-CoV-2 virus that causes COVID-19. Institutional protocols and algorithms that pertain to the evaluation of patients at risk for  COVID-19 are in a state of rapid change based on information released by regulatory bodies including the CDC and federal and state organizations. These policies and algorithms were followed during the patient's care in the ED.   Patient presents with increased pain in his chronic left inguinal hernia.  This was easily reduced but also recurrent.  Counseled him on inguinal hernia belt, avoid standing or heavy lifting, follow-up with surgery for repair.  Return precautions discussed.      ____________________________________________   FINAL CLINICAL IMPRESSION(S) / ED DIAGNOSES    Final diagnoses:  Bilateral recurrent inguinal hernia without obstruction or gangrene     ED Discharge Orders     None       Portions of this note were generated with dragon dictation software. Dictation errors may occur despite best attempts at proofreading.   Carrie Mew, MD 04/06/21 1159

## 2021-04-06 NOTE — Discharge Instructions (Addendum)
Please obtain an inguinal hernia belt to support the hernia area and avoid worsening.  Return to the ER if you have severe unremitting pain, vomiting, inability to have a bowel movement, or other new concerns.

## 2021-04-06 NOTE — ED Triage Notes (Signed)
Pt states he has BL inguinal hernias and is c/o burning pain to the left side and states he has gotten a little larger since last night. Pt is in NAD at this time. Ambulatory on arrival with a steady gait

## 2021-04-06 NOTE — ED Notes (Signed)
See triage note  presents with pain to bilateral groin areas  left is worse than the right  denies nay trauma  but has hx of hernias

## 2021-04-11 ENCOUNTER — Ambulatory Visit (INDEPENDENT_AMBULATORY_CARE_PROVIDER_SITE_OTHER): Payer: 59 | Admitting: Physician Assistant

## 2021-04-11 ENCOUNTER — Other Ambulatory Visit: Payer: Self-pay

## 2021-04-11 ENCOUNTER — Telehealth: Payer: Self-pay

## 2021-04-11 ENCOUNTER — Encounter: Payer: Self-pay | Admitting: Surgery

## 2021-04-11 VITALS — BP 138/93 | HR 103 | Temp 98.5°F | Ht 71.0 in | Wt 229.4 lb

## 2021-04-11 DIAGNOSIS — K4021 Bilateral inguinal hernia, without obstruction or gangrene, recurrent: Secondary | ICD-10-CM | POA: Diagnosis not present

## 2021-04-11 DIAGNOSIS — K4020 Bilateral inguinal hernia, without obstruction or gangrene, not specified as recurrent: Secondary | ICD-10-CM

## 2021-04-11 DIAGNOSIS — K429 Umbilical hernia without obstruction or gangrene: Secondary | ICD-10-CM

## 2021-04-11 NOTE — Telephone Encounter (Signed)
Medical Clearance faxed to Dr.Stephen Sherilyn Cooter at [phone].

## 2021-04-11 NOTE — Patient Instructions (Addendum)
CT abdomen and pelvis scheduled 04/30/21 @ 2:45 pm @ [facility]. Nothing to eat/drink 4 hours prior to this scan.  Please go to [facility] today and pick up your prep kit and instructions for this scan.  [facility]  [address] [place] [address]  Medical Clearance was faxed to [place] call his office to let him know this has been faxed to his office and to see if you need to schedule an office visit prior to the Medical Clearance being provided.    Our surgery scheduler will call you within 24-48 hours to schedule your surgery. Please have the [facility] surgery sheet available when speaking with her.   Inguinal Hernia, Adult An inguinal hernia develops when fat or the intestines push through a weak spot in a muscle where the leg meets the lower abdomen (groin). This creates a bulge. This kind of hernia could also be: In the scrotum, if you are male. In folds of skin around the vagina, if you are male. There are three types of inguinal hernias: Hernias that can be pushed back into the abdomen (are reducible). This type rarely causes pain. Hernias that are not reducible (are incarcerated). Hernias that are not reducible and lose their blood supply (are strangulated). This type of hernia requires emergency surgery. What are the causes? This condition is caused by having a weak spot in the muscles or tissues in your groin. This develops over time. The hernia may poke through the weak spot when you suddenly strain your lower abdominal muscles, such as when you: Lift a heavy object. Strain to have a bowel movement. Constipation can lead to straining. Cough. What increases the risk? This condition is more likely to develop in: Males. Pregnant females. People who: Are overweight. Work in jobs that require long periods of standing or heavy lifting. Have had an inguinal hernia before. Smoke or have lung disease. These factors can lead to  long-term (chronic) coughing. What are the signs or symptoms? Symptoms may depend on the size of the hernia. Often, a small inguinal hernia has no symptoms. Symptoms of a larger hernia may include: A bulge in the groin area. This is easier to see when standing. It might not be visible when lying down. Pain or burning in the groin. This may get worse when lifting, straining, or coughing. A dull ache or a feeling of pressure in the groin. An unusual bulge in the scrotum, in males. Symptoms of a strangulated inguinal hernia may include: A bulge in your groin that is very painful and tender to the touch. A bulge that turns red or purple. Fever, nausea, and vomiting. Inability to have a bowel movement or to pass gas. How is this diagnosed? This condition is diagnosed based on your symptoms, your medical history, and a physical exam. Your health care provider may feel your groin area and ask you to cough. How is this treated? Treatment depends on the size of your hernia and whether you have symptoms. If you do not have symptoms, your health care provider may have you watch your hernia carefully and have you come in for follow-up visits. If your hernia is large or if you have symptoms, you may need surgery to repair the hernia. Follow these instructions at home: Lifestyle Avoid lifting heavy objects. Avoid standing for long periods of time. Do not use any products that contain nicotine or tobacco. These products include cigarettes, chewing tobacco, and vaping devices, such as e-cigarettes. If you need help  quitting, ask your health care provider. Maintain a healthy weight. Preventing constipation You may need to take these actions to prevent or treat constipation: Drink enough fluid to keep your urine pale yellow. Take over-the-counter or prescription medicines. Eat foods that are high in fiber, such as beans, whole grains, and fresh fruits and vegetables. Limit foods that are high in fat and  processed sugars, such as fried or sweet foods. General instructions You may try to push the hernia back in place by very gently pressing on it while lying down. Do not try to force the bulge back in if it will not push in easily. Watch your hernia for any changes in shape, size, or color. Get help right away if you notice any changes. Take over-the-counter and prescription medicines only as told by your health care provider. Keep all follow-up visits. This is important. Contact a health care provider if: You have a fever or chills. You develop new symptoms. Your symptoms get worse. Get help right away if: You have pain in your groin that suddenly gets worse. You have a bulge in your groin that: Suddenly gets bigger and does not get smaller. Becomes red or purple or painful to the touch. You are a man and you have a sudden pain in your scrotum, or the size of your scrotum suddenly changes. You cannot push the hernia back in place by very gently pressing on it when you are lying down. You have nausea or vomiting that does not go away. You have a fast heartbeat. You cannot have a bowel movement or pass gas. These symptoms may represent a serious problem that is an emergency. Do not wait to see if the symptoms will go away. Get medical help right away. Call your local emergency services (911 in the [place] Summary An inguinal hernia develops when fat or the intestines push through a weak spot in a muscle where your leg meets your lower abdomen (groin). This condition is caused by having a weak spot in muscles or tissues in your groin. Symptoms may depend on the size of the hernia, and they may include pain or swelling in your groin. A small inguinal hernia often has no symptoms. Treatment may not be needed if you do not have symptoms. If you have symptoms or a large hernia, you may need surgery to repair the hernia. Avoid lifting heavy objects. Also, avoid standing for long periods of  time. This information is not intended to replace advice given to you by your health care provider. Make sure you discuss any questions you have with your health care provider. Document Revised: 02/22/2020 Document Reviewed: 02/22/2020 Elsevier Patient Education  2022 [employer].

## 2021-04-11 NOTE — Telephone Encounter (Signed)
Received call back from [facility] with [facility] [phone]).   She just received fax from our office for medical clearance for this patient.  She states that they are an urgent walk in clinic, not PCP.  They did arrange for patient to establish care with a PCP back in March but patient no showed that appointment.  So it looks like patient does not have a PCP at this point from what they can gather on their end.

## 2021-04-11 NOTE — Progress Notes (Signed)
[facility] NEW PATIENT  Referring provider:  Emelia Loron, NP [place],  [place] [address]  HISTORY OF PRESENT ILLNESS (HPI):  65 y.o. male presents for evaluation of bilateral inguinal hernias. Patient reports he has had a history of bilateral inguinal hernia for the last few years. In the past, these have always been soft and reducible. He reports that in the last few weeks he has started a job in which he is standing more. He reports that this has exacerbated is hernias and made them tougher to reduce. These are intermittently painful. He presented to the ED on 09/30 for these and had scrotal US which confirmed bilateral inguinal hernias. Last CT was in May of 2021 which showed some bladder involvement. He did not follow up with urologist. He otherwise denied any fever, chills, nausea, emesis, urinary changes, or bowel changes. He does have a history of exploratory laparotomy in the 4270W for eosinophilic enteritis in which he had a portion of the small bowel resected. He has had no issues since that time. No bowel obstructions. No other intra-abdominal surgeries. He is able to preform ADLs without issues. No other complaints.   PAST MEDICAL HISTORY (PMH):  Past Medical History:  Diagnosis Date   Acid reflux    Alcohol abuse    Arthritis    "left pinky" (10/17/2014)   DDD (degenerative disc disease), lumbar    Gastroenteritis    History of stress test 2001   no ischemia   Hypertension    Inguinal hernia    Ribs, multiple fractures 12/2018   Tobacco abuse    Vertebral compression fracture (HCC)    T11, L1     PAST SURGICAL HISTORY ([place]):  Past Surgical History:  Procedure Laterality Date   COLON SURGERY     EXPLORATORY LAPAROTOMY W/ BOWEL RESECTION  1980's     MEDICATIONS:  Prior to Admission medications   Medication Sig Start Date End Date Taking? Authorizing Provider  acetaminophen (TYLENOL) 325 MG tablet Take 2 tablets  (650 mg total) by mouth every 6 (six) hours. 12/28/18  Yes Meuth, Brooke A, [name]  albuterol (VENTOLIN HFA) 108 (90 Base) MCG/ACT inhaler Inhale 2 puffs into the lungs every 6 (six) hours as needed for wheezing or shortness of breath. 03/20/20  Yes Lucrezia Starch, MD  amLODipine (NORVASC) 5 MG tablet Take 1 tablet (5 mg total) by mouth daily. 11/24/20 11/24/21 Yes Nance Pear, MD  aspirin 81 MG EC tablet Take 1 tablet (81 mg total) by mouth daily. 04/09/18  Yes Gladstone Lighter, MD  bacitracin ointment Apply topically 2 (two) times daily. 12/30/18  Yes Meuth, Brooke A, [name]  bisacodyl (DULCOLAX) 10 MG suppository Place 1 suppository (10 mg total) rectally daily as needed for moderate constipation. 12/30/18  Yes Meuth, Brooke A, [name]  docusate sodium (COLACE) 100 MG capsule Take 1 capsule (100 mg total) by mouth 2 (two) times daily. 12/30/18  Yes Meuth, Brooke A, [name]  Multiple Vitamin (MULTIVITAMIN WITH MINERALS) TABS tablet Take 1 tablet by mouth daily. 12/29/18  Yes Meuth, Brooke A, [name]  omeprazole (PRILOSEC) 20 MG capsule Take 20 mg by mouth daily.   Yes [provider]     ALLERGIES:  Allergies  Allergen Reactions   Penicillins Anaphylaxis    Has patient had a PCN reaction causing immediate rash, facial/tongue/throat swelling, SOB or lightheadedness with hypotension: Yes Has patient had a PCN reaction causing severe rash involving mucus membranes or skin necrosis: No Has  patient had a PCN reaction that required hospitalization Yes Has patient had a PCN reaction occurring within the last 10 years: No If all of the above answers are "NO", then may proceed with Cephalosporin use.   Morphine And Related Itching     SOCIAL HISTORY:  Social History   Socioeconomic History   Marital status: Single    Spouse name: Not on file   Number of children: Not on file   Years of education: Not on file   Highest education level: Not on file  Occupational History   Not on file  Tobacco  Use   Smoking status: Every Day    Packs/day: 0.50    Years: 27.00    Pack years: 13.50    Types: Cigarettes   Smokeless tobacco: Never  Vaping Use   Vaping Use: Never used  Substance and Sexual Activity   Alcohol use: Yes    Alcohol/week: 64.0 standard drinks    Types: 15 Cans of beer, 44 Shots of liquor, 5 Standard drinks or equivalent per week    Comment: 2020 " SOMETIMES DAILY, SOMETIMES I DONT TOUCH ALCOHOL "   Drug use: Yes    Types: Marijuana    Comment: 10/17/2014 "last marijuana was in the 1990's"   Sexual activity: Yes    Birth control/protection: None  Other Topics Concern   Not on file  Social History Narrative   Not on file   Social Determinants of Health   Financial Resource Strain: Not on file  Food Insecurity: Not on file  Transportation Needs: Not on file  Physical Activity: Not on file  Stress: Not on file  Social Connections: Not on file  Intimate Partner Violence: Not on file    The patient currently resides (home / rehab facility / nursing home): Home The patient normally is (ambulatory / bedbound): Ambulatory  FAMILY HISTORY:  Family History  Problem Relation Age of Onset   Diabetes Mother    Hypertension Mother    Stroke Father    Heart failure Father    Heart failure Other     Otherwise negative/non-contributory.  REVIEW OF SYSTEMS:  Review of Systems  Constitutional:  Negative for chills and fever.  HENT:  Negative for congestion and sore throat.   Respiratory:  Negative for cough and shortness of breath.   Cardiovascular:  Negative for chest pain and palpitations.  Gastrointestinal:  Positive for abdominal pain. Negative for constipation, diarrhea, nausea and vomiting.       + Bilateral Inguinal Hernia   Genitourinary:  Negative for dysuria and urgency.  All other systems reviewed and are negative.   VITAL SIGNS:  @VSRANGES @     Height: 5\' 11"  (180.3 cm) Weight: 229 lb 6.4 oz (104.1 kg) BMI (Calculated): 32.01   PHYSICAL EXAM:   Physical Exam Vitals and nursing note reviewed. Exam conducted with a chaperone present.  Constitutional:      General: He is not in acute distress.    Appearance: Normal appearance. He is obese. He is not ill-appearing.  HENT:     Head: Normocephalic and atraumatic.  Eyes:     General: No scleral icterus.    Conjunctiva/sclera: Conjunctivae normal.  Cardiovascular:     Rate and Rhythm: Normal rate.     Pulses: Normal pulses.     Heart sounds: Normal heart sounds.  Pulmonary:     Effort: Pulmonary effort is normal. No respiratory distress.     Breath sounds: Normal breath sounds.  Abdominal:  General: A surgical scar is present. There is no distension.     Palpations: Abdomen is soft.     Tenderness: There is no abdominal tenderness. There is no guarding or rebound.     Hernia: A hernia is present. Hernia is present in the umbilical area, left inguinal area and right inguinal area.     Comments: Abdomen is soft, non-tender, non-distended, no rebound/guarding. Previous laparotomy scar seen. He does have an umbilical hernia, soft, reducible. Bilateral inguinal hernias, soft, reducible, non-tender  Musculoskeletal:        General: Normal range of motion.     Right lower leg: No edema.     Left lower leg: No edema.  Skin:    General: Skin is warm and dry.     Coloration: Skin is not pale.     Findings: No erythema.  Neurological:     General: No focal deficit present.     Mental Status: He is alert and oriented to person, place, and time.  Psychiatric:        Mood and Affect: Mood normal.        Behavior: Behavior normal.     Imaging studies:   CT Abdomen/Pelvis (11/27/2019) personally reviewed showing bilateral inguinal hernias, urinary bladder herniation, and radiologist report reviewed below:  IMPRESSION: Persisting herniation of the right bladder dome through a right inguinal hernia. There is evidence of entrapped urine within the urinary bladder of the hernia sac,  with chronic urinary bladder wall inflammation/cystitis. Correlation with urinalysis is recommended. Follow-up with urology is also recommended to consider cystoscopy/cytology studies, as early urothelial neoplasm cannot be excluded on CT.   Redemonstration of left inguinal hernia containing a sidewall of the sigmoid colon. Local inflammation at this site includes edema of the adjacent fat an local wall thickening of the sigmoid colon. These findings suggest local colitis, potentially infectious or inflammatory and less likely ischemic.   Additional ancillary findings as above.   US Scrotum (04/06/2021) personally reviewed showing bilateral inguinal hernias, and radiologist report reviewed below:  IMPRESSION: 1. Normal appearance of both testicles and no evidence for testicular torsion. 2. Bilateral inguinal hernias that may be containing bowel. This could be better characterized with CT.    Assessment/Plan:  65 y.o. male with bilateral inguinal hernias and umbilical hernia.   - Given his complex surgical history and bilateral inguinal hernia, we will obtain CT Abdomen/Pelvis to re-evaluate his anatomy and hernia contents. We will call him with the results - Will plan for robotic assisted laparoscopic bilateral inguinal hernia repair and umbilical hernia repair with Dr Dahlia Byes. We will go ahead and schedule this.  - All risks, benefits, and alternatives to above elective procedure(s) were discussed with the patient, all of his questions were answered to his expressed satisfaction, patient expresses he wishes to proceed, and informed consent was obtained.       - We will call him with CT results. He will follow for surgery  All of the above recommendations were discussed with the patient, and all of patient's questions were answered to his expressed satisfaction.  Thank you for the opportunity to participate in this patient's care.  Face-to-face time spent with the patient and care  providers was 45 minutes, with more than 50% of the time spent counseling, educating, and coordinating care of the patient.    -- Edison Simon, [name] [facility] 04/11/2021, 2:19 PM [phone] M-F: 7am - 4pm

## 2021-04-11 NOTE — Telephone Encounter (Signed)
Patient has been made aware that the [facility] will not be able to provide clearance for his surgery.  Patient does admit not having a PCP.  He is given the numbers for [facility] and [facility] in hoped that one of those clinics will be able to get him worked in to establish care and for medical clearance.  He will call us and let us know once he has done this so that we can have a better time frame in when to schedule his surgery.

## 2021-04-18 ENCOUNTER — Telehealth: Payer: Self-pay | Admitting: Surgery

## 2021-04-18 NOTE — Telephone Encounter (Signed)
Patient has been advised of Pre-Admission date/time, COVID Testing date and Surgery date.  Surgery Date: 06/12/21 Preadmission Testing Date: 06/04/21 (phone 8a-1p) Covid Testing Date: Not needed.     Patient has been made aware to call [phone], between 1-3:00pm the day before surgery, to find out what time to arrive for surgery.   Patient is also scheduled for follow up on 06/06/21 with Dr. Dahlia Byes prior to surgery.

## 2021-04-30 ENCOUNTER — Ambulatory Visit
Admission: RE | Admit: 2021-04-30 | Discharge: 2021-04-30 | Disposition: A | Discharge status: Home / Self Care | Payer: 59 | Source: Ambulatory Visit | Attending: Surgery | Admitting: Surgery

## 2021-04-30 ENCOUNTER — Other Ambulatory Visit: Payer: Self-pay

## 2021-04-30 DIAGNOSIS — K4020 Bilateral inguinal hernia, without obstruction or gangrene, not specified as recurrent: Secondary | ICD-10-CM | POA: Diagnosis present

## 2021-04-30 MED ORDER — IOHEXOL 350 MG/ML SOLN
100.0000 mL | Freq: Once | INTRAVENOUS | Status: AC | PRN
  Administered 2021-04-30: 100 mL via INTRAVENOUS

## 2021-05-01 ENCOUNTER — Telehealth: Payer: Self-pay

## 2021-05-01 NOTE — Telephone Encounter (Signed)
Spoke with patient regarding CT results-reminded of appointment 06/06/21 @ 1:30 pm.

## 2021-05-28 NOTE — Progress Notes (Signed)
Request for Medical clearance has been faxed to Dr Cletis Athens.

## 2021-05-29 ENCOUNTER — Encounter: Payer: Self-pay | Admitting: Internal Medicine

## 2021-05-29 ENCOUNTER — Ambulatory Visit (INDEPENDENT_AMBULATORY_CARE_PROVIDER_SITE_OTHER): Payer: 59 | Admitting: Internal Medicine

## 2021-05-29 DIAGNOSIS — K219 Gastro-esophageal reflux disease without esophagitis: Secondary | ICD-10-CM | POA: Diagnosis not present

## 2021-05-29 DIAGNOSIS — K4021 Bilateral inguinal hernia, without obstruction or gangrene, recurrent: Secondary | ICD-10-CM | POA: Diagnosis not present

## 2021-05-29 DIAGNOSIS — Z720 Tobacco use: Secondary | ICD-10-CM

## 2021-05-29 DIAGNOSIS — I10 Essential (primary) hypertension: Secondary | ICD-10-CM

## 2021-05-29 DIAGNOSIS — F1010 Alcohol abuse, uncomplicated: Secondary | ICD-10-CM

## 2021-05-29 MED ORDER — ALBUTEROL SULFATE HFA 108 (90 BASE) MCG/ACT IN AERS: 2.0000 | INHALATION_SPRAY | Freq: Four times a day (QID) | RESPIRATORY_TRACT | 1 refills | Status: DC | PRN

## 2021-05-29 MED ORDER — AMLODIPINE BESYLATE 5 MG PO TABS: 5.0000 mg | ORAL_TABLET | Freq: Every day | ORAL | 1 refills | Status: DC

## 2021-05-29 NOTE — Assessment & Plan Note (Signed)

## 2021-05-29 NOTE — Assessment & Plan Note (Signed)
Refer to the surgeon 

## 2021-05-29 NOTE — Assessment & Plan Note (Signed)
-   I instructed the patient to stop smoking and provided them with smoking cessation materials.  - I informed the patient that smoking puts them at increased risk for cancer, COPD, hypertension, and more.  - Informed the patient to seek help if they begin to have trouble breathing, develop chest pain, start to cough up blood, feel faint, or pass out.  

## 2021-05-29 NOTE — Assessment & Plan Note (Signed)
-   The patient's GERD is stable on medication.  - Instructed the patient to avoid eating spicy and acidic foods, as well as foods high in fat. - Instructed the patient to avoid eating large meals or meals 2-3 hours prior to sleeping.  Take Prilosec 20 mg p.o. twice a day, he was also advised to see his stomach doctor before surgery for the hernia

## 2021-05-29 NOTE — Assessment & Plan Note (Signed)
Patient was advised to stop drinking 

## 2021-05-29 NOTE — Progress Notes (Signed)
Established Patient Office Visit  Subjective:  Patient ID: [name], male    DOB: March 17, 1956  Age: 65 y.o. MRN: [id number]  CC:  Chief Complaint  Patient presents with   New Patient (Initial Visit)    HPI  [name] presents for bilateral hernia  Past Medical History:  Diagnosis Date   Acid reflux    Alcohol abuse    Arthritis    "left pinky" (10/17/2014)   DDD (degenerative disc disease), lumbar    Gastroenteritis    History of stress test 2001   no ischemia   Hypertension    Inguinal hernia    Ribs, multiple fractures 12/2018   Tobacco abuse    Vertebral compression fracture (HCC)    T11, L1    Past Surgical History:  Procedure Laterality Date   COLON SURGERY     EXPLORATORY LAPAROTOMY W/ BOWEL RESECTION  33's    Family History  Problem Relation Age of Onset   Kidney disease Mother    Diabetes Mother    Hypertension Mother    Heart disease Father    Cancer Father    Stroke Father    Heart failure Father    Heart failure Other     Social History   Socioeconomic History   Marital status: Married    Spouse name: Not on file   Number of children: 3   Years of education: Not on file   Highest education level: Associate degree: occupational, Hotel manager, or vocational program  Occupational History   Occupation: Retired  Tobacco Use   Smoking status: Every Day    Packs/day: 0.50    Years: 27.00    Pack years: 13.50    Types: Cigarettes   Smokeless tobacco: Never  Vaping Use   Vaping Use: Never used  Substance and Sexual Activity   Alcohol use: Yes    Alcohol/week: 7.0 standard drinks    Types: 7 Standard drinks or equivalent per week    Comment: 2020 " SOMETIMES DAILY, SOMETIMES I DONT TOUCH ALCOHOL "   Drug use: Not Currently    Types: Marijuana    Comment: 10/17/2014 "last marijuana was in the 1990's"   Sexual activity: Yes    Birth control/protection: None  Other Topics Concern   Not on file  Social History  Narrative   Not on file   Social Determinants of Health   Financial Resource Strain: Not on file  Food Insecurity: Not on file  Transportation Needs: Not on file  Physical Activity: Not on file  Stress: Not on file  Social Connections: Not on file  Intimate Partner Violence: Not on file     Current Outpatient Medications:    Multiple Vitamin (MULTIVITAMIN WITH MINERALS) TABS tablet, Take 1 tablet by mouth daily., Disp:  , Rfl:    omeprazole (PRILOSEC) 20 MG capsule, Take 20 mg by mouth daily., Disp: , Rfl:    albuterol (VENTOLIN HFA) 108 (90 Base) MCG/ACT inhaler, Inhale 2 puffs into the lungs every 6 (six) hours as needed for wheezing or shortness of breath., Disp: 3 each, Rfl: 1   amLODipine (NORVASC) 5 MG tablet, Take 1 tablet (5 mg total) by mouth daily., Disp: 90 tablet, Rfl: 1   Allergies  Allergen Reactions   Penicillins Anaphylaxis    Has patient had a PCN reaction causing immediate rash, facial/tongue/throat swelling, SOB or lightheadedness with hypotension: Yes Has patient had a PCN reaction causing severe rash involving mucus membranes or skin necrosis: No  Has patient had a PCN reaction that required hospitalization Yes Has patient had a PCN reaction occurring within the last 10 years: No If all of the above answers are "NO", then may proceed with Cephalosporin use.   Morphine And Related Itching    ROS Review of Systems  Constitutional: Negative.   HENT: Negative.    Eyes: Negative.   Respiratory:  Positive for shortness of breath. Negative for cough, choking and chest tightness.   Cardiovascular:  Positive for chest pain (fracture rib  on rt side).  Gastrointestinal: Negative.  Negative for abdominal pain and blood in stool.  Endocrine: Negative.  Negative for polydipsia and polyphagia.  Genitourinary:  Positive for dysuria. Negative for hematuria and testicular pain.  Musculoskeletal:  Positive for arthralgias.  Skin: Negative.   Allergic/Immunologic:  Negative.   Neurological:  Positive for headaches.  Hematological: Negative.   Psychiatric/Behavioral: Negative.  Negative for agitation and behavioral problems.   All other systems reviewed and are negative.    Objective:    Physical Exam Constitutional:      Appearance: Normal appearance. He is normal weight.  HENT:     Head: Normocephalic.  Eyes:     Pupils: Pupils are equal, round, and reactive to light.  Cardiovascular:     Rate and Rhythm: Normal rate and regular rhythm.     Pulses: Normal pulses.     Heart sounds: Normal heart sounds.  Pulmonary:     Effort: Pulmonary effort is normal.  Abdominal:     General: Bowel sounds are normal.     Palpations: Abdomen is soft.     Tenderness: There is no abdominal tenderness. There is no guarding.       Comments: Rt inguinal hernia  Neurological:     Mental Status: He is alert.    There were no vitals taken for this visit. Wt Readings from Last 3 Encounters:  04/11/21 229 lb 6.4 oz (104.1 kg)  04/06/21 217 lb (98.4 kg)  11/24/20 224 lb (101.6 kg)     Health Maintenance Due  Topic Date Due   Hepatitis C Screening  Never done    There are no preventive care reminders to display for this patient.  Lab Results  Component Value Date   TSH 1.969 11/24/2020   Lab Results  Component Value Date   WBC 7.4 04/06/2021   HGB 13.3 04/06/2021   HCT 39.4 04/06/2021   MCV 90.0 04/06/2021   PLT 256 04/06/2021   Lab Results  Component Value Date   NA 138 04/06/2021   K 3.6 04/06/2021   CO2 26 04/06/2021   GLUCOSE 120 (H) 04/06/2021   BUN 14 04/06/2021   CREATININE 0.93 04/06/2021   BILITOT 0.8 04/06/2021   ALKPHOS 51 04/06/2021   AST 18 04/06/2021   ALT 14 04/06/2021   PROT 7.5 04/06/2021   ALBUMIN 4.4 04/06/2021   CALCIUM 8.9 04/06/2021   ANIONGAP 10 04/06/2021   Lab Results  Component Value Date   CHOL 173 04/08/2018   Lab Results  Component Value Date   HDL 66 04/08/2018   Lab Results  Component  Value Date   LDLCALC 92 04/08/2018   Lab Results  Component Value Date   TRIG 74 04/08/2018   Lab Results  Component Value Date   CHOLHDL 2.6 04/08/2018   Lab Results  Component Value Date   HGBA1C 5.4 04/08/2018      Assessment & Plan:   Problem List Items Addressed This Visit  Cardiovascular and Mediastinum   Hypertension - Primary     Patient denies any chest pain or shortness of breath there is no history of palpitation or paroxysmal nocturnal dyspnea   patient was advised to follow low-salt low-cholesterol diet    ideally I want to keep systolic blood pressure below 130 mmHg, patient was asked to check blood pressure one times a week and give me a report on that.  Patient will be follow-up in 3 months  or earlier as needed, patient will call me back for any change in the cardiovascular symptoms Patient was advised to buy a book from local bookstore concerning blood pressure and read several chapters  every day.  This will be supplemented by some of the material we will give him from the office.  Patient should also utilize other resources like YouTube and Internet to learn more about the blood pressure and the diet.      Relevant Medications   amLODipine (NORVASC) 5 MG tablet     Digestive   Acid reflux    - The patient's GERD is stable on medication.  - Instructed the patient to avoid eating spicy and acidic foods, as well as foods high in fat. - Instructed the patient to avoid eating large meals or meals 2-3 hours prior to sleeping.  Take Prilosec 20 mg p.o. twice a day, he was also advised to see his stomach doctor before surgery for the hernia        Other   Tobacco abuse    - I instructed the patient to stop smoking and provided them with smoking cessation materials.  - I informed the patient that smoking puts them at increased risk for cancer, COPD, hypertension, and more.  - Informed the patient to seek help if they begin to have trouble breathing,  develop chest pain, start to cough up blood, feel faint, or pass out.      Alcohol abuse    Patient was advised to stop drinking      Bilateral recurrent inguinal hernia without obstruction or gangrene    Refer to the surgeon       Meds ordered this encounter  Medications   amLODipine (NORVASC) 5 MG tablet    Sig: Take 1 tablet (5 mg total) by mouth daily.    Dispense:  90 tablet    Refill:  1   albuterol (VENTOLIN HFA) 108 (90 Base) MCG/ACT inhaler    Sig: Inhale 2 puffs into the lungs every 6 (six) hours as needed for wheezing or shortness of breath.    Dispense:  3 each    Refill:  1    Follow-up: No follow-ups on file.    Cletis Athens, MD

## 2021-06-04 ENCOUNTER — Emergency Department: Payer: 59

## 2021-06-04 ENCOUNTER — Other Ambulatory Visit: Payer: Self-pay

## 2021-06-04 ENCOUNTER — Emergency Department
Admission: EM | Admit: 2021-06-04 | Discharge: 2021-06-04 | Disposition: A | Discharge status: Home / Self Care | Payer: 59 | Attending: Emergency Medicine | Admitting: Emergency Medicine

## 2021-06-04 ENCOUNTER — Inpatient Hospital Stay
Admission: RE | Admit: 2021-06-04 | Discharge: 2021-06-04 | Disposition: A | Discharge status: Home / Self Care | Payer: 59 | Source: Ambulatory Visit | Attending: Surgery | Admitting: Surgery

## 2021-06-04 ENCOUNTER — Encounter: Payer: Self-pay | Admitting: Emergency Medicine

## 2021-06-04 DIAGNOSIS — Z79899 Other long term (current) drug therapy: Secondary | ICD-10-CM | POA: Diagnosis not present

## 2021-06-04 DIAGNOSIS — K529 Noninfective gastroenteritis and colitis, unspecified: Secondary | ICD-10-CM | POA: Insufficient documentation

## 2021-06-04 DIAGNOSIS — I10 Essential (primary) hypertension: Secondary | ICD-10-CM | POA: Diagnosis not present

## 2021-06-04 DIAGNOSIS — R1084 Generalized abdominal pain: Secondary | ICD-10-CM

## 2021-06-04 DIAGNOSIS — F17210 Nicotine dependence, cigarettes, uncomplicated: Secondary | ICD-10-CM | POA: Insufficient documentation

## 2021-06-04 LAB — URINALYSIS, ROUTINE W REFLEX MICROSCOPIC
Bacteria, UA: NONE SEEN
Glucose, UA: NEGATIVE mg/dL
Hgb urine dipstick: NEGATIVE
Ketones, ur: NEGATIVE mg/dL
Leukocytes,Ua: NEGATIVE
Nitrite: NEGATIVE
Protein, ur: 100 mg/dL — AB
Specific Gravity, Urine: >1.03 — ABNORMAL HIGH (ref 1.005–1.030)
Squamous Epithelial / HPF: NONE SEEN (ref 0–5)
pH: 6 (ref 5.0–8.0)

## 2021-06-04 LAB — COMPREHENSIVE METABOLIC PANEL
ALT: 14 U/L (ref 0–44)
AST: 20 U/L (ref 15–41)
Albumin: 4.1 g/dL (ref 3.5–5.0)
Alkaline Phosphatase: 75 U/L (ref 38–126)
Anion gap: 8 (ref 5–15)
BUN: 8 mg/dL (ref 8–23)
CO2: 27 mmol/L (ref 22–32)
Calcium: 8.5 mg/dL — ABNORMAL LOW (ref 8.9–10.3)
Chloride: 98 mmol/L (ref 98–111)
Creatinine, Ser: 0.93 mg/dL (ref 0.61–1.24)
GFR, Estimated: >60 mL/min (ref >60)
Glucose, Bld: 145 mg/dL — ABNORMAL HIGH (ref 70–99)
Potassium: 3 mmol/L — ABNORMAL LOW (ref 3.5–5.1)
Sodium: 133 mmol/L — ABNORMAL LOW (ref 135–145)
Total Bilirubin: 0.4 mg/dL (ref 0.3–1.2)
Total Protein: 7.4 g/dL (ref 6.5–8.1)

## 2021-06-04 LAB — CBC
HCT: 42.5 % (ref 39.0–52.0)
Hemoglobin: 14.3 g/dL (ref 13.0–17.0)
MCH: 30.4 pg (ref 26.0–34.0)
MCHC: 33.6 g/dL (ref 30.0–36.0)
MCV: 90.2 fL (ref 80.0–100.0)
Platelets: 322 10*3/uL (ref 150–400)
RBC: 4.71 MIL/uL (ref 4.22–5.81)
RDW: 19.1 % — ABNORMAL HIGH (ref 11.5–15.5)
WBC: 38.1 10*3/uL — ABNORMAL HIGH (ref 4.0–10.5)
nRBC: 0.1 % (ref 0.0–0.2)

## 2021-06-04 LAB — LIPASE, BLOOD: Lipase: 23 U/L (ref 11–51)

## 2021-06-04 MED ORDER — ONDANSETRON 4 MG PO TBDP
8.0000 mg | ORAL_TABLET | Freq: Three times a day (TID) | ORAL | Status: DC | PRN
  Administered 2021-06-04: 23:00:00 8 mg via ORAL

## 2021-06-04 MED ORDER — PREDNISONE 20 MG PO TABS
80.0000 mg | ORAL_TABLET | Freq: Once | ORAL | Status: AC
  Administered 2021-06-04: 23:00:00 80 mg via ORAL

## 2021-06-04 MED ORDER — PREDNISONE 20 MG PO TABS
ORAL_TABLET | ORAL | Status: AC
  Filled 2021-06-04: qty 4

## 2021-06-04 MED ORDER — FENTANYL CITRATE PF 50 MCG/ML IJ SOSY
50.0000 ug | PREFILLED_SYRINGE | Freq: Once | INTRAMUSCULAR | Status: AC
  Administered 2021-06-04: 17:00:00 50 ug via INTRAVENOUS
  Filled 2021-06-04: qty 1

## 2021-06-04 MED ORDER — PREDNISONE 20 MG PO TABS: ORAL_TABLET | ORAL | 0 refills | Status: AC

## 2021-06-04 MED ORDER — ONDANSETRON 4 MG PO TBDP
ORAL_TABLET | ORAL | Status: AC
  Filled 2021-06-04: qty 2

## 2021-06-04 MED ORDER — IOHEXOL 300 MG/ML  SOLN
100.0000 mL | Freq: Once | INTRAMUSCULAR | Status: AC | PRN
  Administered 2021-06-04: 22:00:00 100 mL via INTRAVENOUS

## 2021-06-04 MED ORDER — HYDROCODONE-ACETAMINOPHEN 5-325 MG PO TABS
ORAL_TABLET | ORAL | Status: AC
  Filled 2021-06-04: qty 1

## 2021-06-04 MED ORDER — HYDROCODONE-ACETAMINOPHEN 5-325 MG PO TABS: 1.0000 | ORAL_TABLET | ORAL | 0 refills | Status: DC | PRN

## 2021-06-04 MED ORDER — HYDROCODONE-ACETAMINOPHEN 5-325 MG PO TABS
1.0000 | ORAL_TABLET | Freq: Once | ORAL | Status: AC
  Administered 2021-06-04: 23:00:00 1 via ORAL

## 2021-06-04 MED ORDER — ONDANSETRON HCL 4 MG/2ML IJ SOLN
4.0000 mg | Freq: Once | INTRAMUSCULAR | Status: AC
  Administered 2021-06-04: 17:00:00 4 mg via INTRAVENOUS
  Filled 2021-06-04: qty 2

## 2021-06-04 NOTE — Patient Instructions (Signed)
Your procedure is scheduled on: Tuesday 06/12/21 Report to the Registration Desk on the 1st floor of the [facility]. To find out your arrival time, please call [phone] between 1PM - 3PM on: Monday 06/11/21  REMEMBER: Instructions that are not followed completely may result in serious medical risk, up to and including death; or upon the discretion of your surgeon and anesthesiologist your surgery may need to be rescheduled.  Do not eat food after midnight the night before surgery.  No gum chewing, lozengers or hard candies.  You may however, drink CLEAR liquids up to 2 hours before you are scheduled to arrive for your surgery. Do not drink anything within 2 hours of your scheduled arrival time.  Clear liquids include: - water  - apple juice without pulp - gatorade (not RED, PURPLE, OR BLUE) - black coffee or tea (Do NOT add milk or creamers to the coffee or tea) Do NOT drink anything that is not on this list.  TAKE THESE MEDICATIONS THE MORNING OF SURGERY WITH A SIP OF WATER: amLODipine (NORVASC) 5 MG tablet  omeprazole (PRILOSEC) 20 MG capsule (take one the night before and one on the morning of surgery - helps to prevent nausea after surgery.)  Use your albuterol (VENTOLIN HFA) 108 (90 Base) MCG/ACT inhaler on the day of surgery and bring to the hospital.  One week prior to surgery: Stop Anti-inflammatories (NSAIDS) such as Advil, Aleve, Ibuprofen, Motrin, Naproxen, Naprosyn and Aspirin based products such as Excedrin, Goodys Powder, BC Powder. Stop taking your Multiple Vitamin (MULTIVITAMIN WITH MINERALS) TABS tablet and ANY OVER THE COUNTER supplements until after surgery. You may however, continue to take Tylenol if needed for pain up until the day of surgery.  No Alcohol for 24 hours before or after surgery.  No Smoking including e-cigarettes for 24 hours prior to surgery.  No chewable tobacco products for at least 6 hours prior to surgery.  No nicotine patches on the  day of surgery.  Do not use any "recreational" drugs for at least a week prior to your surgery.  Please be advised that the combination of cocaine and anesthesia may have negative outcomes, up to and including death. If you test positive for cocaine, your surgery will be cancelled.  On the morning of surgery brush your teeth with toothpaste and water, you may rinse your mouth with mouthwash if you wish. Do not swallow any toothpaste or mouthwash.  Use Antibacterial Soap of your choice like Dial on the night prior to the surgery and the morning of your surgery. Please put on clean pajamas and a clean sheet on your bed the night prior to surgery.  Do not wear jewelry.  Do not wear lotions, powders, or cologne.   Do not shave body from the neck down 48 hours prior to surgery just in case you cut yourself which could leave a site for infection.   Contact lenses, hearing aids and dentures may not be worn into surgery.  Do not bring valuables to the hospital. [facility] is not responsible for any missing/lost belongings or valuables.   Notify your doctor if there is any change in your medical condition (cold, fever, infection).  Wear comfortable clothing (specific to your surgery type) to the hospital.  After surgery, you can help prevent lung complications by doing breathing exercises.  Take deep breaths and cough every 1-2 hours.   When coughing or sneezing, hold a pillow firmly against your incision with both hands. This is called "  splinting." Doing this helps protect your incision. It also decreases belly discomfort.  If you are being discharged the day of surgery, you will not be allowed to drive home. You will need a responsible adult (18 years or older) to drive you home and stay with you that night.   If you are taking public transportation, you will need to have a responsible adult (18 years or older) with you. Please confirm with your physician that it is acceptable to use  public transportation.   Please call the [facility] at [phone] if you have any questions about these instructions.  Surgery Visitation Policy:  Patients undergoing a surgery or procedure may have one family member or support person with them as long as that person is not COVID-19 positive or experiencing its symptoms.  That person may remain in the waiting area during the procedure and may rotate out with other people.  Inpatient Visitation:    Visiting hours are 7 a.m. to 8 p.m. Up to two visitors ages 16+ are allowed at one time in a patient room. The visitors may rotate out with other people during the day. Visitors must check out when they leave, or other visitors will not be allowed. One designated support person may remain overnight. The visitor must pass COVID-19 screenings, use hand sanitizer when entering and exiting the patient's room and wear a mask at all times, including in the patient's room. Patients must also wear a mask when staff or their visitor are in the room. Masking is required regardless of vaccination status.

## 2021-06-04 NOTE — ED Notes (Signed)
Pt back from CT

## 2021-06-04 NOTE — Addendum Note (Signed)
Addended by: Caroleen Hamman F on: 06/04/2021 10:05 AM   Modules accepted: Orders

## 2021-06-04 NOTE — ED Triage Notes (Signed)
Pt via EMS from home. Pt has a hx of eosinophilic gastroenteritis. Pt hasn't not had a flair up in years. Pt states that prednisone usually helps it. Pt c/o lower abd pain and NV for the past 2 hours. Pt is A&OX5 and NAD.  EMS gave 200 mg of Fentanyl and 589mL of fluid.

## 2021-06-04 NOTE — ED Notes (Signed)
Pt to CT

## 2021-06-04 NOTE — ED Notes (Signed)
Pt endorses nausea.

## 2021-06-04 NOTE — ED Provider Notes (Signed)
Emergency Medicine Provider Triage Evaluation Note  [name], a 65 y.o. male  was evaluated in triage.  Pt complains of generalized abdominal pain.  Patient gives a history of eosinophilic gastroenteritis, and also endorses to chronic inguinal hernias.  He had a recent contrasted CT pelvis study which confirmed bilateral hernias with concern for possibly infected right inguinal hernia without incarceration.  He does not endorse any inguinal pain at this time, but notes generalized lower abdominal pain consistent with his gastroenteritis history.  He also endorses abdominal pain including nausea and vomiting for the last 2 hours.  Review of Systems  Positive: NV Negative: Hematemesis, hematochezia  Physical Exam  BP (!) 135/101 (BP Location: Right Arm)   Pulse (!) 129   Temp 98.1 F (36.7 C) (Oral)   Resp 20   Ht 5\' 11"  (1.803 m)   Wt 103.9 kg   SpO2 94%   BMI 31.94 kg/m  Gen:   Awake, no distress  uncomfortable Resp:  Normal effort CTA MSK:   Moves extremities without difficulty  Other:  ABD: Soft, distended, and generally tender to palpation to the lower quadrants.  Medical Decision Making  Medically screening exam initiated at 4:39 PM.  Appropriate orders placed.  Rainn Jakhari Space was informed that the remainder of the evaluation will be completed by another provider, this initial triage assessment does not replace that evaluation, and the importance of remaining in the ED until their evaluation is complete.  Patient with a history of usage of gastroenteritis and bilateral inguinal hernias presents to the ED with sudden onset of nausea, vomiting, and abdominal pain that he reports is consistent with his gastroenteritis history.   Melvenia Needles, [name] 06/04/21 1641    Blake Divine, MD 06/04/21 2340

## 2021-06-05 ENCOUNTER — Ambulatory Visit: Payer: 59 | Admitting: Internal Medicine

## 2021-06-05 ENCOUNTER — Other Ambulatory Visit: Payer: No Typology Code available for payment source

## 2021-06-06 ENCOUNTER — Telehealth: Payer: Self-pay

## 2021-06-06 ENCOUNTER — Encounter: Payer: Self-pay | Admitting: Surgery

## 2021-06-06 ENCOUNTER — Ambulatory Visit (INDEPENDENT_AMBULATORY_CARE_PROVIDER_SITE_OTHER): Payer: 59 | Admitting: Surgery

## 2021-06-06 ENCOUNTER — Ambulatory Visit: Payer: 59 | Admitting: Surgery

## 2021-06-06 ENCOUNTER — Other Ambulatory Visit: Payer: Self-pay

## 2021-06-06 ENCOUNTER — Telehealth: Payer: Self-pay | Admitting: Surgery

## 2021-06-06 VITALS — BP 166/94 | HR 132 | Temp 98.2°F | Ht 71.0 in | Wt 219.0 lb

## 2021-06-06 DIAGNOSIS — K432 Incisional hernia without obstruction or gangrene: Secondary | ICD-10-CM | POA: Diagnosis not present

## 2021-06-06 DIAGNOSIS — K4000 Bilateral inguinal hernia, with obstruction, without gangrene, not specified as recurrent: Secondary | ICD-10-CM | POA: Diagnosis not present

## 2021-06-06 DIAGNOSIS — K529 Noninfective gastroenteritis and colitis, unspecified: Secondary | ICD-10-CM

## 2021-06-06 DIAGNOSIS — R1084 Generalized abdominal pain: Secondary | ICD-10-CM

## 2021-06-06 NOTE — Telephone Encounter (Signed)
Surgery for 06/12/21 cancelled per Dr. Dahlia Byes due to heart issues at this time.  Patient is awaiting additional and further testing and will follow up with Dr. Dahlia Byes prior to rescheduling surgery.  Surgery has been cancelled, patient is aware.

## 2021-06-06 NOTE — Patient Instructions (Addendum)
Your CT is scheduled for December 7th, 2022 @ 10:30 am (arrive at 10:15) at [facility]. No food or drinks 4 hours prior to CT. Pick up contrast between now and December 6th.  Please go to [facility] to have your labs drawn. You do not need an appointment.   If you have any concerns or questions, please feel free to call our office. See follow up appointment below.   Inguinal Hernia, Adult An inguinal hernia is when fat or your intestines push through a weak spot in a muscle where your leg meets your lower belly (groin). This causes a bulge. This kind of hernia could also be: In your scrotum, if you are male. In folds of skin around your vagina, if you are male. There are three types of inguinal hernias: Hernias that can be pushed back into the belly (are reducible). This type rarely causes pain. Hernias that cannot be pushed back into the belly (are incarcerated). Hernias that cannot be pushed back into the belly and lose their blood supply (are strangulated). This type needs emergency surgery. What are the causes? This condition is caused by having a weak spot in the muscles or tissues in your groin. This develops over time. The hernia may poke through the weak spot when you strain your lower belly muscles all of a sudden, such as when you: Lift a heavy object. Strain to poop (have a bowel movement). Trouble pooping (constipation) can lead to straining. Cough. What increases the risk? This condition is more likely to develop in: Males. Pregnant females. People who: Are overweight. Work in jobs that require long periods of standing or heavy lifting. Have had an inguinal hernia before. Smoke or have lung disease. These factors can lead to long-term (chronic) coughing. What are the signs or symptoms? Symptoms may depend on the size of the hernia. Often, a small hernia has no symptoms. Symptoms of a larger hernia may include: A bulge in the groin area. This is easier to  see when standing. You might not be able to see it when you are lying down. Pain or burning in the groin. This may get worse when you lift, strain, or cough. A dull ache or a feeling of pressure in the groin. An abnormal bulge in the scrotum, in males. Symptoms of a strangulated inguinal hernia may include: A bulge in your groin that is very painful and tender to the touch. A bulge that turns red or purple. Fever, feeling like you may vomit (nausea), and vomiting. Not being able to poop or to pass gas. How is this treated? Treatment depends on the size of your hernia and whether you have symptoms. If you do not have symptoms, your doctor may have you watch your hernia carefully and have you come in for follow-up visits. If your hernia is large or if you have symptoms, you may need surgery to repair the hernia. Follow these instructions at home: Lifestyle Avoid lifting heavy objects. Avoid standing for long amounts of time. Do not smoke or use any products that contain nicotine or tobacco. If you need help quitting, ask your doctor. Stay at a healthy weight. Prevent trouble pooping You may need to take these actions to prevent or treat trouble pooping: Drink enough fluid to keep your pee (urine) pale yellow. Take over-the-counter or prescription medicines. Eat foods that are high in fiber. These include beans, whole grains, and fresh fruits and vegetables. Limit foods that are high in fat and sugar. These  include fried or sweet foods. General instructions You may try to push your hernia back in place by very gently pressing on it when you are lying down. Do not try to push the bulge back in if it will not go in easily. Watch your hernia for any changes in shape, size, or color. Tell your doctor if you see any changes. Take over-the-counter and prescription medicines only as told by your doctor. Keep all follow-up visits. Contact a doctor if: You have a fever or chills. You have new  symptoms. Your symptoms get worse. Get help right away if: You have pain in your groin that gets worse all of a sudden. You have a bulge in your groin that: Gets bigger all of a sudden, and it does not get smaller after that. Turns red or purple. Is painful when you touch it. You are a male, and you have: Sudden pain in your scrotum. A sudden change in the size of your scrotum. You cannot push the hernia back in place by very gently pressing on it when you are lying down. You feel like you may vomit, and that feeling does not go away. You keep vomiting. You have a fast heartbeat. You cannot poop or pass gas. These symptoms may be an emergency. Get help right away. Call your local emergency services (911 in the [place] Do not wait to see if the symptoms will go away. Do not drive yourself to the hospital. Summary An inguinal hernia is when fat or your intestines push through a weak spot in a muscle where your leg meets your lower belly (groin). This causes a bulge. If you do not have symptoms, you may not need treatment. If you have symptoms or a large hernia, you may need surgery. Avoid lifting heavy objects. Also, avoid standing for long amounts of time. Do not try to push the bulge back in if it will not go in easily. This information is not intended to replace advice given to you by your health care provider. Make sure you discuss any questions you have with your health care provider. Document Revised: 02/22/2020 Document Reviewed: 02/22/2020 Elsevier Patient Education  2022 [employer].

## 2021-06-06 NOTE — Progress Notes (Signed)
Outpatient Surgical Follow Up  06/06/2021  [name] is an 65 y.o. male.   Chief Complaint  Patient presents with   Follow-up    Discuss surgery 06/12/2021    HPI: [name] is a 65 year old male following up after bilateral inguinal hernias and incisional hernias.  I obtain a CT scan and actually he went to the emergency room 2 days ago complaining of abdominal pain.  At that time he was found to have a white count of 30,000 tachycardic and a CT scan was performed and I have personally reviewed it.  There is evidence of incisional hernia as well as a bilateral inguinal hernia and there is chronic bladder within the right hernia sac more importantly there is evidence of thickening of the small bowel.  He reports that Dr. 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  At he recently went to the emergency room and was placed on a steroid and feels much better.  Today in the office he is nontoxic he has been taking p.o. but his heart rate is in the 130s.  Past Medical History:  Diagnosis Date   Acid reflux    Alcohol abuse    Arthritis    "left pinky" (10/17/2014)   DDD (degenerative disc disease), lumbar    Gastroenteritis    History of stress test 2001   no ischemia   Hypertension    Inguinal hernia    Ribs, multiple fractures 12/2018   Tobacco abuse    Vertebral compression fracture (HCC)    T11, L1    Past Surgical History:  Procedure Laterality Date   COLON SURGERY     EXPLORATORY LAPAROTOMY W/ BOWEL RESECTION  1980's    Family History  Problem Relation Age of Onset   Kidney disease Mother    Diabetes Mother    Hypertension Mother    Heart disease Father    Cancer Father    Stroke Father    Heart failure Father    Heart failure Other     Social History:  reports that he has been smoking cigarettes. He has a 13.50 pack-year smoking history. He has never used smokeless tobacco. He reports current  alcohol use of about 7.0 standard drinks per week. He reports that he does not currently use drugs after having used the following drugs: Marijuana.  Allergies:  Allergies  Allergen Reactions   Penicillins Anaphylaxis    Has patient had a PCN reaction causing immediate rash, facial/tongue/throat swelling, SOB or lightheadedness with hypotension: Yes Has patient had a PCN reaction causing severe rash involving mucus membranes or skin necrosis: No Has patient had a PCN reaction that required hospitalization Yes Has patient had a PCN reaction occurring within the last 10 years: No If all of the above answers are "NO", then may proceed with Cephalosporin use.   Morphine And Related Itching    Medications reviewed.    ROS Full ROS performed and is otherwise negative other than what is stated in HPI   BP (!) 166/94   Pulse (!) 132   Temp 98.2 F (36.8 C) (Oral)   Ht 5\' 11"  (1.803 m)   Wt 219 lb (99.3 kg)   SpO2 95%   BMI 30.54 kg/m   Physical Exam Vitals and nursing note reviewed. Exam conducted with a chaperone present.  Constitutional:      General: He is not in acute distress.    Appearance: Normal appearance. He is  normal weight. He is not ill-appearing.  Cardiovascular:     Rate and Rhythm: Regular rhythm. Tachycardia present.     Pulses: Normal pulses.     Heart sounds: No murmur heard. Pulmonary:     Effort: Pulmonary effort is normal. No respiratory distress.     Breath sounds: Normal breath sounds. No stridor. No wheezing.  Abdominal:     General: Abdomen is flat. There is no distension.     Palpations: Abdomen is soft. There is no mass.     Tenderness: There is no abdominal tenderness. There is no guarding or rebound.     Hernia: A hernia is present.     Comments: BIH and incsional ventral hernia reducible  Musculoskeletal:        General: No swelling or tenderness. Normal range of motion.     Cervical back: Normal range of motion and neck supple. No rigidity  or tenderness.  Lymphadenopathy:     Cervical: No cervical adenopathy.  Skin:    General: Skin is warm and dry.     Capillary Refill: Capillary refill takes less than 2 seconds.  Neurological:     General: No focal deficit present.     Mental Status: He is alert and oriented to person, place, and time.  Psychiatric:        Mood and Affect: Mood normal.        Behavior: Behavior normal.        Thought Content: Thought content normal.        Judgment: Judgment normal.       Results for orders placed or performed during the hospital encounter of 06/04/21 (from the past 48 hour(s))  Urinalysis, Routine w reflex microscopic     Status: Abnormal   Collection Time: 06/04/21  9:06 PM  Result Value Ref Range   Color, Urine YELLOW YELLOW   APPearance CLEAR (A) CLEAR   Specific Gravity, Urine >1.030 (H) 1.005 - 1.030   pH 6.0 5.0 - 8.0   Glucose, UA NEGATIVE NEGATIVE mg/dL   Hgb urine dipstick NEGATIVE NEGATIVE   Bilirubin Urine MODERATE (A) NEGATIVE   Ketones, ur NEGATIVE NEGATIVE mg/dL   Protein, ur 100 (A) NEGATIVE mg/dL   Nitrite NEGATIVE NEGATIVE   Leukocytes,Ua NEGATIVE NEGATIVE   RBC / HPF 0-5 0 - 5 RBC/hpf   WBC, UA 0-5 0 - 5 WBC/hpf   Bacteria, UA NONE SEEN NONE SEEN   Squamous Epithelial / LPF NONE SEEN 0 - 5   Mucus PRESENT    Hyaline Casts, UA PRESENT     Comment: Performed at [facility], [address] [place], [place] [address]   CT Abdomen Pelvis W Contrast  Result Date: 06/04/2021 CLINICAL DATA:  Abdominal pain EXAM: CT ABDOMEN AND PELVIS WITH CONTRAST TECHNIQUE: Multidetector CT imaging of the abdomen and pelvis was performed using the standard protocol following bolus administration of intravenous contrast. CONTRAST:  144mL OMNIPAQUE IOHEXOL 300 MG/ML  SOLN COMPARISON:  CT 04/30/2021 FINDINGS: Lower chest: Lung bases demonstrate no acute consolidation or effusion. Normal cardiac size. Hepatobiliary: Hepatic cysts. No calcified gallstone or biliary  dilatation Pancreas: Unremarkable. No pancreatic ductal dilatation or surrounding inflammatory changes. Spleen: Normal in size without focal abnormality. Adrenals/Urinary Tract: Adrenal glands are normal. Cyst in the left kidney. No hydronephrosis. Diffusely thick-walled appearance of the bladder with inflammation. Extension of the right anterior bladder into the inguinal canal. Stomach/Bowel: The stomach is nonenlarged. Thickening of the pyloric region. Thickening of the duodenum. Postsurgical changes  at the jejunum with dilated segment of small bowel at the anastomosis which appears chronic. Multiple loops of thickened small bowel in the left mid abdomen, right lower quadrant and pelvis. Wall thickening of the terminal ileum as well. Prominent submucosal fat within the colon. Negative appendix. Vascular/Lymphatic: Mild aortic atherosclerosis. No aneurysm. No suspicious nodes. Mild mesenteric vascular congestion in the right lower quadrant associated with thickened small bowel. Reproductive: Prostate is unremarkable. Other: Negative for free air. Small slightly dense fluid within the pelvis with small amount of perihepatic and perisplenic ascites. Right inguinal hernia containing inflamed bladder as well as fat. Left inguinal hernia containing only fat at this time and not sigmoid colon. Musculoskeletal: Mild chronic wedging of L1. IMPRESSION: 1. Multiple areas of small bowel wall thickening involving the duodenum, jejunum, and ileum with surrounding inflammatory changes, consistent with enteritis. Small volume perihepatic and perisplenic ascites as well as slightly complex pelvic ascites but no evidence for free air. 2. Thickened appearance of the pylorus is indeterminate for spasm or possible peptic ulcer disease. 3. Thick-walled urinary bladder with inflammation suggestive of cystitis. A portion of the urinary bladder is herniated into the right inguinal canal as noted on previous exams. 4. There are bilateral  inguinal hernias, on the right containing fat, small amount of fluid and urinary bladder. On the left this contains fluid and fat but no bowel at this time. Electronically Signed   By: Donavan Foil M.D.   On: 06/04/2021 22:17    Assessment/Plan: 65 year old male with bilateral inguinal hernia and incisional ventral hernia but in addition he does have significant neutrophilic enteritis.  I would like to request [facility] urgent referral to GI so they can help me to elucidate what is going on with the small bowel.  There is no evidence of collections on the CT scan there is no evidence of perforation and clinically he looks well.  I do think that this is more of a chronic process.  I advised him not to pursue any surgical intervention in the immediate future until we are able to do elucidate his acute needs.  Please note that I spent more than 45 minutes in this encounter including coordination of his care placing orders personally reviewing images and performing appropriate documentation.  Case discussed with Dr. Marius Ditch in detail   Caroleen Hamman, MD [facility]

## 2021-06-06 NOTE — Telephone Encounter (Signed)
Scheduled for dec 5 at 1

## 2021-06-07 ENCOUNTER — Inpatient Hospital Stay: Admission: RE | Admit: 2021-06-07 | Payer: 59 | Source: Ambulatory Visit

## 2021-06-11 ENCOUNTER — Encounter: Payer: Self-pay | Admitting: Internal Medicine

## 2021-06-11 ENCOUNTER — Ambulatory Visit (INDEPENDENT_AMBULATORY_CARE_PROVIDER_SITE_OTHER): Payer: 59 | Admitting: Internal Medicine

## 2021-06-11 ENCOUNTER — Ambulatory Visit (INDEPENDENT_AMBULATORY_CARE_PROVIDER_SITE_OTHER): Payer: 59 | Admitting: Gastroenterology

## 2021-06-11 ENCOUNTER — Encounter: Payer: Self-pay | Admitting: Gastroenterology

## 2021-06-11 ENCOUNTER — Other Ambulatory Visit: Payer: Self-pay

## 2021-06-11 ENCOUNTER — Telehealth: Payer: Self-pay

## 2021-06-11 VITALS — BP 150/80 | HR 102 | Ht 71.0 in | Wt 228.5 lb

## 2021-06-11 VITALS — BP 146/88 | HR 114 | Temp 98.4°F | Ht 71.0 in | Wt 228.4 lb

## 2021-06-11 DIAGNOSIS — Z720 Tobacco use: Secondary | ICD-10-CM | POA: Diagnosis not present

## 2021-06-11 DIAGNOSIS — K219 Gastro-esophageal reflux disease without esophagitis: Secondary | ICD-10-CM | POA: Diagnosis not present

## 2021-06-11 DIAGNOSIS — I10 Essential (primary) hypertension: Secondary | ICD-10-CM

## 2021-06-11 DIAGNOSIS — K639 Disease of intestine, unspecified: Secondary | ICD-10-CM | POA: Diagnosis not present

## 2021-06-11 DIAGNOSIS — D122 Benign neoplasm of ascending colon: Secondary | ICD-10-CM

## 2021-06-11 DIAGNOSIS — K4021 Bilateral inguinal hernia, without obstruction or gangrene, recurrent: Secondary | ICD-10-CM

## 2021-06-11 DIAGNOSIS — Z01818 Encounter for other preprocedural examination: Secondary | ICD-10-CM

## 2021-06-11 DIAGNOSIS — M5136 Other intervertebral disc degeneration, lumbar region: Secondary | ICD-10-CM | POA: Diagnosis not present

## 2021-06-11 DIAGNOSIS — F1010 Alcohol abuse, uncomplicated: Secondary | ICD-10-CM

## 2021-06-11 MED ORDER — NA SULFATE-K SULFATE-MG SULF 17.5-3.13-1.6 GM/177ML PO SOLN: 1.0000 | Freq: Once | ORAL | 0 refills | Status: AC

## 2021-06-11 NOTE — Assessment & Plan Note (Signed)
Stable at the present time. 

## 2021-06-11 NOTE — Assessment & Plan Note (Signed)
Patient was advised to stop drinking alcohol and beer completely

## 2021-06-11 NOTE — Assessment & Plan Note (Signed)
Blood pressure is stable on the present medication 

## 2021-06-11 NOTE — Progress Notes (Signed)
Established Patient Office Visit  Subjective:  Patient ID: [name], male    DOB: Mar 21, 1956  Age: 65 y.o. MRN: [id number]  CC:  Chief Complaint  Patient presents with   Follow-up    Patient is here for a surgery clearance workup.     HPI  [name] presents for surgical  clearance Past Medical History:  Diagnosis Date   Acid reflux    Alcohol abuse    Arthritis    "left pinky" (10/17/2014)   DDD (degenerative disc disease), lumbar    Gastroenteritis    History of stress test 2001   no ischemia   Hypertension    Inguinal hernia    Ribs, multiple fractures 12/2018   Tobacco abuse    Vertebral compression fracture (HCC)    T11, L1    Past Surgical History:  Procedure Laterality Date   COLON SURGERY     EXPLORATORY LAPAROTOMY W/ BOWEL RESECTION  1980's    Family History  Problem Relation Age of Onset   Kidney disease Mother    Diabetes Mother    Hypertension Mother    Heart disease Father    Cancer Father    Stroke Father    Heart failure Father    Heart failure Other     Social History   Socioeconomic History   Marital status: Married    Spouse name: Not on file   Number of children: 3   Years of education: Not on file   Highest education level: Associate degree: occupational, Hotel manager, or vocational program  Occupational History   Occupation: Retired  Tobacco Use   Smoking status: Every Day    Packs/day: 0.50    Years: 27.00    Pack years: 13.50    Types: Cigarettes   Smokeless tobacco: Never  Vaping Use   Vaping Use: Never used  Substance and Sexual Activity   Alcohol use: Yes    Alcohol/week: 7.0 standard drinks    Types: 7 Standard drinks or equivalent per week    Comment: 2020 " SOMETIMES DAILY, SOMETIMES I DONT TOUCH ALCOHOL "   Drug use: Not Currently    Types: Marijuana    Comment: 10/17/2014 "last marijuana was in the 1990's"   Sexual activity: Yes    Birth control/protection: None  Other Topics  Concern   Not on file  Social History Narrative   Not on file   Social Determinants of Health   Financial Resource Strain: Not on file  Food Insecurity: Not on file  Transportation Needs: Not on file  Physical Activity: Not on file  Stress: Not on file  Social Connections: Not on file  Intimate Partner Violence: Not on file     Current Outpatient Medications:    albuterol (VENTOLIN HFA) 108 (90 Base) MCG/ACT inhaler, Inhale 2 puffs into the lungs every 6 (six) hours as needed for wheezing or shortness of breath., Disp: 3 each, Rfl: 1   amLODipine (NORVASC) 5 MG tablet, Take 1 tablet (5 mg total) by mouth daily., Disp: 90 tablet, Rfl: 1   Multiple Vitamin (MULTIVITAMIN WITH MINERALS) TABS tablet, Take 1 tablet by mouth daily., Disp:  , Rfl:    omeprazole (PRILOSEC) 20 MG capsule, Take 20 mg by mouth daily., Disp: , Rfl:    predniSONE (DELTASONE) 20 MG tablet, Take 4 tablets (80 mg total) by mouth daily with breakfast for 7 days, THEN 3 tablets (60 mg total) daily with breakfast for 1 day, THEN 2 tablets (40  mg total) daily with breakfast for 1 day, THEN 1 tablet (20 mg total) daily with breakfast for 1 day., Disp: 34 tablet, Rfl: 0   Allergies  Allergen Reactions   Penicillins Anaphylaxis    Has patient had a PCN reaction causing immediate rash, facial/tongue/throat swelling, SOB or lightheadedness with hypotension: Yes Has patient had a PCN reaction causing severe rash involving mucus membranes or skin necrosis: No Has patient had a PCN reaction that required hospitalization Yes Has patient had a PCN reaction occurring within the last 10 years: No If all of the above answers are "NO", then may proceed with Cephalosporin use.   Morphine And Related Itching    ROS Review of Systems  Constitutional: Negative.   HENT: Negative.    Eyes: Negative.   Respiratory: Negative.    Cardiovascular: Negative.   Gastrointestinal: Negative.   Endocrine: Negative.   Genitourinary:  Negative.   Musculoskeletal: Negative.   Skin: Negative.   Allergic/Immunologic: Negative.   Neurological: Negative.   Hematological: Negative.   Psychiatric/Behavioral: Negative.    All other systems reviewed and are negative.    Objective:    Physical Exam Vitals reviewed.  Constitutional:      Appearance: Normal appearance.  HENT:     Mouth/Throat:     Mouth: Mucous membranes are moist.  Eyes:     Pupils: Pupils are equal, round, and reactive to light.  Neck:     Vascular: No carotid bruit.  Cardiovascular:     Rate and Rhythm: Normal rate and regular rhythm.     Pulses: Normal pulses.     Heart sounds: Normal heart sounds.  Pulmonary:     Effort: Pulmonary effort is normal.     Breath sounds: Normal breath sounds.  Abdominal:     General: Bowel sounds are normal.     Palpations: Abdomen is soft. There is no hepatomegaly, splenomegaly or mass.     Tenderness: There is no abdominal tenderness.     Hernia: No hernia is present.  Musculoskeletal:     Cervical back: Neck supple.     Right lower leg: No edema.     Left lower leg: No edema.  Skin:    Findings: No rash.  Neurological:     Mental Status: He is alert and oriented to person, place, and time.     Motor: No weakness.  Psychiatric:        Mood and Affect: Mood normal.        Behavior: Behavior normal.    BP (!) 150/80   Pulse (!) 102   Ht 5\' 11"  (1.803 m)   Wt 228 lb 8 oz (103.6 kg)   BMI 31.87 kg/m  Wt Readings from Last 3 Encounters:  06/11/21 228 lb 8 oz (103.6 kg)  06/06/21 219 lb (99.3 kg)  06/04/21 229 lb (103.9 kg)     Health Maintenance Due  Topic Date Due   Hepatitis C Screening  Never done    There are no preventive care reminders to display for this patient.  Lab Results  Component Value Date   TSH 1.969 11/24/2020   Lab Results  Component Value Date   WBC 38.1 (H) 06/04/2021   HGB 14.3 06/04/2021   HCT 42.5 06/04/2021   MCV 90.2 06/04/2021   PLT 322 06/04/2021   Lab  Results  Component Value Date   NA 133 (L) 06/04/2021   K 3.0 (L) 06/04/2021   CO2 27 06/04/2021   GLUCOSE 145 (H)  06/04/2021   BUN 8 06/04/2021   CREATININE 0.93 06/04/2021   BILITOT 0.4 06/04/2021   ALKPHOS 75 06/04/2021   AST 20 06/04/2021   ALT 14 06/04/2021   PROT 7.4 06/04/2021   ALBUMIN 4.1 06/04/2021   CALCIUM 8.5 (L) 06/04/2021   ANIONGAP 8 06/04/2021   Lab Results  Component Value Date   CHOL 173 04/08/2018   Lab Results  Component Value Date   HDL 66 04/08/2018   Lab Results  Component Value Date   LDLCALC 92 04/08/2018   Lab Results  Component Value Date   TRIG 74 04/08/2018   Lab Results  Component Value Date   CHOLHDL 2.6 04/08/2018   Lab Results  Component Value Date   HGBA1C 5.4 04/08/2018      Assessment & Plan:   Problem List Items Addressed This Visit       Cardiovascular and Mediastinum   Hypertension    Blood pressure is stable on the present medication        Digestive   Acid reflux    - The patient's GERD is stable on medication.  - Instructed the patient to avoid eating spicy and acidic foods, as well as foods high in fat. - Instructed the patient to avoid eating large meals or meals 2-3 hours prior to sleeping.        Musculoskeletal and Integument   DDD (degenerative disc disease), lumbar    Stable at the present time        Other   Tobacco abuse    - I instructed the patient to stop smoking and provided them with smoking cessation materials.  - I informed the patient that smoking puts them at increased risk for cancer, COPD, hypertension, and more.  - Informed the patient to seek help if they begin to have trouble breathing, develop chest pain, start to cough up blood, feel faint, or pass out.      Alcohol abuse    Patient was advised to stop drinking alcohol and beer completely      Bilateral recurrent inguinal hernia without obstruction or gangrene    Patient is cleared for the bilateral hernia surgery       Other Visit Diagnoses     Preoperative clearance    -  Primary   Relevant Orders   EKG 12-Lead       No orders of the defined types were placed in this encounter. Report of the electrocardiogram.  Electrocardiogram revealed normal sinus rhythm right bundle branch block is noted.  Follow-up: No follow-ups on file.  Patient is approved for the bilateral inguinal hernia surgery  Cletis Athens, MD

## 2021-06-11 NOTE — Assessment & Plan Note (Signed)
-   I instructed the patient to stop smoking and provided them with smoking cessation materials.  - I informed the patient that smoking puts them at increased risk for cancer, COPD, hypertension, and more.  - Informed the patient to seek help if they begin to have trouble breathing, develop chest pain, start to cough up blood, feel faint, or pass out.  

## 2021-06-11 NOTE — Telephone Encounter (Signed)
Received medical clearance from Dr. Lavera Guise. Pt is low risk and is optimized for surgery.

## 2021-06-11 NOTE — Assessment & Plan Note (Signed)
-   The patient's GERD is stable on medication.  - Instructed the patient to avoid eating spicy and acidic foods, as well as foods high in fat. - Instructed the patient to avoid eating large meals or meals 2-3 hours prior to sleeping. 

## 2021-06-11 NOTE — Assessment & Plan Note (Signed)
Patient is cleared for the bilateral hernia surgery

## 2021-06-11 NOTE — Progress Notes (Signed)
Troy Darby, Curtis [address]  [place]  [place], [place] [address]  Main: [phone]  Fax: [phone]    Gastroenterology Consultation  Referring Provider:     Cletis Athens, Curtis Primary Care Physician:  [name] Primary Gastroenterologist:  [name] Reason for Consultation:     ?  Eosinophilic enteritis        HPI:   [name] is a 65 y.o. male referred by Dr. Cletis Athens, Curtis  for consultation & management of possibility of eosinophilic enteritis.  Patient reports that about 20 or 30 years ago he underwent bowel resection by Dr. Johnathan Hausen, colorectal surgeon at [facility] when he presented with abdominal pain and diarrhea.  Patient reports that he has been prednisone dependent during flareups of abdominal pain as he was diagnosed with eosinophilic enteritis.  He states that prednisone is only medication that clears his symptoms.  He has been dependent on prednisone on and off for last 20 years.  Patient had an upper endoscopy 05/2012 which revealed mild eosinophils in the duodenum and esophagus which were nonspecific.  Random colon biopsies were unremarkable.  Patient had 2 ER visits in end of September and end of November 2022 secondary to bilateral inguinal hernia and he thought he had a flareup of eosinophilic gastroenteritis that has not occurred in the last several years.  He reports lower abdominal pain associated with nausea, vomiting and sometimes diarrhea.  He underwent CT abdomen and pelvis which revealed multiple areas of small bowel thickening consistent with enteritis.  Thickened appearance of pylorus as well as bilateral inguinal hernias which did not contain bowel loops.  Labs revealed significant leukocytosis, WBC count 38K, no evidence of peripheral eosinophilia, patient was discharged home on short course of prednisone.  Patient is able to finish the prednisone taper in 2 days.  Patient was also evaluated by  Dr. Dahlia Byes for bilateral inguinal hernia repair as well as umbilical hernia.  He is referred to me for evaluation of possible underlying history of early gastroenteritis before undergoing surgery  Patient currently denies any GI symptoms.  His weight has been stable.  He does acknowledge smoking tobacco as well as drinking alcohol.  No evidence of chronic liver disease.  NSAIDs: None  Antiplts/Anticoagulants/Anti thrombotics: None  GI Procedures:  Normal duodenal biopsies in 9/87 on upper endoscopy  Upper endoscopy 10/17/2005 1. SMALL BOWEL BIOPSY: BENIGN SMALL BOWEL MUCOSA. NO VILLOUS   ATROPHY, INFLAMMATION OR OTHER ABNORMALITIES PRESENT.    2. STOMACH: BENIGN GASTRIC MUCOSA. NO HELICOBACTER PYLORI,   INTESTINAL METAPLASIA OR ACTIVE INFLAMMATION IDENTIFIED. NO   INCREASE IN EOSINOPHILS IDENTIFIED.   Upper endoscopy 05/22/2012 Diagnosis:  Part A: DUODENAL BIOPSIES:  - MILD NON-SPECIFIC DUODENITIS WITH INCREASE IN EOSINOPHILS, SEE  COMMENT.  - NEGATIVE FOR DYSPLASIA AND MALIGNANCY.  .  Part B: STOMACH BIOPSIES:  - ANTRAL AND OXYNTIC MUCOSA WITH MILD CHRONIC GASTRITIS.  - NEGATIVE FOR H.PYLORI, DYSPLASIA AND MALIGNANCY.  .  Part C: ESOPHAGUS BIOPSIES:  - SQUAMOUS MUCOSA WITH FEATURES SUGGESTIVE OF REFLUX (UP TO 6  EOSINOPHILS IN A HIGH POWER FIELD).  - NEGATIVE FOR DYSPLASIA AND MALIGNANCY.  Colonoscopy 05/26/2012 Diagnosis:  Part A: ASCENDING COLON POLYP COLD BIOPSY X 2:  - TUBULAR ADENOMA (1).  - COLONIC MUCOSA WITH PROMINENT LYMPHOID AGGREGATE (1).  - NEGATIVE FOR HIGH GRADE DYSPLASIA AND MALIGNANCY.  .  Part B: TRANSVERSE COLON COLD BIOPSY:  - COLONIC MUCOSA WITH NO PATHOLOGIC ABNORMALITY.  -  NEGATIVE FOR DYSPLASIA AND MALIGNANCY.  .  Part C: DESCENDING COLON COLD BIOPSY:  - COLONIC MUCOSA WITH NO PATHOLOGIC ABNORMALITY.  - NEGATIVE FOR DYSPLASIA AND MALIGNANCY.   Past Medical History:  Diagnosis Date   Acid reflux    Alcohol abuse    Arthritis    "left pinky"  (10/17/2014)   DDD (degenerative disc disease), lumbar    Gastroenteritis    History of stress test 2001   no ischemia   Hypertension    Inguinal hernia    Ribs, multiple fractures 12/2018   Tobacco abuse    Vertebral compression fracture (HCC)    T11, L1    Past Surgical History:  Procedure Laterality Date   COLON SURGERY     EXPLORATORY LAPAROTOMY W/ BOWEL RESECTION  1980's    Current Outpatient Medications:    albuterol (VENTOLIN HFA) 108 (90 Base) MCG/ACT inhaler, Inhale 2 puffs into the lungs every 6 (six) hours as needed for wheezing or shortness of breath., Disp: 3 each, Rfl: 1   amLODipine (NORVASC) 5 MG tablet, Take 1 tablet (5 mg total) by mouth daily., Disp: 90 tablet, Rfl: 1   Multiple Vitamin (MULTIVITAMIN WITH MINERALS) TABS tablet, Take 1 tablet by mouth daily., Disp:  , Rfl:    Na Sulfate-K Sulfate-Mg Sulf 17.5-3.13-1.6 GM/177ML SOLN, Take 1 kit by mouth once for 1 dose., Disp: 354 mL, Rfl: 0   omeprazole (PRILOSEC) 20 MG capsule, Take 20 mg by mouth daily., Disp: , Rfl:    predniSONE (DELTASONE) 20 MG tablet, Take 4 tablets (80 mg total) by mouth daily with breakfast for 7 days, THEN 3 tablets (60 mg total) daily with breakfast for 1 day, THEN 2 tablets (40 mg total) daily with breakfast for 1 day, THEN 1 tablet (20 mg total) daily with breakfast for 1 day., Disp: 34 tablet, Rfl: 0    Family History  Problem Relation Age of Onset   Kidney disease Mother    Diabetes Mother    Hypertension Mother    Heart disease Father    Cancer Father    Stroke Father    Heart failure Father    Heart failure Other      Social History   Tobacco Use   Smoking status: Every Day    Packs/day: 0.50    Years: 27.00    Pack years: 13.50    Types: Cigarettes   Smokeless tobacco: Never  Vaping Use   Vaping Use: Never used  Substance Use Topics   Alcohol use: Yes    Alcohol/week: 7.0 standard drinks    Types: 7 Standard drinks or equivalent per week    Comment: 2020 "  SOMETIMES DAILY, SOMETIMES I DONT TOUCH ALCOHOL "   Drug use: Not Currently    Types: Marijuana    Comment: 10/17/2014 "last marijuana was in the 1990's"    Allergies as of 06/11/2021 - Review Complete 06/11/2021  Allergen Reaction Noted   Penicillins Anaphylaxis 06/12/2011   Morphine and related Itching 02/24/2016    Review of Systems:    All systems reviewed and negative except where noted in HPI.   Physical Exam:  BP (!) 146/88 (BP Location: Left Arm, Patient Position: Sitting, Cuff Size: Normal)   Pulse (!) 114   Temp 98.4 F (36.9 C) (Oral)   Ht 5' 11"  (1.803 m)   Wt 228 lb 6.4 oz (103.6 kg)   BMI 31.86 kg/m  No LMP for male patient.  General:   Alert,  Well-developed,  well-nourished, pleasant and cooperative in NAD Head:  Normocephalic and atraumatic. Eyes:  Sclera clear, no icterus.   Conjunctiva pink. Ears:  Normal auditory acuity. Nose:  No deformity, discharge, or lesions. Mouth:  No deformity or lesions,oropharynx pink & moist. Neck:  Supple; no masses or thyromegaly. Lungs:  Respirations even and unlabored.  Clear throughout to auscultation.   No wheezes, crackles, or rhonchi. No acute distress. Heart:  Regular rate and rhythm; no murmurs, clicks, rubs, or gallops. Abdomen:  Normal bowel sounds. Soft, midline vertical scar, nontender, obese abdomen, umbilical hernia, nontender, bilateral inguinal hernias, nontender and non-distended without masses, hepatosplenomegaly.  No guarding or rebound tenderness.   Rectal: Not performed Msk:  Symmetrical without gross deformities. Good, equal movement & strength bilaterally. Pulses:  Normal pulses noted. Extremities:  No clubbing or edema.  No cyanosis. Neurologic:  Alert and oriented x3;  grossly normal neurologically. Skin:  Intact without significant lesions or rashes. No jaundice. Psych:  Alert and cooperative. Normal mood and affect.  Imaging Studies: Reviewed  Assessment and Plan:   [name] is a  65 y.o. pleasant African-American male with BMI 31.86 is seen in consultation for further evaluation of possible eosinophilic gastroenteritis.  Patient has history of bowel resection about 20 to 30 years ago for abdominal pain.  He has bilateral inguinal hernias and umbilical hernia  ?  Eosinophilic gastroenteritis Previous pathology reports did not confirm the diagnosis, the pathology reports were nonspecific due to presence of mild eosinophils in duodenum and esophagus Discussed with patient that I do not recommend prednisone without confirmed diagnosis due to long-term side effects which patient realizes that he is already experiencing If he truly has underlying history of lung gastroenteritis, he will warrant further work-up to evaluate for peripheral eosinophilia which he does not have at present and also need to rule out parasitic infections Recommend upper endoscopy with esophageal, gastric and duodenal biopsies  Personal history of colon adenomas in 2013 Recommend colonoscopy with TI evaluation His bilateral inguinal hernias contain only soft tissue, there was no evidence of bowel on the CT scan Therefore, it is reasonable to proceed with colonoscopy before undergoing repair of inguinal hernias    Follow up based on the above work-up   Troy Darby, Curtis

## 2021-06-12 ENCOUNTER — Ambulatory Visit
Admission: RE | Admit: 2021-06-12 | Discharge: 2021-06-12 | Disposition: A | Discharge status: Home / Self Care | Payer: 59 | Source: Ambulatory Visit | Attending: Surgery | Admitting: Surgery

## 2021-06-12 ENCOUNTER — Encounter: Admission: RE | Payer: Self-pay | Source: Home / Self Care

## 2021-06-12 ENCOUNTER — Ambulatory Visit: Admission: RE | Admit: 2021-06-12 | Payer: 59 | Source: Home / Self Care | Admitting: Surgery

## 2021-06-12 DIAGNOSIS — R1084 Generalized abdominal pain: Secondary | ICD-10-CM | POA: Insufficient documentation

## 2021-06-12 SURGERY — REPAIR, HERNIA, INGUINAL, BILATERAL, ROBOT-ASSISTED
Anesthesia: General | Laterality: Bilateral

## 2021-06-12 MED ORDER — IOHEXOL 350 MG/ML SOLN
100.0000 mL | Freq: Once | INTRAVENOUS | Status: AC | PRN
  Administered 2021-06-12: 100 mL via INTRAVENOUS

## 2021-06-13 ENCOUNTER — Ambulatory Visit: Admission: RE | Admit: 2021-06-13 | Payer: 59 | Source: Ambulatory Visit

## 2021-06-13 ENCOUNTER — Telehealth: Payer: Self-pay

## 2021-06-13 NOTE — Telephone Encounter (Signed)
Left message for patient to return call to office- CT shows enteritis (inflammation of the bowel) is better keep scheduled appointment 06/25/2021 with Dr.Pabon @ 2:15.

## 2021-06-15 NOTE — ED Provider Notes (Signed)
[facility] Emergency Department Provider Note   ____________________________________________   Event Date/Time   First MD Initiated Contact with Patient 06/04/21 2128     (approximate)  I have reviewed the triage vital signs and the nursing notes.   HISTORY  Chief Complaint Abdominal Pain    HPI Amarri Nicandro Perrault is a 65 y.o. 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  Patient states that prednisone has worked for similar pain in the past MODIFYING FACTORS: Denies any exacerbating or relieving factors ASSOCIATED SYMPTOMS: Nausea/vomiting   Per medical record review, patient has history of recurrent gastroenteritis          Past Medical History:  Diagnosis Date   Acid reflux    Alcohol abuse    Arthritis    "left pinky" (10/17/2014)   DDD (degenerative disc disease), lumbar    Gastroenteritis    History of stress test 2001   no ischemia   Hypertension    Inguinal hernia    Ribs, multiple fractures 12/2018   Tobacco abuse    Vertebral compression fracture (HCC)    T11, L1    Patient Active Problem List   Diagnosis Date Noted   Multiple rib fractures 12/28/2018   Rib fractures 12/25/2018   Acute CVA (cerebrovascular accident) ([place]) 04/08/2018   TIA (transient ischemic attack) 08/22/2017   Bilateral recurrent inguinal hernia without obstruction or gangrene    Influenza 10/18/2014   Hypoxia 10/16/2014   Acid reflux    DDD (degenerative disc disease), lumbar    Hypertension    Tobacco abuse    Alcohol abuse    Essential hypertension     Past Surgical History:  Procedure Laterality Date   COLON SURGERY      EXPLORATORY LAPAROTOMY W/ BOWEL RESECTION  1980's    Prior to Admission medications   Medication Sig Start Date End Date Taking? Authorizing Provider  albuterol (VENTOLIN HFA) 108 (90 Base) MCG/ACT inhaler Inhale 2 puffs into the lungs every 6 (six) hours as needed for wheezing or shortness of breath. 05/29/21   Cletis Athens, MD  amLODipine (NORVASC) 5 MG tablet Take 1 tablet (5 mg total) by mouth daily. 05/29/21 05/29/22  Cletis Athens, MD  Multiple Vitamin (MULTIVITAMIN WITH MINERALS) TABS tablet Take 1 tablet by mouth daily. 12/29/18   Meuth, Brooke A, [name]  omeprazole (PRILOSEC) 20 MG capsule Take 20 mg by mouth daily.    [provider]    Allergies Penicillins and Morphine and related  Family History  Problem Relation Age of Onset   Kidney disease Mother    Diabetes Mother    Hypertension Mother    Heart disease Father    Cancer Father    Stroke Father    Heart failure Father    Heart failure Other     Social History Social History   Tobacco Use   Smoking status: Every Day    Packs/day: 0.50    Years: 27.00    Pack years: 13.50    Types: Cigarettes   Smokeless tobacco: Never  Vaping Use   Vaping Use: Never used  Substance Use Topics   Alcohol use: Yes    Alcohol/week: 7.0 standard drinks  Types: 7 Standard drinks or equivalent per week    Comment: 2020 " SOMETIMES DAILY, SOMETIMES I DONT TOUCH ALCOHOL "   Drug use: Not Currently    Types: Marijuana    Comment: 10/17/2014 "last marijuana was in the 1990's"    Review of Systems Constitutional: No fever/chills Eyes: No visual changes. ENT: No sore throat. Cardiovascular: Denies chest pain. Respiratory: Denies shortness of breath. Gastrointestinal: Endorses generalized abdominal pain, nausea, and vomiting.  No diarrhea. Genitourinary: Negative for dysuria. Musculoskeletal: Negative for acute arthralgias Skin: Negative for rash. Neurological: Negative for headaches,  weakness/numbness/paresthesias in any extremity Psychiatric: Negative for suicidal ideation/homicidal ideation   ____________________________________________   PHYSICAL EXAM:  VITAL SIGNS: ED Triage Vitals  Enc Vitals Group     BP 06/04/21 1636 (!) 135/101     Pulse Rate 06/04/21 1636 (!) 129     Resp 06/04/21 1636 20     Temp 06/04/21 1636 98.1 F (36.7 C)     Temp Source 06/04/21 1636 Oral     SpO2 06/04/21 1636 94 %     Weight 06/04/21 1637 229 lb (103.9 kg)     Height 06/04/21 1637 5\' 11"  (1.803 m)     Head Circumference --      Peak Flow --      Pain Score 06/04/21 1636 10     Pain Loc --      Pain Edu? --      Excl. in [place]? --    Constitutional: Alert and oriented. Well appearing and in no acute distress. Eyes: Conjunctivae are normal. PERRL. Head: Atraumatic. Nose: No congestion/rhinnorhea. Mouth/Throat: Mucous membranes are moist. Neck: No stridor Cardiovascular: Grossly normal heart sounds.  Good peripheral circulation. Respiratory: Normal respiratory effort.  No retractions. Gastrointestinal: Soft and tender to palpation in bilateral lower quadrants. No distention. Musculoskeletal: No obvious deformities Neurologic:  Normal speech and language. No gross focal neurologic deficits are appreciated. Skin:  Skin is warm and dry. No rash noted. Psychiatric: Mood and affect are normal. Speech and behavior are normal.  ____________________________________________   LABS (all labs ordered are listed, but only abnormal results are displayed)  Labs Reviewed  COMPREHENSIVE METABOLIC PANEL - Abnormal; Notable for the following components:      Result Value   Sodium 133 (*)    Potassium 3.0 (*)    Glucose, Bld 145 (*)    Calcium 8.5 (*)    All other components within normal limits  CBC - Abnormal; Notable for the following components:   WBC 38.1 (*)    RDW 19.1 (*)    All other components within normal limits  URINALYSIS, ROUTINE W REFLEX MICROSCOPIC - Abnormal;  Notable for the following components:   APPearance CLEAR (*)    Specific Gravity, Urine >1.030 (*)    Bilirubin Urine MODERATE (*)    Protein, ur 100 (*)    All other components within normal limits  LIPASE, BLOOD   ____________________________________________ RADIOLOGY  ED MD interpretation: CT of the abdomen and pelvis with IV contrast shows thickening of multiple areas of small bowel consistent with enteritis.  There is also swelling of the pylorus consistent with possible peptic ulcer disease  Official radiology report(s): No results found.  ____________________________________________   PROCEDURES  Procedure(s) performed (including Critical Care):  Procedures   ____________________________________________   INITIAL IMPRESSION / ASSESSMENT AND PLAN / ED COURSE  As part of my medical decision making, I reviewed the following data within the electronic medical record, if available:  Nursing notes reviewed and incorporated, Labs reviewed, EKG interpreted, Old chart reviewed, Radiograph reviewed and Notes from prior ED visits reviewed and incorporated      Patient presents for acute nausea/vomiting and abdominal pain The cause of the patients symptoms is not clear, but the patient is overall well appearing and is suspected to have a transient course of illness.  Given History and Exam there does not appear to be an emergent cause of the symptoms such as small bowel obstruction, coronary syndrome, bowel ischemia, DKA, pancreatitis, appendicitis, other acute abdomen or other emergent problem.  Reassessment: After treatment, the patient is feeling much better, tolerating PO fluids, and shows no signs of dehydration.  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 Strict return precautions discussed.       ____________________________________________   FINAL CLINICAL IMPRESSION(S) / ED DIAGNOSES  Final diagnoses:  Enteritis  Generalized abdominal pain     ED Discharge Orders          Ordered    predniSONE (DELTASONE) 20 MG tablet        06/04/21 2238    HYDROcodone-acetaminophen (NORCO/VICODIN) 5-325 MG tablet  Every 4 hours PRN,   Status:  Discontinued        06/04/21 2238             Note:  This document was prepared using Dragon voice recognition software and may include unintentional dictation errors.    Naaman Plummer, MD 06/15/21 [phone]

## 2021-06-21 ENCOUNTER — Encounter: Payer: Self-pay | Admitting: Gastroenterology

## 2021-06-25 ENCOUNTER — Ambulatory Visit (INDEPENDENT_AMBULATORY_CARE_PROVIDER_SITE_OTHER): Payer: 59 | Admitting: Surgery

## 2021-06-25 ENCOUNTER — Other Ambulatory Visit: Payer: Self-pay

## 2021-06-25 ENCOUNTER — Telehealth: Payer: Self-pay | Admitting: Surgery

## 2021-06-25 VITALS — BP 112/90 | HR 111 | Temp 98.4°F | Ht 71.0 in | Wt 217.0 lb

## 2021-06-25 DIAGNOSIS — K4020 Bilateral inguinal hernia, without obstruction or gangrene, not specified as recurrent: Secondary | ICD-10-CM

## 2021-06-25 DIAGNOSIS — K432 Incisional hernia without obstruction or gangrene: Secondary | ICD-10-CM

## 2021-06-25 NOTE — Patient Instructions (Signed)
You have chose to have your hernia repaired. This will be done by Dr. Dahlia Byes at [facility]  Please see your (blue) Pre-care information that you have been given today. Our surgery schedule will call you to look at surgery dates and to go over information.   You will need to arrange to be out of work for 2 weeks and then return with a lifting restrictions for 4 more weeks. Please send any FMLA paperwork prior to surgery and we will fill this out and fax it back to your employer within 3 business days.  You may have a bruise in your groin and also swelling and brusing in your testicle area. You may use ice 4-5 times daily for 15-20 minutes each time. Make sure that you place a barrier between you and the ice pack. To decrease the swelling, you may roll up a bath towel and place it vertically in between your thighs with your testicles resting on the towel. You will want to keep this area elevated as much as possible for several days following surgery.    Inguinal Hernia, Adult Muscles help keep everything in the body in its proper place. But if a weak spot in the muscles develops, something can poke through. That is called a hernia. When this happens in the lower part of the belly (abdomen), it is called an inguinal hernia. (It takes its name from a part of the body in this region called the inguinal canal.) A weak spot in the wall of muscles lets some fat or part of the small intestine bulge through. An inguinal hernia can develop at any age. Men get them more often than women. CAUSES  In adults, an inguinal hernia develops over time. It can be triggered by: Suddenly straining the muscles of the lower abdomen. Lifting heavy objects. Straining to have a bowel movement. Difficult bowel movements (constipation) can lead to this. Constant coughing. This may be caused by smoking or lung disease. Being overweight. Being pregnant. Working at a job that requires long periods of standing or heavy  lifting. Having had an inguinal hernia before. One type can be an emergency situation. It is called a strangulated inguinal hernia. It develops if part of the small intestine slips through the weak spot and cannot get back into the abdomen. The blood supply can be cut off. If that happens, part of the intestine may die. This situation requires emergency surgery. SYMPTOMS  Often, a small inguinal hernia has no symptoms. It is found when a healthcare provider does a physical exam. Larger hernias usually have symptoms.  In adults, symptoms may include: A lump in the groin. This is easier to see when the person is standing. It might disappear when lying down. In men, a lump in the scrotum. Pain or burning in the groin. This occurs especially when lifting, straining or coughing. A dull ache or feeling of pressure in the groin. Signs of a strangulated hernia can include: A bulge in the groin that becomes very painful and tender to the touch. A bulge that turns red or purple. Fever, nausea and vomiting. Inability to have a bowel movement or to pass gas. DIAGNOSIS  To decide if you have an inguinal hernia, a healthcare provider will probably do a physical examination. This will include asking questions about any symptoms you have noticed. The healthcare provider might feel the groin area and ask you to cough. If an inguinal hernia is felt, the healthcare provider may try to slide it back  into the abdomen. Usually no other tests are needed. TREATMENT  Treatments can vary. The size of the hernia makes a difference. Options include: Watchful waiting. This is often suggested if the hernia is small and you have had no symptoms. No medical procedure will be done unless symptoms develop. You will need to watch closely for symptoms. If any occur, contact your healthcare provider right away. Surgery. This is used if the hernia is larger or you have symptoms. Open surgery. This is usually an outpatient  procedure (you will not stay overnight in a hospital). An cut (incision) is made through the skin in the groin. The hernia is put back inside the abdomen. The weak area in the muscles is then repaired by herniorrhaphy or hernioplasty. Herniorrhaphy: in this type of surgery, the weak muscles are sewn back together. Hernioplasty: a patch or mesh is used to close the weak area in the abdominal wall. Laparoscopy. In this procedure, a surgeon makes small incisions. A thin tube with a tiny video camera (called a laparoscope) is put into the abdomen. The surgeon repairs the hernia with mesh by looking with the video camera and using two long instruments. HOME CARE INSTRUCTIONS  After surgery to repair an inguinal hernia: You will need to take pain medicine prescribed by your healthcare provider. Follow all directions carefully. You will need to take care of the wound from the incision. Your activity will be restricted for awhile. This will probably include no heavy lifting for several weeks. You also should not do anything too active for a few weeks. When you can return to work will depend on the type of job that you have. During "watchful waiting" periods, you should: Maintain a healthy weight. Eat a diet high in fiber (fruits, vegetables and whole grains). Drink plenty of fluids to avoid constipation. This means drinking enough water and other liquids to keep your urine clear or pale yellow. Do not lift heavy objects. Do not stand for long periods of time. Quit smoking. This should keep you from developing a frequent cough. SEEK MEDICAL CARE IF:  A bulge develops in your groin area. You feel pain, a burning sensation or pressure in the groin. This might be worse if you are lifting or straining. You develop a fever of more than 100.5 F (38.1 C). SEEK IMMEDIATE MEDICAL CARE IF:  Pain in the groin increases suddenly. A bulge in the groin gets bigger suddenly and does not go down. For men, there is  sudden pain in the scrotum. Or, the size of the scrotum increases. A bulge in the groin area becomes red or purple and is painful to touch. You have nausea or vomiting that does not go away. You feel your heart beating much faster than normal. You cannot have a bowel movement or pass gas. You develop a fever of more than 102.0 F (38.9 C).   This information is not intended to replace advice given to you by your health care provider. Make sure you discuss any questions you have with your health care provider.   Document Released: 11/10/2008 Document Revised: 09/16/2011 Document Reviewed: 12/26/2014 Elsevier Interactive Patient Education [employer].

## 2021-06-25 NOTE — Telephone Encounter (Signed)
Patient has been advised of Pre-Admission date/time, COVID Testing date and Surgery date.  Surgery Date: 07/05/21 Preadmission Testing Date: 06/27/21 (phone 1p-5p) Covid Testing Date: Not needed.   Patient has been made aware to call [phone], between 1-3:00pm the day before surgery, to find out what time to arrive for surgery.

## 2021-06-27 ENCOUNTER — Encounter
Admission: RE | Admit: 2021-06-27 | Discharge: 2021-06-27 | Disposition: A | Discharge status: Home / Self Care | Payer: 59 | Source: Ambulatory Visit | Attending: Surgery | Admitting: Surgery

## 2021-06-27 ENCOUNTER — Other Ambulatory Visit: Payer: Self-pay

## 2021-06-27 VITALS — Ht 71.0 in | Wt 225.0 lb

## 2021-06-27 DIAGNOSIS — Z01812 Encounter for preprocedural laboratory examination: Secondary | ICD-10-CM

## 2021-06-27 DIAGNOSIS — I10 Essential (primary) hypertension: Secondary | ICD-10-CM

## 2021-06-27 DIAGNOSIS — G459 Transient cerebral ischemic attack, unspecified: Secondary | ICD-10-CM

## 2021-06-27 NOTE — H&P (View-Only) (Signed)
Outpatient Surgical Follow Up  06/27/2021  [name] is an 65 y.o. male.   Chief Complaint  Patient presents with   Follow-up    Discuss sx     HPI:  [name] is a 65 year old male following up after bilateral inguinal hernias and incisional hernias.  HE did havbe episode of enteritis, Recent CT.  There is evidence of incisional hernia as well as a bilateral inguinal hernia and there is chronic bladder within the right hernia sac more importantly there is evidence of thickening of the small bowel.  He reports that Dr. 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  At he recently went to the emergency room and was placed on a steroid and feels much better.  Today in the office he is nontoxicand GI sxs have improved. He is off steroids and tachycardia has improved. He did see Dr. Marius Ditch. No abd pains, no fevers or chills  Past Medical History:  Diagnosis Date   Acid reflux    Alcohol abuse    Arthritis    "left pinky" (10/17/2014)   COPD (chronic obstructive pulmonary disease) (HCC)    DDD (degenerative disc disease), lumbar    Gastroenteritis    History of stress test 2001   no ischemia   Hypertension    Inguinal hernia    Ribs, multiple fractures 12/2018   TIA (transient ischemic attack) 08/22/2017   (also 04/08/18)  No deficits   Tobacco abuse    Vertebral compression fracture (HCC)    T11, L1    Past Surgical History:  Procedure Laterality Date   COLON SURGERY     EXPLORATORY LAPAROTOMY W/ BOWEL RESECTION  1980's    Family History  Problem Relation Age of Onset   Kidney disease Mother    Diabetes Mother    Hypertension Mother    Heart disease Father    Cancer Father    Stroke Father    Heart failure Father    Heart failure Other     Social History:  reports that he has been smoking cigarettes. He has a 13.50 pack-year smoking history. He has never used smokeless tobacco. He reports current alcohol  use of about 7.0 standard drinks per week. He reports that he does not currently use drugs after having used the following drugs: Marijuana.  Allergies:  Allergies  Allergen Reactions   Penicillins Anaphylaxis    Has patient had a PCN reaction causing immediate rash, facial/tongue/throat swelling, SOB or lightheadedness with hypotension: Yes Has patient had a PCN reaction causing severe rash involving mucus membranes or skin necrosis: No Has patient had a PCN reaction that required hospitalization Yes Has patient had a PCN reaction occurring within the last 10 years: No If all of the above answers are "NO", then may proceed with Cephalosporin use.   Morphine And Related Itching    Medications reviewed.  ROS Full ROS performed and is otherwise negative other than what is stated in HPI   BP 112/90    Pulse (!) 111    Temp 98.4 F (36.9 C)    Ht 5\' 11"  (1.803 m)    Wt 217 lb (98.4 kg)    SpO2 97%    BMI 30.27 kg/m   Physical Exam Physical Exam Vitals and nursing note reviewed. Exam conducted with a chaperone present.  Constitutional:      General: He is not in acute distress.    Appearance: Normal appearance. He is  normal weight. He is not ill-appearing.  Cardiovascular:     Rate and Rhythm: Regular rhythm.     Pulses: Normal pulses.     Heart sounds: No murmur heard. Pulmonary:     Effort: Pulmonary effort is normal. No respiratory distress.     Breath sounds: Normal breath sounds. No stridor. No wheezing.  Abdominal:     General: Abdomen is flat. There is no distension.     Palpations: Abdomen is soft. There is no mass.     Tenderness: There is no abdominal tenderness. There is no guarding or rebound.     Hernia: A hernia is present.     Comments: BIH and incsional ventral hernia reducible  Musculoskeletal:        General: No swelling or tenderness. Normal range of motion.     Cervical back: Normal range of motion and neck supple. No rigidity or tenderness.   Lymphadenopathy:     Cervical: No cervical adenopathy.  Skin:    General: Skin is warm and dry.     Capillary Refill: Capillary refill takes less than 2 seconds.  Neurological:     General: No focal deficit present.     Mental Status: He is alert and oriented to person, place, and time.  Psychiatric:        Mood and Affect: Mood normal.        Behavior: Behavior normal.        Thought Content: Thought content normal.        Judgment: Judgment normal   Assessment/Plan: 65 year old male with bilateral inguinal hernia and incisional ventral hernia but in addition he does have significant neutrophilic enteritis.   Episode of enteritis now controlled and no active symptoms.  I do think that is reasonable to proceed with elective bilateral inguinal hernia repair incisional hernia repair.  Procedure discussed with the patient in detail.  Risks, benefits and possible complications including but not limited to: Bleeding, infection injury to adjacent structures and recurrence as well as chronic pain.  He understands and wishes to proceed    Please note that I spent more than 45 minutes in this encounter including coordination of his care placing orders personally reviewing images and performing appropriate documentation.  Case discussed with Dr. Marius Ditch in [facility], MD [facility] General Surgeon

## 2021-06-27 NOTE — Progress Notes (Signed)
Outpatient Surgical Follow Up  06/27/2021  [name] is an 65 y.o. male.   Chief Complaint  Patient presents with   Follow-up    Discuss sx     HPI:  [name] is a 65 year old male following up after bilateral inguinal hernias and incisional hernias.  HE did havbe episode of enteritis, Recent CT.  There is evidence of incisional hernia as well as a bilateral inguinal hernia and there is chronic bladder within the right hernia sac more importantly there is evidence of thickening of the small bowel.  He reports that Dr. 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  At he recently went to the emergency room and was placed on a steroid and feels much better.  Today in the office he is nontoxicand GI sxs have improved. He is off steroids and tachycardia has improved. He did see Dr. Marius Ditch. No abd pains, no fevers or chills  Past Medical History:  Diagnosis Date   Acid reflux    Alcohol abuse    Arthritis    "left pinky" (10/17/2014)   COPD (chronic obstructive pulmonary disease) (HCC)    DDD (degenerative disc disease), lumbar    Gastroenteritis    History of stress test 2001   no ischemia   Hypertension    Inguinal hernia    Ribs, multiple fractures 12/2018   TIA (transient ischemic attack) 08/22/2017   (also 04/08/18)  No deficits   Tobacco abuse    Vertebral compression fracture (HCC)    T11, L1    Past Surgical History:  Procedure Laterality Date   COLON SURGERY     EXPLORATORY LAPAROTOMY W/ BOWEL RESECTION  1980's    Family History  Problem Relation Age of Onset   Kidney disease Mother    Diabetes Mother    Hypertension Mother    Heart disease Father    Cancer Father    Stroke Father    Heart failure Father    Heart failure Other     Social History:  reports that he has been smoking cigarettes. He has a 13.50 pack-year smoking history. He has never used smokeless tobacco. He reports current alcohol  use of about 7.0 standard drinks per week. He reports that he does not currently use drugs after having used the following drugs: Marijuana.  Allergies:  Allergies  Allergen Reactions   Penicillins Anaphylaxis    Has patient had a PCN reaction causing immediate rash, facial/tongue/throat swelling, SOB or lightheadedness with hypotension: Yes Has patient had a PCN reaction causing severe rash involving mucus membranes or skin necrosis: No Has patient had a PCN reaction that required hospitalization Yes Has patient had a PCN reaction occurring within the last 10 years: No If all of the above answers are "NO", then may proceed with Cephalosporin use.   Morphine And Related Itching    Medications reviewed.  ROS Full ROS performed and is otherwise negative other than what is stated in HPI   BP 112/90    Pulse (!) 111    Temp 98.4 F (36.9 C)    Ht 5\' 11"  (1.803 m)    Wt 217 lb (98.4 kg)    SpO2 97%    BMI 30.27 kg/m   Physical Exam Physical Exam Vitals and nursing note reviewed. Exam conducted with a chaperone present.  Constitutional:      General: He is not in acute distress.    Appearance: Normal appearance. He is  normal weight. He is not ill-appearing.  Cardiovascular:     Rate and Rhythm: Regular rhythm.     Pulses: Normal pulses.     Heart sounds: No murmur heard. Pulmonary:     Effort: Pulmonary effort is normal. No respiratory distress.     Breath sounds: Normal breath sounds. No stridor. No wheezing.  Abdominal:     General: Abdomen is flat. There is no distension.     Palpations: Abdomen is soft. There is no mass.     Tenderness: There is no abdominal tenderness. There is no guarding or rebound.     Hernia: A hernia is present.     Comments: BIH and incsional ventral hernia reducible  Musculoskeletal:        General: No swelling or tenderness. Normal range of motion.     Cervical back: Normal range of motion and neck supple. No rigidity or tenderness.   Lymphadenopathy:     Cervical: No cervical adenopathy.  Skin:    General: Skin is warm and dry.     Capillary Refill: Capillary refill takes less than 2 seconds.  Neurological:     General: No focal deficit present.     Mental Status: He is alert and oriented to person, place, and time.  Psychiatric:        Mood and Affect: Mood normal.        Behavior: Behavior normal.        Thought Content: Thought content normal.        Judgment: Judgment normal   Assessment/Plan: 65 year old male with bilateral inguinal hernia and incisional ventral hernia but in addition he does have significant neutrophilic enteritis.   Episode of enteritis now controlled and no active symptoms.  I do think that is reasonable to proceed with elective bilateral inguinal hernia repair incisional hernia repair.  Procedure discussed with the patient in detail.  Risks, benefits and possible complications including but not limited to: Bleeding, infection injury to adjacent structures and recurrence as well as chronic pain.  He understands and wishes to proceed    Please note that I spent more than 45 minutes in this encounter including coordination of his care placing orders personally reviewing images and performing appropriate documentation.  Case discussed with Dr. Marius Ditch in [facility], MD [facility] General Surgeon

## 2021-06-27 NOTE — Patient Instructions (Addendum)
Your procedure is scheduled on: Thursday, December 29 Report to the Registration Desk on the 1st floor of the [facility]. To find out your arrival time, please call [phone] between 1PM - 3PM on: Wednesday, December 28  REMEMBER: Instructions that are not followed completely may result in serious medical risk, up to and including death; or upon the discretion of your surgeon and anesthesiologist your surgery may need to be rescheduled.  Do not eat food after midnight the night before surgery.  No gum chewing, lozengers or hard candies.  You may however, drink CLEAR liquids up to 2 hours before you are scheduled to arrive for your surgery. Do not drink anything within 2 hours of your scheduled arrival time.  Clear liquids include: - water  - apple juice without pulp - gatorade (not RED, PURPLE, OR BLUE) - black coffee or tea (Do NOT add milk or creamers to the coffee or tea) Do NOT drink anything that is not on this list.  TAKE THESE MEDICATIONS THE MORNING OF SURGERY WITH A SIP OF WATER:  Albuterol inhaler Amlodipine Omeprazole (Prilosec) - (take one the night before and one on the morning of surgery - helps to prevent nausea after surgery.)  Use inhalers on the day of surgery and bring to the hospital.  One week prior to surgery:starting December 22 Stop Anti-inflammatories (NSAIDS) such as Advil, Aleve, Ibuprofen, Motrin, Naproxen, Naprosyn and Aspirin based products such as Excedrin, Goodys Powder, BC Powder. Stop ANY OVER THE COUNTER supplements until after surgery. You may however, continue to take Tylenol if needed for pain up until the day of surgery.  No Alcohol for 24 hours before or after surgery.  No Smoking including e-cigarettes for 24 hours prior to surgery.  No chewable tobacco products for at least 6 hours prior to surgery.  No nicotine patches on the day of surgery.  Do not use any "recreational" drugs for at least a week prior to your surgery.  Please  be advised that the combination of cocaine and anesthesia may have negative outcomes, up to and including death. If you test positive for cocaine, your surgery will be cancelled.  On the morning of surgery brush your teeth with toothpaste and water, you may rinse your mouth with mouthwash if you wish. Do not swallow any toothpaste or mouthwash.  Use CHG Soap as directed on instruction sheet.  Do not wear jewelry, make-up, hairpins, clips or nail polish.  Do not wear lotions, powders, or perfumes.   Do not shave body from the neck down 48 hours prior to surgery just in case you cut yourself which could leave a site for infection.  Also, freshly shaved skin may become irritated if using the CHG soap.  Contact lenses, hearing aids and dentures may not be worn into surgery.  Do not bring valuables to the hospital. [facility] is not responsible for any missing/lost belongings or valuables.   Notify your doctor if there is any change in your medical condition (cold, fever, infection).  Wear comfortable clothing (specific to your surgery type) to the hospital.  After surgery, you can help prevent lung complications by doing breathing exercises.  Take deep breaths and cough every 1-2 hours. Your doctor may order a device called an Incentive Spirometer to help you take deep breaths. When coughing or sneezing, hold a pillow firmly against your incision with both hands. This is called splinting. Doing this helps protect your incision. It also decreases belly discomfort.  If you are  being discharged the day of surgery, you will not be allowed to drive home. You will need a responsible adult (18 years or older) to drive you home and stay with you that night.   If you are taking public transportation, you will need to have a responsible adult (18 years or older) with you. Please confirm with your physician that it is acceptable to use public transportation.   Please call the [facility] at [phone] if you have any questions about these instructions.  Surgery Visitation Policy:  Patients undergoing a surgery or procedure may have one family member or support person with them as long as that person is not COVID-19 positive or experiencing its symptoms.  That person may remain in the waiting area during the procedure and may rotate out with other people.

## 2021-06-28 ENCOUNTER — Encounter: Payer: Self-pay | Admitting: Gastroenterology

## 2021-06-28 ENCOUNTER — Ambulatory Visit
Admission: RE | Admit: 2021-06-28 | Discharge: 2021-06-28 | Disposition: A | Discharge status: Home / Self Care | Payer: 59 | Attending: Gastroenterology | Admitting: Gastroenterology

## 2021-06-28 ENCOUNTER — Other Ambulatory Visit: Payer: Self-pay

## 2021-06-28 ENCOUNTER — Ambulatory Visit: Payer: 59 | Admitting: Anesthesiology

## 2021-06-28 ENCOUNTER — Encounter
Admission: RE | Disposition: A | Discharge status: Home / Self Care | Payer: Self-pay | Source: Home / Self Care | Attending: Gastroenterology

## 2021-06-28 DIAGNOSIS — K639 Disease of intestine, unspecified: Secondary | ICD-10-CM

## 2021-06-28 DIAGNOSIS — K644 Residual hemorrhoidal skin tags: Secondary | ICD-10-CM | POA: Insufficient documentation

## 2021-06-28 DIAGNOSIS — Z1211 Encounter for screening for malignant neoplasm of colon: Secondary | ICD-10-CM | POA: Diagnosis present

## 2021-06-28 DIAGNOSIS — K3189 Other diseases of stomach and duodenum: Secondary | ICD-10-CM | POA: Insufficient documentation

## 2021-06-28 DIAGNOSIS — Z79899 Other long term (current) drug therapy: Secondary | ICD-10-CM | POA: Diagnosis not present

## 2021-06-28 DIAGNOSIS — I10 Essential (primary) hypertension: Secondary | ICD-10-CM | POA: Insufficient documentation

## 2021-06-28 DIAGNOSIS — D123 Benign neoplasm of transverse colon: Secondary | ICD-10-CM | POA: Diagnosis not present

## 2021-06-28 DIAGNOSIS — Z86010 Personal history of colonic polyps: Secondary | ICD-10-CM | POA: Diagnosis not present

## 2021-06-28 DIAGNOSIS — F17210 Nicotine dependence, cigarettes, uncomplicated: Secondary | ICD-10-CM | POA: Insufficient documentation

## 2021-06-28 DIAGNOSIS — J449 Chronic obstructive pulmonary disease, unspecified: Secondary | ICD-10-CM | POA: Insufficient documentation

## 2021-06-28 DIAGNOSIS — K219 Gastro-esophageal reflux disease without esophagitis: Secondary | ICD-10-CM | POA: Insufficient documentation

## 2021-06-28 DIAGNOSIS — K5730 Diverticulosis of large intestine without perforation or abscess without bleeding: Secondary | ICD-10-CM | POA: Diagnosis not present

## 2021-06-28 DIAGNOSIS — K648 Other hemorrhoids: Secondary | ICD-10-CM | POA: Diagnosis not present

## 2021-06-28 DIAGNOSIS — D124 Benign neoplasm of descending colon: Secondary | ICD-10-CM | POA: Insufficient documentation

## 2021-06-28 HISTORY — PX: COLONOSCOPY WITH PROPOFOL: SHX5780

## 2021-06-28 HISTORY — PX: POLYPECTOMY: SHX5525

## 2021-06-28 HISTORY — PX: BIOPSY: SHX5522

## 2021-06-28 HISTORY — PX: ESOPHAGOGASTRODUODENOSCOPY (EGD) WITH PROPOFOL: SHX5813

## 2021-06-28 HISTORY — DX: Chronic obstructive pulmonary disease, unspecified: J44.9

## 2021-06-28 SURGERY — ESOPHAGOGASTRODUODENOSCOPY (EGD) WITH PROPOFOL
Anesthesia: General | Site: Rectum

## 2021-06-28 MED ORDER — LIDOCAINE HCL (CARDIAC) PF 100 MG/5ML IV SOSY
PREFILLED_SYRINGE | INTRAVENOUS | Status: DC | PRN
  Administered 2021-06-28: 40 mg via INTRAVENOUS
  Administered 2021-06-28: 50 mg via INTRAVENOUS

## 2021-06-28 MED ORDER — SODIUM CHLORIDE 0.9 % IV SOLN: INTRAVENOUS | Status: DC

## 2021-06-28 MED ORDER — LACTATED RINGERS IV SOLN: INTRAVENOUS | Status: DC

## 2021-06-28 MED ORDER — GLYCOPYRROLATE 0.2 MG/ML IJ SOLN
INTRAMUSCULAR | Status: DC | PRN
  Administered 2021-06-28: .1 mg via INTRAVENOUS

## 2021-06-28 MED ORDER — PROPOFOL 10 MG/ML IV BOLUS
INTRAVENOUS | Status: DC | PRN
  Administered 2021-06-28: 50 mg via INTRAVENOUS
  Administered 2021-06-28: 70 mg via INTRAVENOUS
  Administered 2021-06-28: 50 mg via INTRAVENOUS
  Administered 2021-06-28: 30 mg via INTRAVENOUS

## 2021-06-28 MED ORDER — STERILE WATER FOR IRRIGATION IR SOLN
Status: DC | PRN
  Administered 2021-06-28: 1

## 2021-06-28 SURGICAL SUPPLY — 38 items
BALLN DILATOR 10-12 8 (BALLOONS)
BALLN DILATOR 12-15 8 (BALLOONS)
BALLN DILATOR 15-18 8 (BALLOONS)
BALLN DILATOR CRE 0-12 8 (BALLOONS)
BALLN DILATOR ESOPH 8 10 CRE (MISCELLANEOUS) IMPLANT
BALLOON DILATOR 12-15 8 (BALLOONS) IMPLANT
BALLOON DILATOR 15-18 8 (BALLOONS) IMPLANT
BALLOON DILATOR CRE 0-12 8 (BALLOONS) IMPLANT
BLOCK BITE 60FR ADLT L/F GRN (MISCELLANEOUS) ×4 IMPLANT
CLIP HMST 235XBRD CATH ROT (MISCELLANEOUS) IMPLANT
CLIP RESOLUTION 360 11X235 (MISCELLANEOUS)
ELECT REM PT RETURN 9FT ADLT (ELECTROSURGICAL)
ELECTRODE REM PT RTRN 9FT ADLT (ELECTROSURGICAL) IMPLANT
FCP ESCP3.2XJMB 240X2.8X (MISCELLANEOUS) ×2
FORCEPS BIOP RAD 4 LRG CAP 4 (CUTTING FORCEPS) IMPLANT
FORCEPS BIOP RJ4 240 W/NDL (MISCELLANEOUS) ×4
FORCEPS ESCP3.2XJMB 240X2.8X (MISCELLANEOUS) IMPLANT
GOWN CVR UNV OPN BCK APRN NK (MISCELLANEOUS) ×8 IMPLANT
GOWN ISOL THUMB LOOP REG UNIV (MISCELLANEOUS) ×16
INJECTOR VARIJECT VIN23 (MISCELLANEOUS) IMPLANT
KIT DEFENDO VALVE AND CONN (KITS) IMPLANT
KIT PRC NS LF DISP ENDO (KITS) ×4 IMPLANT
KIT PROCEDURE OLYMPUS (KITS) ×8
MANIFOLD NEPTUNE II (INSTRUMENTS) ×8 IMPLANT
MARKER SPOT ENDO TATTOO 5ML (MISCELLANEOUS) IMPLANT
PROBE APC STR FIRE (PROBE) IMPLANT
RETRIEVER NET PLAT FOOD (MISCELLANEOUS) IMPLANT
RETRIEVER NET ROTH 2.5X230 LF (MISCELLANEOUS) IMPLANT
SNARE COLD EXACTO (MISCELLANEOUS) ×2 IMPLANT
SNARE SHORT THROW 13M SML OVAL (MISCELLANEOUS) IMPLANT
SNARE SHORT THROW 30M LRG OVAL (MISCELLANEOUS) IMPLANT
SNARE SNG USE RND 15MM (INSTRUMENTS) IMPLANT
SPOT EX ENDOSCOPIC TATTOO (MISCELLANEOUS)
SYR INFLATION 60ML (SYRINGE) IMPLANT
TRAP ETRAP POLY (MISCELLANEOUS) ×2 IMPLANT
VARIJECT INJECTOR VIN23 (MISCELLANEOUS)
WATER STERILE IRR 250ML POUR (IV SOLUTION) ×8 IMPLANT
WIRE CRE 18-20MM 8CM F G (MISCELLANEOUS) IMPLANT

## 2021-06-28 NOTE — Transfer of Care (Signed)
Immediate Anesthesia Transfer of Care Note  Patient: [name]  Procedure(s) Performed: ESOPHAGOGASTRODUODENOSCOPY (EGD) WITH PROPOFOL (Esophagus) BIOPSY (Esophagus) COLONOSCOPY WITH PROPOFOL (Rectum) POLYPECTOMY (Rectum)  Patient Location: PACU  Anesthesia Type: General  Level of Consciousness: awake, alert  and patient cooperative  Airway and Oxygen Therapy: Patient Spontanous Breathing and Patient connected to supplemental oxygen  Post-op Assessment: Post-op Vital signs reviewed, Patient's Cardiovascular Status Stable, Respiratory Function Stable, Patent Airway and No signs of Nausea or vomiting  Post-op Vital Signs: Reviewed and stable  Complications: No notable events documented.

## 2021-06-28 NOTE — Op Note (Signed)
[facility] Gastroenterology Patient Name: [name] Procedure Date: 06/28/2021 9:29 AM MRN: [id number] Account #: [id number] Date of Birth: July 16, 1955 Admit Type: Outpatient Age: 65 Room: [facility] OR ROOM 01 Gender: Male Note Status: Finalized Instrument Name: [id number] Procedure:             Upper GI endoscopy Indications:           h/o eosinophilic gastroenteritis Providers:             Lin Landsman MD, MD Referring MD:          Cletis Athens, MD (Referring MD) Medicines:             General Anesthesia Complications:         No immediate complications. Estimated blood loss: None. Procedure:             Pre-Anesthesia Assessment:                        - Prior to the procedure, a History and Physical was                         performed, and patient medications and allergies were                         reviewed. The patient is competent. The risks and                         benefits of the procedure and the sedation options and                         risks were discussed with the patient. All questions                         were answered and informed consent was obtained.                         Patient identification and proposed procedure were                         verified by the physician, the nurse, the                         anesthesiologist, the anesthetist and the technician                         in the pre-procedure area in the procedure room in the                         endoscopy suite. Mental Status Examination: alert and                         oriented. Airway Examination: normal oropharyngeal                         airway and neck mobility. Respiratory Examination:                         clear to auscultation. CV Examination: normal.  Prophylactic Antibiotics: The patient does not require                         prophylactic antibiotics. Prior Anticoagulants: The                         patient has taken no  previous anticoagulant or                         antiplatelet agents. ASA Grade Assessment: III - A                         patient with severe systemic disease. After reviewing                         the risks and benefits, the patient was deemed in                         satisfactory condition to undergo the procedure. The                         anesthesia plan was to use general anesthesia.                         Immediately prior to administration of medications,                         the patient was re-assessed for adequacy to receive                         sedatives. The heart rate, respiratory rate, oxygen                         saturations, blood pressure, adequacy of pulmonary                         ventilation, and response to care were monitored                         throughout the procedure. The physical status of the                         patient was re-assessed after the procedure.                        After obtaining informed consent, the endoscope was                         passed under direct vision. Throughout the procedure,                         the patient's blood pressure, pulse, and oxygen                         saturations were monitored continuously. The Endoscope                         was introduced through the mouth, and advanced to the  second part of duodenum. The upper GI endoscopy was                         accomplished without difficulty. The patient tolerated                         the procedure poorly due to the patient's respiratory                         instability (hypoxia) which was corrected and patient                         was stabilized, procedure completed. Findings:      The duodenal bulb and second portion of the duodenum were normal.       Biopsies were taken with a cold forceps for histology.      Diffuse nodular mucosa was found in the gastric fundus and in the       gastric body. Biopsies  were taken with a cold forceps for histology.      The incisura and gastric antrum were normal. Biopsies were taken with a       cold forceps for histology.      The cardia and gastric fundus were normal on retroflexion.      Esophagogastric landmarks were identified: the gastroesophageal junction       was found at 40 cm from the incisors.      The gastroesophageal junction and examined esophagus were normal. Impression:            - Normal duodenal bulb and second portion of the                         duodenum. Biopsied.                        - Nodular mucosa in the gastric fundus and in the                         gastric body. Biopsied.                        - Normal incisura and antrum. Biopsied.                        - Esophagogastric landmarks identified.                        - Normal gastroesophageal junction and esophagus. Recommendation:        - Await pathology results.                        - Proceed with colonoscopy as scheduled                        See colonoscopy report Procedure Code(s):     --- Professional ---                        [phone], Esophagogastroduodenoscopy, flexible,                         transoral; with biopsy, single or  multiple Diagnosis Code(s):     --- Professional ---                        K31.89, Other diseases of stomach and duodenum CPT copyright 2019 American Medical Association. All rights reserved. The codes documented in this report are preliminary and upon coder review may  be revised to meet current compliance requirements. Dr. Ulyess Mort Lin Landsman MD, MD 06/28/2021 9:59:01 AM This report has been signed electronically. Number of Addenda: 0 Note Initiated On: 06/28/2021 9:29 AM Total Procedure Duration: 0 hours 16 minutes 0 seconds  Estimated Blood Loss:  Estimated blood loss: none.      [facility]

## 2021-06-28 NOTE — Op Note (Signed)
[facility] Gastroenterology Patient Name: [name] Procedure Date: 06/28/2021 9:28 AM MRN: [id number] Account #: [id number] Date of Birth: 08-29-1955 Admit Type: Outpatient Age: 65 Room: [facility] OR ROOM 01 Gender: Male Note Status: Finalized Instrument Name: [id number] Procedure:             Colonoscopy Indications:           Surveillance: Personal history of adenomatous polyps                         on last colonoscopy > 5 years ago, Last colonoscopy:                         November 2013 Providers:             Lin Landsman MD, MD Referring MD:          Cletis Athens, MD (Referring MD) Medicines:             General Anesthesia Complications:         No immediate complications. Estimated blood loss: None. Procedure:             Pre-Anesthesia Assessment:                        - Prior to the procedure, a History and Physical was                         performed, and patient medications and allergies were                         reviewed. The patient is competent. The risks and                         benefits of the procedure and the sedation options and                         risks were discussed with the patient. All questions                         were answered and informed consent was obtained.                         Patient identification and proposed procedure were                         verified by the physician, the nurse, the                         anesthesiologist, the anesthetist and the technician                         in the pre-procedure area in the procedure room in the                         endoscopy suite. Mental Status Examination: alert and                         oriented. Airway Examination: normal oropharyngeal  airway and neck mobility. Respiratory Examination:                         clear to auscultation. CV Examination: normal.                         Prophylactic Antibiotics: The patient does not  require                         prophylactic antibiotics. Prior Anticoagulants: The                         patient has taken no previous anticoagulant or                         antiplatelet agents. ASA Grade Assessment: III - A                         patient with severe systemic disease. After reviewing                         the risks and benefits, the patient was deemed in                         satisfactory condition to undergo the procedure. The                         anesthesia plan was to use general anesthesia.                         Immediately prior to administration of medications,                         the patient was re-assessed for adequacy to receive                         sedatives. The heart rate, respiratory rate, oxygen                         saturations, blood pressure, adequacy of pulmonary                         ventilation, and response to care were monitored                         throughout the procedure. The physical status of the                         patient was re-assessed after the procedure.                        After obtaining informed consent, the colonoscope was                         passed under direct vision. Throughout the procedure,                         the patient's blood pressure, pulse, and oxygen  saturations were monitored continuously. The                         Colonoscope was introduced through the anus and                         advanced to the the cecum, identified by appendiceal                         orifice and ileocecal valve. The colonoscopy was                         performed without difficulty. The patient tolerated                         the procedure well. The quality of the bowel                         preparation was evaluated using the BBPS [facility] Bowel                         Preparation Scale) with scores of: Right Colon = 3,                         Transverse Colon = 3 and Left  Colon = 3 (entire mucosa                         seen well with no residual staining, small fragments                         of stool or opaque liquid). The total BBPS score                         equals 9. Findings:      Skin tags were found on perianal exam.      Three sessile polyps were found in the descending colon and transverse       colon. The polyps were 5 to 6 mm in size. These polyps were removed with       a cold snare. Resection and retrieval were complete.      Multiple diverticula were found in the recto-sigmoid colon, sigmoid       colon, descending colon and ascending colon. There was no evidence of       diverticular bleeding.      Non-bleeding external and internal hemorrhoids were found during       retroflexion. The hemorrhoids were medium-sized. Impression:            - Perianal skin tags found on perianal exam.                        - Three 5 to 6 mm polyps in the descending colon and                         in the transverse colon, removed with a cold snare.                         Resected and retrieved.                        -  Severe diverticulosis in the recto-sigmoid colon, in                         the sigmoid colon, in the descending colon and in the                         ascending colon. There was no evidence of diverticular                         bleeding.                        - Non-bleeding external and internal hemorrhoids. Recommendation:        - Discharge patient to home (with escort).                        - Resume previous diet today.                        - Continue present medications.                        - Await pathology results.                        - Repeat colonoscopy in 3 - 5 years for surveillance. Procedure Code(s):     --- Professional ---                        [phone], Colonoscopy, flexible; with removal of                         tumor(s), polyp(s), or other lesion(s) by snare                          technique Diagnosis Code(s):     --- Professional ---                        Z86.010, Personal history of colonic polyps                        K63.5, Polyp of colon                        K64.8, Other hemorrhoids                        K64.4, Residual hemorrhoidal skin tags                        K57.30, Diverticulosis of large intestine without                         perforation or abscess without bleeding CPT copyright 2019 American Medical Association. All rights reserved. The codes documented in this report are preliminary and upon coder review may  be revised to meet current compliance requirements. Dr. Ulyess Mort Lin Landsman MD, MD 06/28/2021 10:24:41 AM This report has been signed electronically. Number of Addenda: 0 Note Initiated On: 06/28/2021 9:28 AM Scope Withdrawal Time: 0 hours 14 minutes 33 seconds  Total Procedure Duration: 0  hours 16 minutes 46 seconds  Estimated Blood Loss:  Estimated blood loss: none.      [facility]

## 2021-06-28 NOTE — Anesthesia Preprocedure Evaluation (Signed)
Anesthesia Evaluation  Patient identified by MRN, date of birth, ID band Patient awake    Reviewed: Allergy & Precautions, NPO status   Airway Mallampati: II  TM Distance: >3 FB     Dental   Pulmonary COPD, Current SmokerPatient did not abstain from smoking.,    Pulmonary exam normal        Cardiovascular hypertension,  Rhythm:Regular Rate:Normal     Neuro/Psych TIA   GI/Hepatic GERD  ,  Endo/Other    Renal/GU      Musculoskeletal  (+) Arthritis ,   Abdominal   Peds  Hematology   Anesthesia Other Findings Hx ETOH abuse  Reproductive/Obstetrics                             Anesthesia Physical Anesthesia Plan  ASA: 3  Anesthesia Plan: General   Post-op Pain Management:    Induction: Intravenous  PONV Risk Score and Plan: Propofol infusion, TIVA and Treatment may vary due to age or medical condition  Airway Management Planned: Natural Airway and Nasal Cannula  Additional Equipment:   Intra-op Plan:   Post-operative Plan:   Informed Consent: I have reviewed the patients History and Physical, chart, labs and discussed the procedure including the risks, benefits and alternatives for the proposed anesthesia with the patient or authorized representative who has indicated his/her understanding and acceptance.       Plan Discussed with: CRNA  Anesthesia Plan Comments:         Anesthesia Quick Evaluation

## 2021-06-28 NOTE — H&P (Signed)
[name], MD [address]  [address]  [place], [place] [address]  Main: [phone]  Fax: [phone] Pager: [phone]  Primary Care Physician:  Cletis Athens, MD Primary Gastroenterologist:  Dr. [name]  Pre-Procedure History & Physical: HPI:  [name] is a 65 y.o. male is here for an endoscopy and colonoscopy.   Past Medical History:  Diagnosis Date   Acid reflux    Alcohol abuse    Arthritis    "left pinky" (10/17/2014)   COPD (chronic obstructive pulmonary disease) (HCC)    DDD (degenerative disc disease), lumbar    Gastroenteritis    History of stress test 2001   no ischemia   Hypertension    Inguinal hernia    Ribs, multiple fractures 12/2018   TIA (transient ischemic attack) 08/22/2017   (also 04/08/18)  No deficits   Tobacco abuse    Vertebral compression fracture (HCC)    T11, L1    Past Surgical History:  Procedure Laterality Date   EXPLORATORY LAPAROTOMY W/ BOWEL RESECTION  03/09/1979    Prior to Admission medications   Medication Sig Start Date End Date Taking? Authorizing Provider  albuterol (VENTOLIN HFA) 108 (90 Base) MCG/ACT inhaler Inhale 2 puffs into the lungs every 6 (six) hours as needed for wheezing or shortness of breath. 05/29/21  Yes Masoud, Viann Shove, MD  amLODipine (NORVASC) 5 MG tablet Take 1 tablet (5 mg total) by mouth daily. 05/29/21 05/29/22 Yes Masoud, Viann Shove, MD  omeprazole (PRILOSEC) 20 MG capsule Take 20 mg by mouth daily.   Yes [provider]    Allergies as of 06/11/2021 - Review Complete 06/11/2021  Allergen Reaction Noted   Penicillins Anaphylaxis 06/12/2011   Morphine and related Itching 02/24/2016    Family History  Problem Relation Age of Onset   Kidney disease Mother    Diabetes Mother    Hypertension Mother    Heart disease Father    Cancer Father    Stroke Father    Heart failure Father    Heart failure Other     Social History   Socioeconomic History   Marital  status: Significant Other    Spouse name: Montise   Number of children: 3   Years of education: Not on file   Highest education level: Associate degree: occupational, Hotel manager, or vocational program  Occupational History   Occupation: Retired  Tobacco Use   Smoking status: Every Day    Packs/day: 0.50    Years: 27.00    Pack years: 13.50    Types: Cigarettes   Smokeless tobacco: Never  Vaping Use   Vaping Use: Never used  Substance and Sexual Activity   Alcohol use: Yes    Alcohol/week: 7.0 standard drinks    Types: 7 Standard drinks or equivalent per week    Comment: 2020 " SOMETIMES DAILY, SOMETIMES I DONT TOUCH ALCOHOL "   Drug use: Not Currently    Types: Marijuana    Comment: "last marijuana was in the 1980's"   Sexual activity: Yes    Birth control/protection: None  Other Topics Concern   Not on file  Social History Narrative   Not on file   Social Determinants of Health   Financial Resource Strain: Not on file  Food Insecurity: Not on file  Transportation Needs: Not on file  Physical Activity: Not on file  Stress: Not on file  Social Connections: Not on file  Intimate Partner Violence: Not on file    Review  of Systems: See HPI, otherwise negative ROS  Physical Exam: BP (!) 149/94    Pulse (!) 110    Temp (!) 97.4 F (36.3 C)    Ht 5\' 11"  (1.803 m)    Wt 97.5 kg    SpO2 98%    BMI 29.99 kg/m  General:   Alert,  pleasant and cooperative in NAD Head:  Normocephalic and atraumatic. Neck:  Supple; no masses or thyromegaly. Lungs:  Clear throughout to auscultation.    Heart:  Regular rate and rhythm. Abdomen:  Soft, nontender and nondistended. Normal bowel sounds, without guarding, and without rebound.   Neurologic:  Alert and  oriented x4;  grossly normal neurologically.  Impression/Plan: [name] is here for an endoscopy and colonoscopy to be performed for h/o ?eosinophilic gastroenteritis, h/o colon adenoma  Risks, benefits, limitations,  and alternatives regarding  endoscopy and colonoscopy have been reviewed with the patient.  Questions have been answered.  All parties agreeable.   Sherri Sear, MD  06/28/2021, 8:48 AM

## 2021-06-28 NOTE — Anesthesia Postprocedure Evaluation (Signed)
Anesthesia Post Note  Patient: [name]  Procedure(s) Performed: ESOPHAGOGASTRODUODENOSCOPY (EGD) WITH PROPOFOL (Esophagus) BIOPSY (Esophagus) COLONOSCOPY WITH PROPOFOL (Rectum) POLYPECTOMY (Rectum)     Patient location during evaluation: PACU Anesthesia Type: General Level of consciousness: awake Pain management: pain level controlled Vital Signs Assessment: post-procedure vital signs reviewed and stable Respiratory status: respiratory function stable Cardiovascular status: stable Postop Assessment: no signs of nausea or vomiting Anesthetic complications: no   No notable events documented.  Veda Canning

## 2021-06-29 ENCOUNTER — Encounter
Admission: RE | Admit: 2021-06-29 | Discharge: 2021-06-29 | Disposition: A | Discharge status: Home / Self Care | Payer: 59 | Source: Ambulatory Visit | Attending: Surgery | Admitting: Surgery

## 2021-06-29 ENCOUNTER — Encounter: Payer: Self-pay | Admitting: Urgent Care

## 2021-06-29 ENCOUNTER — Encounter: Payer: Self-pay | Admitting: Gastroenterology

## 2021-06-29 DIAGNOSIS — G459 Transient cerebral ischemic attack, unspecified: Secondary | ICD-10-CM | POA: Insufficient documentation

## 2021-06-29 DIAGNOSIS — Z01812 Encounter for preprocedural laboratory examination: Secondary | ICD-10-CM

## 2021-06-29 DIAGNOSIS — I10 Essential (primary) hypertension: Secondary | ICD-10-CM | POA: Diagnosis not present

## 2021-06-29 DIAGNOSIS — Z01818 Encounter for other preprocedural examination: Secondary | ICD-10-CM | POA: Insufficient documentation

## 2021-06-29 DIAGNOSIS — Z01810 Encounter for preprocedural cardiovascular examination: Secondary | ICD-10-CM | POA: Diagnosis not present

## 2021-06-29 LAB — POTASSIUM: Potassium: 3.6 mmol/L (ref 3.5–5.1)

## 2021-06-29 LAB — SURGICAL PATHOLOGY

## 2021-07-04 ENCOUNTER — Telehealth: Payer: Self-pay

## 2021-07-04 NOTE — Telephone Encounter (Signed)
Medical Clearance received from [facility] -low risk-patient optimized for surgery.

## 2021-07-05 ENCOUNTER — Observation Stay
Admission: RE | Admit: 2021-07-05 | Discharge: 2021-07-06 | Disposition: A | Discharge status: Home / Self Care | Payer: 59 | Attending: Surgery | Admitting: Surgery

## 2021-07-05 ENCOUNTER — Encounter: Payer: Self-pay | Admitting: Surgery

## 2021-07-05 ENCOUNTER — Ambulatory Visit: Payer: 59 | Admitting: Registered Nurse

## 2021-07-05 ENCOUNTER — Other Ambulatory Visit: Payer: Self-pay

## 2021-07-05 ENCOUNTER — Encounter
Admission: RE | Disposition: A | Discharge status: Home / Self Care | Payer: Self-pay | Source: Home / Self Care | Attending: Surgery

## 2021-07-05 DIAGNOSIS — I10 Essential (primary) hypertension: Secondary | ICD-10-CM | POA: Diagnosis not present

## 2021-07-05 DIAGNOSIS — Z8719 Personal history of other diseases of the digestive system: Secondary | ICD-10-CM

## 2021-07-05 DIAGNOSIS — J449 Chronic obstructive pulmonary disease, unspecified: Secondary | ICD-10-CM | POA: Diagnosis not present

## 2021-07-05 DIAGNOSIS — F17210 Nicotine dependence, cigarettes, uncomplicated: Secondary | ICD-10-CM | POA: Diagnosis not present

## 2021-07-05 DIAGNOSIS — K432 Incisional hernia without obstruction or gangrene: Secondary | ICD-10-CM

## 2021-07-05 DIAGNOSIS — K4020 Bilateral inguinal hernia, without obstruction or gangrene, not specified as recurrent: Secondary | ICD-10-CM | POA: Diagnosis present

## 2021-07-05 DIAGNOSIS — Z9889 Other specified postprocedural states: Secondary | ICD-10-CM

## 2021-07-05 HISTORY — PX: INCISIONAL HERNIA REPAIR: SHX193

## 2021-07-05 LAB — CBC
HCT: 35.5 % — ABNORMAL LOW (ref 39.0–52.0)
Hemoglobin: 11.7 g/dL — ABNORMAL LOW (ref 13.0–17.0)
MCH: 30.2 pg (ref 26.0–34.0)
MCHC: 33 g/dL (ref 30.0–36.0)
MCV: 91.5 fL (ref 80.0–100.0)
Platelets: 244 10*3/uL (ref 150–400)
RBC: 3.88 MIL/uL — ABNORMAL LOW (ref 4.22–5.81)
RDW: 19.9 % — ABNORMAL HIGH (ref 11.5–15.5)
WBC: 14.1 10*3/uL — ABNORMAL HIGH (ref 4.0–10.5)
nRBC: 0 % (ref 0.0–0.2)

## 2021-07-05 LAB — CREATININE, SERUM
Creatinine, Ser: 0.8 mg/dL (ref 0.61–1.24)
GFR, Estimated: >60 mL/min (ref >60)

## 2021-07-05 SURGERY — REPAIR, HERNIA, INGUINAL, BILATERAL, ROBOT-ASSISTED
Anesthesia: General | Site: Abdomen

## 2021-07-05 MED ORDER — ONDANSETRON HCL 4 MG/2ML IJ SOLN
INTRAMUSCULAR | Status: AC
  Filled 2021-07-05: qty 2

## 2021-07-05 MED ORDER — 0.9 % SODIUM CHLORIDE (POUR BTL) OPTIME
TOPICAL | Status: DC | PRN
  Administered 2021-07-05: 16:00:00 300 mL

## 2021-07-05 MED ORDER — PROCHLORPERAZINE MALEATE 10 MG PO TABS
10.0000 mg | ORAL_TABLET | Freq: Four times a day (QID) | ORAL | Status: DC | PRN
  Filled 2021-07-05: qty 1

## 2021-07-05 MED ORDER — ACETAMINOPHEN 500 MG PO TABS
1000.0000 mg | ORAL_TABLET | ORAL | Status: AC
  Administered 2021-07-05: 12:00:00 1000 mg via ORAL

## 2021-07-05 MED ORDER — ALBUTEROL SULFATE (2.5 MG/3ML) 0.083% IN NEBU: 2.5000 mg | INHALATION_SOLUTION | Freq: Four times a day (QID) | RESPIRATORY_TRACT | Status: DC | PRN

## 2021-07-05 MED ORDER — CHLORHEXIDINE GLUCONATE CLOTH 2 % EX PADS: 6.0000 | MEDICATED_PAD | Freq: Once | CUTANEOUS | Status: DC

## 2021-07-05 MED ORDER — DEXAMETHASONE SODIUM PHOSPHATE 10 MG/ML IJ SOLN
INTRAMUSCULAR | Status: AC
  Filled 2021-07-05: qty 1

## 2021-07-05 MED ORDER — ACETAMINOPHEN 500 MG PO TABS
1000.0000 mg | ORAL_TABLET | Freq: Four times a day (QID) | ORAL | Status: DC
  Administered 2021-07-05 – 2021-07-06 (×4): 1000 mg via ORAL
  Filled 2021-07-05 (×4): qty 2

## 2021-07-05 MED ORDER — ROCURONIUM BROMIDE 100 MG/10ML IV SOLN
INTRAVENOUS | Status: DC | PRN
  Administered 2021-07-05: 30 mg via INTRAVENOUS
  Administered 2021-07-05: 20 mg via INTRAVENOUS
  Administered 2021-07-05: 50 mg via INTRAVENOUS
  Administered 2021-07-05: 20 mg via INTRAVENOUS

## 2021-07-05 MED ORDER — ONDANSETRON HCL 4 MG/2ML IJ SOLN
INTRAMUSCULAR | Status: DC | PRN
  Administered 2021-07-05: 4 mg via INTRAVENOUS

## 2021-07-05 MED ORDER — ROCURONIUM BROMIDE 10 MG/ML (PF) SYRINGE
PREFILLED_SYRINGE | INTRAVENOUS | Status: AC
  Filled 2021-07-05: qty 10

## 2021-07-05 MED ORDER — PHENYLEPHRINE HCL-NACL 20-0.9 MG/250ML-% IV SOLN
INTRAVENOUS | Status: DC | PRN
  Administered 2021-07-05: 30 ug/min via INTRAVENOUS

## 2021-07-05 MED ORDER — FENTANYL CITRATE (PF) 100 MCG/2ML IJ SOLN
INTRAMUSCULAR | Status: AC
  Administered 2021-07-05: 18:00:00 25 ug
  Filled 2021-07-05: qty 2

## 2021-07-05 MED ORDER — LIDOCAINE HCL (CARDIAC) PF 100 MG/5ML IV SOSY
PREFILLED_SYRINGE | INTRAVENOUS | Status: DC | PRN
  Administered 2021-07-05: 50 mg via INTRAVENOUS

## 2021-07-05 MED ORDER — PROPOFOL 10 MG/ML IV BOLUS
INTRAVENOUS | Status: DC | PRN
  Administered 2021-07-05: 200 mg via INTRAVENOUS

## 2021-07-05 MED ORDER — METRONIDAZOLE 500 MG/100ML IV SOLN
500.0000 mg | Freq: Two times a day (BID) | INTRAVENOUS | Status: AC
  Administered 2021-07-05 – 2021-07-06 (×2): 500 mg via INTRAVENOUS
  Filled 2021-07-05 (×2): qty 100

## 2021-07-05 MED ORDER — DEXAMETHASONE SODIUM PHOSPHATE 10 MG/ML IJ SOLN
INTRAMUSCULAR | Status: DC | PRN
  Administered 2021-07-05: 10 mg via INTRAVENOUS

## 2021-07-05 MED ORDER — CELECOXIB 200 MG PO CAPS
200.0000 mg | ORAL_CAPSULE | ORAL | Status: AC
  Administered 2021-07-05: 12:00:00 200 mg via ORAL

## 2021-07-05 MED ORDER — DIPHENHYDRAMINE HCL 12.5 MG/5ML PO ELIX
12.5000 mg | ORAL_SOLUTION | Freq: Four times a day (QID) | ORAL | Status: DC | PRN
  Filled 2021-07-05: qty 5

## 2021-07-05 MED ORDER — SODIUM CHLORIDE 0.9 % IV SOLN: 2.0000 g | Freq: Two times a day (BID) | INTRAVENOUS | Status: DC

## 2021-07-05 MED ORDER — FENTANYL CITRATE (PF) 100 MCG/2ML IJ SOLN
INTRAMUSCULAR | Status: AC
  Filled 2021-07-05: qty 2

## 2021-07-05 MED ORDER — CIPROFLOXACIN IN D5W 400 MG/200ML IV SOLN
400.0000 mg | Freq: Two times a day (BID) | INTRAVENOUS | Status: AC
  Administered 2021-07-05 – 2021-07-06 (×2): 400 mg via INTRAVENOUS
  Filled 2021-07-05 (×2): qty 200

## 2021-07-05 MED ORDER — PHENYLEPHRINE HCL (PRESSORS) 10 MG/ML IV SOLN
INTRAVENOUS | Status: DC | PRN
  Administered 2021-07-05: 80 ug via INTRAVENOUS
  Administered 2021-07-05 (×2): 160 ug via INTRAVENOUS
  Administered 2021-07-05 (×2): 80 ug via INTRAVENOUS

## 2021-07-05 MED ORDER — MEPERIDINE HCL 25 MG/ML IJ SOLN: 6.2500 mg | INTRAMUSCULAR | Status: DC | PRN

## 2021-07-05 MED ORDER — NICOTINE 14 MG/24HR TD PT24
14.0000 mg | MEDICATED_PATCH | Freq: Every day | TRANSDERMAL | Status: DC
  Administered 2021-07-05 – 2021-07-06 (×2): 14 mg via TRANSDERMAL
  Filled 2021-07-05 (×2): qty 1

## 2021-07-05 MED ORDER — CLINDAMYCIN PHOSPHATE 900 MG/50ML IV SOLN
INTRAVENOUS | Status: AC
  Filled 2021-07-05: qty 50

## 2021-07-05 MED ORDER — EPHEDRINE SULFATE 50 MG/ML IJ SOLN
INTRAMUSCULAR | Status: DC | PRN
  Administered 2021-07-05: 5 mg via INTRAVENOUS

## 2021-07-05 MED ORDER — MIDAZOLAM HCL 2 MG/2ML IJ SOLN
INTRAMUSCULAR | Status: AC
  Filled 2021-07-05: qty 2

## 2021-07-05 MED ORDER — ONDANSETRON HCL 4 MG/2ML IJ SOLN: 4.0000 mg | Freq: Once | INTRAMUSCULAR | Status: DC | PRN

## 2021-07-05 MED ORDER — ORAL CARE MOUTH RINSE: 15.0000 mL | Freq: Once | OROMUCOSAL | Status: AC

## 2021-07-05 MED ORDER — BUPIVACAINE-EPINEPHRINE (PF) 0.25% -1:200000 IJ SOLN
INTRAMUSCULAR | Status: AC
  Filled 2021-07-05: qty 30

## 2021-07-05 MED ORDER — ACETAMINOPHEN 500 MG PO TABS
ORAL_TABLET | ORAL | Status: AC
  Filled 2021-07-05: qty 2

## 2021-07-05 MED ORDER — SUGAMMADEX SODIUM 200 MG/2ML IV SOLN
INTRAVENOUS | Status: DC | PRN
  Administered 2021-07-05: 200 mg via INTRAVENOUS

## 2021-07-05 MED ORDER — BUPIVACAINE LIPOSOME 1.3 % IJ SUSP
INTRAMUSCULAR | Status: AC
  Filled 2021-07-05: qty 20

## 2021-07-05 MED ORDER — BUPIVACAINE-EPINEPHRINE (PF) 0.25% -1:200000 IJ SOLN
INTRAMUSCULAR | Status: DC | PRN
  Administered 2021-07-05: 16:00:00 50 mL via INTRACRANIAL

## 2021-07-05 MED ORDER — GABAPENTIN 300 MG PO CAPS
ORAL_CAPSULE | ORAL | Status: AC
  Filled 2021-07-05: qty 1

## 2021-07-05 MED ORDER — ENOXAPARIN SODIUM 40 MG/0.4ML IJ SOSY
40.0000 mg | PREFILLED_SYRINGE | INTRAMUSCULAR | Status: DC
  Administered 2021-07-06: 06:00:00 40 mg via SUBCUTANEOUS
  Filled 2021-07-05: qty 0.4

## 2021-07-05 MED ORDER — PROPOFOL 10 MG/ML IV BOLUS
INTRAVENOUS | Status: AC
  Filled 2021-07-05: qty 40

## 2021-07-05 MED ORDER — DIPHENHYDRAMINE HCL 50 MG/ML IJ SOLN: 12.5000 mg | Freq: Four times a day (QID) | INTRAMUSCULAR | Status: DC | PRN

## 2021-07-05 MED ORDER — OXYCODONE HCL 5 MG PO TABS
5.0000 mg | ORAL_TABLET | ORAL | Status: DC | PRN
  Administered 2021-07-06: 10:00:00 5 mg via ORAL
  Filled 2021-07-05: qty 1

## 2021-07-05 MED ORDER — PHENYLEPHRINE HCL-NACL 20-0.9 MG/250ML-% IV SOLN
INTRAVENOUS | Status: AC
  Filled 2021-07-05: qty 250

## 2021-07-05 MED ORDER — HYDROMORPHONE HCL 1 MG/ML IJ SOLN
0.5000 mg | INTRAMUSCULAR | Status: DC | PRN
  Administered 2021-07-05: 20:00:00 0.5 mg via INTRAVENOUS
  Filled 2021-07-05: qty 0.5

## 2021-07-05 MED ORDER — CLINDAMYCIN PHOSPHATE 900 MG/50ML IV SOLN
900.0000 mg | INTRAVENOUS | Status: AC
  Administered 2021-07-05: 13:00:00 900 mg via INTRAVENOUS

## 2021-07-05 MED ORDER — KETOROLAC TROMETHAMINE 15 MG/ML IJ SOLN
15.0000 mg | Freq: Four times a day (QID) | INTRAMUSCULAR | Status: DC
  Administered 2021-07-05 – 2021-07-06 (×4): 15 mg via INTRAVENOUS
  Filled 2021-07-05 (×4): qty 1

## 2021-07-05 MED ORDER — GABAPENTIN 300 MG PO CAPS
300.0000 mg | ORAL_CAPSULE | ORAL | Status: AC
  Administered 2021-07-05: 12:00:00 300 mg via ORAL

## 2021-07-05 MED ORDER — HYDRALAZINE HCL 20 MG/ML IJ SOLN: 10.0000 mg | INTRAMUSCULAR | Status: DC | PRN

## 2021-07-05 MED ORDER — SEVOFLURANE IN SOLN
RESPIRATORY_TRACT | Status: AC
  Filled 2021-07-05: qty 250

## 2021-07-05 MED ORDER — FENTANYL CITRATE (PF) 100 MCG/2ML IJ SOLN
INTRAMUSCULAR | Status: DC | PRN
  Administered 2021-07-05 (×4): 50 ug via INTRAVENOUS

## 2021-07-05 MED ORDER — AMLODIPINE BESYLATE 5 MG PO TABS
5.0000 mg | ORAL_TABLET | Freq: Every day | ORAL | Status: DC
  Administered 2021-07-06: 10:00:00 5 mg via ORAL
  Filled 2021-07-05: qty 1

## 2021-07-05 MED ORDER — EPHEDRINE 5 MG/ML INJ
INTRAVENOUS | Status: AC
  Filled 2021-07-05: qty 5

## 2021-07-05 MED ORDER — LACTATED RINGERS IV SOLN: INTRAVENOUS | Status: DC

## 2021-07-05 MED ORDER — SODIUM CHLORIDE 0.9 % IV SOLN: INTRAVENOUS | Status: DC

## 2021-07-05 MED ORDER — PROCHLORPERAZINE EDISYLATE 10 MG/2ML IJ SOLN: 5.0000 mg | Freq: Four times a day (QID) | INTRAMUSCULAR | Status: DC | PRN

## 2021-07-05 MED ORDER — FENTANYL CITRATE (PF) 100 MCG/2ML IJ SOLN
25.0000 ug | INTRAMUSCULAR | Status: DC | PRN
  Administered 2021-07-05: 18:00:00 50 ug via INTRAVENOUS
  Administered 2021-07-05 (×2): 25 ug via INTRAVENOUS

## 2021-07-05 MED ORDER — ONDANSETRON HCL 4 MG/2ML IJ SOLN: 4.0000 mg | Freq: Four times a day (QID) | INTRAMUSCULAR | Status: DC | PRN

## 2021-07-05 MED ORDER — MIDAZOLAM HCL 2 MG/2ML IJ SOLN
INTRAMUSCULAR | Status: DC | PRN
  Administered 2021-07-05: 2 mg via INTRAVENOUS

## 2021-07-05 MED ORDER — DEXMEDETOMIDINE (PRECEDEX) IN NS 20 MCG/5ML (4 MCG/ML) IV SYRINGE
PREFILLED_SYRINGE | INTRAVENOUS | Status: DC | PRN
  Administered 2021-07-05: 8 ug via INTRAVENOUS
  Administered 2021-07-05 (×2): 4 ug via INTRAVENOUS
  Administered 2021-07-05 (×2): 8 ug via INTRAVENOUS

## 2021-07-05 MED ORDER — PREGABALIN 50 MG PO CAPS
100.0000 mg | ORAL_CAPSULE | Freq: Three times a day (TID) | ORAL | Status: DC
  Administered 2021-07-05 – 2021-07-06 (×2): 100 mg via ORAL
  Filled 2021-07-05 (×2): qty 2

## 2021-07-05 MED ORDER — PANTOPRAZOLE SODIUM 40 MG PO TBEC
40.0000 mg | DELAYED_RELEASE_TABLET | Freq: Every day | ORAL | Status: DC
  Administered 2021-07-06: 10:00:00 40 mg via ORAL
  Filled 2021-07-05: qty 1

## 2021-07-05 MED ORDER — LIDOCAINE HCL (PF) 2 % IJ SOLN
INTRAMUSCULAR | Status: AC
  Filled 2021-07-05: qty 5

## 2021-07-05 MED ORDER — CHLORHEXIDINE GLUCONATE 0.12 % MT SOLN
OROMUCOSAL | Status: AC
  Filled 2021-07-05: qty 15

## 2021-07-05 MED ORDER — CELECOXIB 200 MG PO CAPS
ORAL_CAPSULE | ORAL | Status: AC
  Filled 2021-07-05: qty 1

## 2021-07-05 MED ORDER — CHLORHEXIDINE GLUCONATE 0.12 % MT SOLN
15.0000 mL | Freq: Once | OROMUCOSAL | Status: AC
  Administered 2021-07-05: 12:00:00 15 mL via OROMUCOSAL

## 2021-07-05 MED ORDER — ONDANSETRON 4 MG PO TBDP: 4.0000 mg | ORAL_TABLET | Freq: Four times a day (QID) | ORAL | Status: DC | PRN

## 2021-07-05 SURGICAL SUPPLY — 66 items
"PENCIL ELECTRO HAND CTR " (MISCELLANEOUS) ×2 IMPLANT
ADH SKN CLS APL DERMABOND .7 (GAUZE/BANDAGES/DRESSINGS) ×2
APL PRP STRL LF DISP 70% ISPRP (MISCELLANEOUS) ×2
CANNULA REDUC XI 12-8 STAPL (CANNULA) ×3
CANNULA REDUC XI 12-8MM STAPL (CANNULA) ×1
CANNULA REDUCER 12-8 DVNC XI (CANNULA) ×2 IMPLANT
CHLORAPREP W/TINT 26 (MISCELLANEOUS) ×4 IMPLANT
COVER TIP SHEARS 8 DVNC (MISCELLANEOUS) ×2 IMPLANT
COVER TIP SHEARS 8MM DA VINCI (MISCELLANEOUS) ×4
COVER WAND RF STERILE (DRAPES) ×4 IMPLANT
DEFOGGER SCOPE WARMER CLEARIFY (MISCELLANEOUS) ×4 IMPLANT
DERMABOND ADVANCED (GAUZE/BANDAGES/DRESSINGS) ×2
DERMABOND ADVANCED .7 DNX12 (GAUZE/BANDAGES/DRESSINGS) ×2 IMPLANT
DRAPE 3/4 80X56 (DRAPES) ×4 IMPLANT
DRAPE ARM DVNC X/XI (DISPOSABLE) ×6 IMPLANT
DRAPE COLUMN DVNC XI (DISPOSABLE) ×2 IMPLANT
DRAPE DA VINCI XI ARM (DISPOSABLE) ×12
DRAPE DA VINCI XI COLUMN (DISPOSABLE) ×4
ELECT CAUTERY BLADE 6.4 (BLADE) ×6 IMPLANT
ELECT REM PT RETURN 9FT ADLT (ELECTROSURGICAL) ×4
ELECTRODE REM PT RTRN 9FT ADLT (ELECTROSURGICAL) ×2 IMPLANT
GAUZE 4X4 16PLY ~~LOC~~+RFID DBL (SPONGE) ×4 IMPLANT
GLOVE SURG ENC MOIS LTX SZ7 (GLOVE) ×16 IMPLANT
GOWN STRL REUS W/ TWL LRG LVL3 (GOWN DISPOSABLE) ×8 IMPLANT
GOWN STRL REUS W/TWL LRG LVL3 (GOWN DISPOSABLE) ×16
GRASPER SUT TROCAR 14GX15 (MISCELLANEOUS) ×2 IMPLANT
IRRIGATION STRYKERFLOW (MISCELLANEOUS) ×2 IMPLANT
IRRIGATOR STRYKERFLOW (MISCELLANEOUS) ×4
IV NS 1000ML (IV SOLUTION)
IV NS 1000ML BAXH (IV SOLUTION) IMPLANT
KIT PINK PAD W/HEAD ARE REST (MISCELLANEOUS) ×4
KIT PINK PAD W/HEAD ARM REST (MISCELLANEOUS) ×2 IMPLANT
LABEL OR SOLS (LABEL) ×4 IMPLANT
MANIFOLD NEPTUNE II (INSTRUMENTS) ×4 IMPLANT
MESH 3DMAX 4X6 LT LRG (Mesh General) ×2 IMPLANT
MESH 3DMAX 4X6 RT LRG (Mesh General) ×2 IMPLANT
MESH HERNIA 3X4 RECT PHASIX (Mesh General) IMPLANT
MESH HERNIA 7X10 RECT PHASIX (Mesh General) ×2 IMPLANT
NEEDLE HYPO 22GX1.5 SAFETY (NEEDLE) ×4 IMPLANT
OBTURATOR OPTICAL STANDARD 8MM (TROCAR) ×4
OBTURATOR OPTICAL STND 8 DVNC (TROCAR) ×2
OBTURATOR OPTICALSTD 8 DVNC (TROCAR) ×2 IMPLANT
PACK LAP CHOLECYSTECTOMY (MISCELLANEOUS) ×4 IMPLANT
PENCIL ELECTRO HAND CTR (MISCELLANEOUS) ×4 IMPLANT
SEAL CANN UNIV 5-8 DVNC XI (MISCELLANEOUS) ×6 IMPLANT
SEAL XI 5MM-8MM UNIVERSAL (MISCELLANEOUS) ×12
SET TUBE SMOKE EVAC HIGH FLOW (TUBING) ×4 IMPLANT
SOLUTION ELECTROLUBE (MISCELLANEOUS) ×4 IMPLANT
SPONGE T-LAP 18X18 ~~LOC~~+RFID (SPONGE) ×6 IMPLANT
STAPLER CANNULA SEAL DVNC XI (STAPLE) ×2 IMPLANT
STAPLER CANNULA SEAL XI (STAPLE) ×4
SUT ETHIBOND 0 MO6 C/R (SUTURE) ×4 IMPLANT
SUT MNCRL AB 4-0 PS2 18 (SUTURE) ×4 IMPLANT
SUT SILK 2 0 SH CR/8 (SUTURE) ×4 IMPLANT
SUT V-LOC 90 ABS 3-0 VLT  V-20 (SUTURE) ×8
SUT V-LOC 90 ABS 3-0 VLT V-20 (SUTURE) ×4 IMPLANT
SUT VIC AB 2-0 SH 27 (SUTURE) ×8
SUT VIC AB 2-0 SH 27XBRD (SUTURE) ×4 IMPLANT
SUT VIC AB 3-0 SH 27 (SUTURE) ×16
SUT VIC AB 3-0 SH 27X BRD (SUTURE) IMPLANT
SUT VICRYL 0 AB UR-6 (SUTURE) ×10 IMPLANT
SYR 20ML LL LF (SYRINGE) ×4 IMPLANT
SYR 30ML LL (SYRINGE) ×4 IMPLANT
TAPE TRANSPORE STRL 2 31045 (GAUZE/BANDAGES/DRESSINGS) ×4 IMPLANT
TROCAR BALLN GELPORT 12X130M (ENDOMECHANICALS) ×4 IMPLANT
WATER STERILE IRR 500ML POUR (IV SOLUTION) ×4 IMPLANT

## 2021-07-05 NOTE — Anesthesia Postprocedure Evaluation (Signed)
Anesthesia Post Note  Patient: [name]  Procedure(s) Performed: XI ROBOTIC ASSISTED BILATERAL INGUINAL HERNIA WITH MESH (Bilateral: Abdomen) INCISIONAL HERNIA REPAIR WITH MESH, OPEN (Abdomen)  Patient location during evaluation: PACU Anesthesia Type: General Level of consciousness: awake and alert Pain management: pain level controlled Vital Signs Assessment: post-procedure vital signs reviewed and stable Respiratory status: spontaneous breathing, nonlabored ventilation, respiratory function stable and patient connected to nasal cannula oxygen Cardiovascular status: blood pressure returned to baseline and stable Postop Assessment: no apparent nausea or vomiting Anesthetic complications: no   No notable events documented.   Last Vitals:  Vitals:   07/05/21 1847 07/05/21 1948  BP: 124/82 (!) 156/92  Pulse: (!) 110 (!) 104  Resp: 17 16  Temp: 37.1 C (!) 36.4 C  SpO2: 95% 99%    Last Pain:  Vitals:   07/05/21 1953  TempSrc:   PainSc: 6                  Martha Clan

## 2021-07-05 NOTE — Transfer of Care (Signed)
Immediate Anesthesia Transfer of Care Note  Patient: Serafin Grantham Hippert  Procedure(s) Performed: XI ROBOTIC ASSISTED BILATERAL INGUINAL HERNIA WITH MESH (Bilateral: Abdomen) INCISIONAL HERNIA REPAIR WITH MESH, OPEN (Abdomen)  Patient Location: PACU  Anesthesia Type:General  Level of Consciousness: drowsy  Airway & Oxygen Therapy: Patient Spontanous Breathing and Patient connected to face mask oxygen  Post-op Assessment: Report given to RN and Post -op Vital signs reviewed and stable  Post vital signs: Reviewed and stable  Last Vitals:  Vitals Value Taken Time  BP 137/82 07/05/21 1715  Temp    Pulse 103 07/05/21 1716  Resp 22 07/05/21 1716  SpO2 100 % 07/05/21 1716  Vitals shown include unvalidated device data.  Last Pain:  Vitals:   07/05/21 1133  TempSrc: Oral  PainSc: 0-No pain         Complications: No notable events documented.

## 2021-07-05 NOTE — Interval H&P Note (Signed)
History and Physical Interval Note:  07/05/2021 11:54 AM  [name]  has presented today for surgery, with the diagnosis of bilaternal inguinal hernias, incisional hernia.  The various methods of treatment have been discussed with the patient and family. After consideration of risks, benefits and other options for treatment, the patient has consented to  Procedure(s): XI ROBOTIC ASSISTED BILATERAL INGUINAL HERNIA (Bilateral) [place], open (N/A) as a surgical intervention.  The patient's history has been reviewed, patient examined, no change in status, stable for surgery.  I have reviewed the patient's chart and labs.  Questions were answered to the patient's satisfaction.     [place]

## 2021-07-05 NOTE — Anesthesia Preprocedure Evaluation (Signed)
Anesthesia Evaluation  Patient identified by MRN, date of birth, ID band Patient awake    Reviewed: Allergy & Precautions, NPO status   Airway Mallampati: II  TM Distance: >3 FB     Dental  (+) Poor Dentition, Missing   Pulmonary COPD, Current SmokerPatient did not abstain from smoking.,    Pulmonary exam normal        Cardiovascular hypertension, Pt. on medications  Rhythm:Regular Rate:Normal     Neuro/Psych TIACVA, No Residual Symptoms    GI/Hepatic GERD  Medicated and Controlled,  Endo/Other    Renal/GU      Musculoskeletal  (+) Arthritis ,   Abdominal   Peds  Hematology   Anesthesia Other Findings Acid reflux    Alcohol abuse    Arthritis  "left pinky" (10/17/2014)  COPD (chronic obstructive pulmonary disease) (HCC) DDD (degenerative disc disease), lumbar   Gastroenteritis    History of stress test 2001 no ischemia  Hypertension    Inguinal hernia    Ribs, multiple fractures 12/2018   TIA (transient ischemic attack) 08/22/2017 (also 04/08/18) No deficits  Tobacco abuse    Vertebral compression fracture (HCC)       Reproductive/Obstetrics                            Anesthesia Physical  Anesthesia Plan  ASA: 3  Anesthesia Plan: General   Post-op Pain Management:    Induction: Intravenous  PONV Risk Score and Plan: 2 and Propofol infusion, Treatment may vary due to age or medical condition, Ondansetron and Midazolam  Airway Management Planned: Oral ETT  Additional Equipment:   Intra-op Plan:   Post-operative Plan:   Informed Consent: I have reviewed the patients History and Physical, chart, labs and discussed the procedure including the risks, benefits and alternatives for the proposed anesthesia with the patient or authorized representative who has indicated his/her understanding and acceptance.       Plan Discussed with: CRNA, Anesthesiologist and  Surgeon  Anesthesia Plan Comments:        Anesthesia Quick Evaluation

## 2021-07-05 NOTE — Op Note (Signed)
Robotic assisted Laparoscopic Transabdominal Bilateral Inguinal Hernia Repair with 3 D large Mesh BARD Open repair recurrent incisional hernia via different incision using Phasix ST mesh 10x3 cms      Pre-operative Diagnosis:  Recurrent incisional hernia, Bilateral Inguinal Hernia   Post-operative Diagnosis: Same   Procedure: Robotic  Laparoscopic  repair of Bilateral inguinal hernias w mesh Repair incisional hernia w pahsix ST   Surgeon: Caroleen Hamman, MD FACS   Anesthesia: Gen. with endotracheal tube   Findings: inguinal hernias , clean bowel injury promptly recognized and repaired.       Procedure Details  The patient was seen again in the Holding Room. The benefits, complications, treatment options, and expected outcomes were discussed with the patient. The risks of bleeding, infection, recurrence of symptoms, failure to resolve symptoms, recurrence of hernia, ischemic orchitis, chronic pain syndrome or neuroma, were discussed again. The likelihood of improving the patient's symptoms with return to their baseline status is good.  The patient and/or family concurred with the proposed plan, giving informed consent.  The patient was taken to Operating Room, identified  and the procedure verified as Laparoscopic Inguinal Hernia Repair. Laterality confirmed.  A Time Out was held and the above information confirmed.   Prior to the induction of general anesthesia, antibiotic prophylaxis was administered. VTE prophylaxis was in place. General endotracheal anesthesia was then administered and tolerated well. After the induction, the abdomen was prepped with Chloraprep and draped in the sterile fashion. The patient was positioned in the supine position.   I started with an infraumbilical incision where the recurrent incisional hernia was.  Cutdown technique was used and the defect measured approximately 3 and half centimeters.  There was 2 Husser bowel to the undersurface of the fascia.  Extensive  lysis of adhesion was performed with Metzenbaum scissors .  I did have significant issues mobilizing the bowel and I was able to finger fracture some of the adhesions.  Despite my best effort to avoid any bowel injury it was impossible not to create an enterotomy due to the nature of his adhesive disease.  Basically the bowel was fused to some of the edges of the fascia.  I was able to properly identify an enterotomy measuring 2 and half centimeters.  This was a clean laceration with minimal contamination.  I also saw that the edges of the bowel were very viable and since I did not use any energy it was amenable for primary repair.  I did 2 layer repair of the bowel using 3-0 Vicryl as an initial Connel layer followed by interrupted Lembert sutures using 2-0 silk's we checked the patency of the repair and the lumen it was widely patent without evidence of any leak.  At this point I was able to complete my lysis of adhesions to be able to place the robotic ports.   3 robotic ports were placed under direct visualization in the standard fashion. I temporized the incisional defect with multiple Vicryl sutures to be able to obtain a good seal laparoscopically.   Pneumoperitoneum obtained w/o HD changes. No evidence of any further bowel injuries. The laparoscopy revealed large indirect defects. I inserted the needles and the mesh. The robot was brought ot the table and docked in the standard fashion, no collision between arms was observed. Instruments were kept under direct view at all times. We started on the right side were a flap was created. The sac was reduced and dissected free from adjacent structures. We preserved the vas and  the vessels. Once dissection was completed a large 3D mesh was placed and secured with two interrupted vicryl attached to the pubic tubercle. There was good coverage of the direct, indirect and femoral spaces. The flap was closed with v lock suture.   Attention then was turned to  the left side were a flap was created. The sac was reduced and dissected free from adjacent structures. We preserved the vas and the vessels. Once dissection was completed a large 3D mesh was placed and secured with two interrupted vicryl attached to the pubic tubercle. There was good coverage of the direct, indirect and femoral spaces. The flap was closed with v lock suture. Second look revealed no complications or injuries. I decided to place permanent sutures along the inguinal region since I thought that there was minimal contamination and thiswas protected from bowel. At this point I decided to place Exparel throughout the abdominal cavity and inserted the phasic's ST mesh 10 x 7 cm and did a repair with multiple Ethibond sutures circumferentially using a PMI suture.  There was good overlap from the defect to the edge of the mesh.  I was very happy with how the mesh laid.  There was no kinks and there was no evidence of any bowel sneaking around the mesh.  Remaining other defect was closed with multiple interrupted 0 Ethibond sutures in the standard fashion.  Subcutaneous tissue was closed in a 2 layer fashion with 2 and 3-0 Vicryl and the skin incisions were closed with Monocryl.     Once assuring that hemostasis was adequate the ports were removed and a figure-of-eight 0 Vicryl suture was placed at the fascial edges. 4-0 subcuticular Monocryl was used at all skin edges. Dermabond was placed.  Patient tolerated the procedure well. There were no complications. He was taken to the recovery room in stable condition.                 Caroleen Hamman, MD, FACS

## 2021-07-05 NOTE — Anesthesia Procedure Notes (Signed)
Procedure Name: Intubation Date/Time: 07/05/2021 12:40 PM Performed by: Tollie Eth, CRNA Pre-anesthesia Checklist: Patient identified, Patient being monitored, Timeout performed, Emergency Drugs available and Suction available Patient Re-evaluated:Patient Re-evaluated prior to induction Oxygen Delivery Method: Circle system utilized Preoxygenation: Pre-oxygenation with 100% oxygen Induction Type: IV induction Ventilation: Mask ventilation without difficulty Laryngoscope Size: McGraph and 4 Grade View: Grade I Tube type: Oral Tube size: 7.5 mm Number of attempts: 1 Airway Equipment and Method: Stylet Placement Confirmation: ETT inserted through vocal cords under direct vision, positive ETCO2 and breath sounds checked- equal and bilateral Secured at: 22 cm Tube secured with: Tape Dental Injury: Teeth and Oropharynx as per pre-operative assessment

## 2021-07-06 ENCOUNTER — Encounter: Payer: Self-pay | Admitting: Surgery

## 2021-07-06 DIAGNOSIS — K4020 Bilateral inguinal hernia, without obstruction or gangrene, not specified as recurrent: Secondary | ICD-10-CM | POA: Diagnosis not present

## 2021-07-06 LAB — HIV ANTIBODY (ROUTINE TESTING W REFLEX): HIV Screen 4th Generation wRfx: NONREACTIVE

## 2021-07-06 MED ORDER — IBUPROFEN 600 MG PO TABS: 600.0000 mg | ORAL_TABLET | Freq: Four times a day (QID) | ORAL | 0 refills | Status: DC | PRN

## 2021-07-06 MED ORDER — OXYCODONE HCL 5 MG PO TABS: 5.0000 mg | ORAL_TABLET | Freq: Four times a day (QID) | ORAL | 0 refills | Status: DC | PRN

## 2021-07-06 MED ORDER — PREGABALIN 100 MG PO CAPS: 100.0000 mg | ORAL_CAPSULE | Freq: Three times a day (TID) | ORAL | 0 refills | Status: DC

## 2021-07-06 NOTE — Discharge Summary (Signed)
[facility] SURGICAL DISCHARGE SUMMARY  Patient ID: [name] MRN: [id number] DOB/AGE: Jan 20, 1956 65 y.o.  Admit date: 07/05/2021 Discharge date: 07/06/2021  Discharge Diagnoses Patient Active Problem List   Diagnosis Date Noted   S/P recurrent ventral herniorrhaphy 07/05/2021    Consultants None  Procedures 07/05/2021:  1) Robotic assisted Laparoscopic Transabdominal Bilateral Inguinal Hernia Repair with 3 D large Mesh BARD 2) Open repair recurrent incisional hernia via different incision using Phasix ST mesh 10x3 cms  HPI: [name] is a 65 year old male with bilateral inguinal hernias and incisional hernias who presents to [facility] on 07/05/2021 for scheduled repair.  Hospital Course: Informed consent was obtained and documented, and patient underwent uneventful robotic assisted laparoscopic bilateral inguinal hernia repair and incisional hernia repair (Dr Dahlia Byes, 07/05/2021).  Post-operatively, patient did well and advancement of patient's diet and ambulation were well-tolerated. The remainder of patient's hospital course was essentially unremarkable, and discharge planning was initiated accordingly with patient safely able to be discharged home with appropriate discharge instructions, pain control, and outpatient follow-up after all of his questions were answered to his expressed satisfaction.  Discharge Condition: Good   Physical Examination:  Constitutional: Well appearing male, NAD Pulmonary: Normal effort, no respiratory distress Gastrointestinal: Soft, non-tender, mild distension, no rebound/guarding Skin: Laparoscopic incisions are CDI with dermabond, no erythema or drainage    Allergies as of 07/06/2021       Reactions   Penicillins Anaphylaxis   Has patient had a PCN reaction causing immediate rash, facial/tongue/throat swelling, SOB or lightheadedness with hypotension: Yes Has patient had a PCN reaction causing severe  rash involving mucus membranes or skin necrosis: No Has patient had a PCN reaction that required hospitalization Yes Has patient had a PCN reaction occurring within the last 10 years: No If all of the above answers are "NO", then may proceed with Cephalosporin use.   Morphine And Related Itching        Medication List     TAKE these medications    albuterol 108 (90 Base) MCG/ACT inhaler Commonly known as: VENTOLIN HFA Inhale 2 puffs into the lungs every 6 (six) hours as needed for wheezing or shortness of breath.   amLODipine 5 MG tablet Commonly known as: NORVASC Take 1 tablet (5 mg total) by mouth daily.   ibuprofen 600 MG tablet Commonly known as: ADVIL Take 1 tablet (600 mg total) by mouth every 6 (six) hours as needed.   omeprazole 20 MG capsule Commonly known as: PRILOSEC Take 20 mg by mouth daily.   oxyCODONE 5 MG immediate release tablet Commonly known as: Oxy IR/ROXICODONE Take 1 tablet (5 mg total) by mouth every 6 (six) hours as needed for severe pain or breakthrough pain.   pregabalin 100 MG capsule Commonly known as: LYRICA Take 1 capsule (100 mg total) by mouth 3 (three) times daily for 14 days.          Follow-up Information     Pabon, [place] F, MD. Schedule an appointment as soon as possible for a visit in 2 week(s).   Specialty: General Surgery Why: s/p ventral hernia and bilateral inguinal hernia repairs Contact information: [address] [place]  [address] [phone]                  Time spent on discharge management including discussion of hospital course, clinical condition, outpatient instructions, prescriptions, and follow up with the patient and members of the medical team: >30 minutes  -- Edison Simon ,  [name] [facility]  07/06/2021, 8:51 AM [phone] M-F: 7am - 4pm

## 2021-07-06 NOTE — TOC Initial Note (Signed)
Transition of Care [facility]) - Initial/Assessment Note    Patient Details  Name: [name] MRN: [id number] Date of Birth: 10/13/1955  Transition of Care [facility]) CM/SW Contact:    Beverly Sessions, RN Phone Number: 07/06/2021, 9:40 AM  Clinical Narrative:                   Transition of Care [facility]) Screening Note   Patient Details  Name: [name] Date of Birth: 08-Apr-1956   Transition of Care [facility]) CM/SW Contact:    Beverly Sessions, RN Phone Number: 07/06/2021, 9:41 AM    Transition of Care Department [facility]) has reviewed patient and no TOC needs have been identified at this time. We will continue to monitor patient advancement through interdisciplinary progression rounds. If new patient transition needs arise, please place a TOC consult.         Patient Goals and CMS Choice        Expected Discharge Plan and Services           Expected Discharge Date: 07/06/21                                    Prior Living Arrangements/Services                       Activities of Daily Living Home Assistive Devices/Equipment: None ADL Screening (condition at time of admission) Patient's cognitive ability adequate to safely complete daily activities?: No Is the patient deaf or have difficulty hearing?: No Does the patient have difficulty seeing, even when wearing glasses/contacts?: No Does the patient have difficulty concentrating, remembering, or making decisions?: No Patient able to express need for assistance with ADLs?: Yes Does the patient have difficulty dressing or bathing?: No Independently performs ADLs?: Yes (appropriate for developmental age) Does the patient have difficulty walking or climbing stairs?: No Weakness of Legs: None Weakness of Arms/Hands: None  Permission Sought/Granted                  Emotional Assessment              Admission diagnosis:  S/P recurrent ventral herniorrhaphy [V03.500,  Z87.19] Patient Active Problem List   Diagnosis Date Noted   S/P recurrent ventral herniorrhaphy 07/05/2021   Thickened small bowel    Adenomatous polyp of transverse colon    Multiple rib fractures 12/28/2018   Rib fractures 12/25/2018   Acute CVA (cerebrovascular accident) ([place]) 04/08/2018   TIA (transient ischemic attack) 08/22/2017   Bilateral recurrent inguinal hernia without obstruction or gangrene    Influenza 10/18/2014   Hypoxia 10/16/2014   Acid reflux    DDD (degenerative disc disease), lumbar    Hypertension    Tobacco abuse    Alcohol abuse    Essential hypertension    PCP:  Cletis Athens, MD Pharmacy:   [id number] - [address] [place] - [address] [place] [address] Phone: [phone] Fax: [phone]  [facility] [address] [place] [address] Phone: [phone] Fax: [phone]     Social Determinants of Health (SDOH) Interventions    Readmission Risk Interventions Readmission Risk Prevention Plan 12/28/2018 12/28/2018  Post Dischage Appt Complete Complete  Medication Screening Complete Complete  Transportation Screening Complete Complete  Some recent data might be hidden

## 2021-07-06 NOTE — Plan of Care (Signed)
  Problem: Education: Goal: Knowledge of General Education information will improve Description: Including pain rating scale, medication(s)/side effects and non-pharmacologic comfort measures Outcome: Progressing   Problem: Clinical Measurements: Goal: Ability to maintain clinical measurements within normal limits will improve Outcome: Progressing   

## 2021-07-06 NOTE — Progress Notes (Signed)
[name] to be D/C'd Home per MD order.  Discussed prescriptions and follow up appointments with the patient. Prescriptions given to patient, medication list explained in detail. Pt verbalized understanding.  Allergies as of 07/06/2021       Reactions   Penicillins Anaphylaxis   Has patient had a PCN reaction causing immediate rash, facial/tongue/throat swelling, SOB or lightheadedness with hypotension: Yes Has patient had a PCN reaction causing severe rash involving mucus membranes or skin necrosis: No Has patient had a PCN reaction that required hospitalization Yes Has patient had a PCN reaction occurring within the last 10 years: No If all of the above answers are "NO", then may proceed with Cephalosporin use.   Morphine And Related Itching        Medication List     TAKE these medications    albuterol 108 (90 Base) MCG/ACT inhaler Commonly known as: VENTOLIN HFA Inhale 2 puffs into the lungs every 6 (six) hours as needed for wheezing or shortness of breath.   amLODipine 5 MG tablet Commonly known as: NORVASC Take 1 tablet (5 mg total) by mouth daily.   ibuprofen 600 MG tablet Commonly known as: ADVIL Take 1 tablet (600 mg total) by mouth every 6 (six) hours as needed.   omeprazole 20 MG capsule Commonly known as: PRILOSEC Take 20 mg by mouth daily.   oxyCODONE 5 MG immediate release tablet Commonly known as: Oxy IR/ROXICODONE Take 1 tablet (5 mg total) by mouth every 6 (six) hours as needed for severe pain or breakthrough pain.   pregabalin 100 MG capsule Commonly known as: LYRICA Take 1 capsule (100 mg total) by mouth 3 (three) times daily for 14 days.        Vitals:   07/06/21 0335 07/06/21 0736  BP: 116/87 127/83  Pulse: 94 95  Resp: 18 18  Temp: 97.8 F (36.6 C) 98.2 F (36.8 C)  SpO2: 100% 99%    Skin clean, dry and intact without evidence of skin break down, no evidence of skin tears noted. IV catheter discontinued intact. Site  without signs and symptoms of complications. Dressing and pressure applied. Pt denies pain at this time. No complaints noted.  An After Visit Summary was printed and given to the patient. Patient escorted via [place], and D/C home via private auto.  [name]

## 2021-07-06 NOTE — Discharge Instructions (Signed)
In addition to included general post-operative instructions,  Diet: Resume home diet.   Activity: No heavy lifting >20 pounds (children, pets, laundry, garbage) or strenuous activity for 6 weeks, but light activity and walking are encouraged. Do not drive or drink alcohol if taking narcotic pain medications or having pain that might distract from driving.  Wound care: 2 days after surgery (12/31), you may shower/get incision wet with soapy water and pat dry (do not rub incisions), but no baths or submerging incision underwater until follow-up.   Medications: Resume all home medications. For mild to moderate pain: acetaminophen (Tylenol) or ibuprofen/naproxen (if no kidney disease). Combining Tylenol with alcohol can substantially increase your risk of causing liver disease. Narcotic pain medications, if prescribed, can be used for severe pain, though may cause nausea, constipation, and drowsiness. Do not combine Tylenol and Percocet (or similar) within a 6 hour period as Percocet (and similar) contain(s) Tylenol. If you do not need the narcotic pain medication, you do not need to fill the prescription.  Call office [phone] / [phone]) at any time if any questions, worsening pain, fevers/chills, bleeding, drainage from incision site, or other concerns.

## 2021-07-10 LAB — SURGICAL PATHOLOGY

## 2021-07-18 ENCOUNTER — Ambulatory Visit (INDEPENDENT_AMBULATORY_CARE_PROVIDER_SITE_OTHER): Payer: Self-pay | Admitting: Surgery

## 2021-07-18 ENCOUNTER — Encounter: Payer: Self-pay | Admitting: Surgery

## 2021-07-18 ENCOUNTER — Other Ambulatory Visit: Payer: Self-pay

## 2021-07-18 VITALS — BP 151/88 | HR 114 | Temp 98.6°F | Ht 71.0 in | Wt 223.4 lb

## 2021-07-18 DIAGNOSIS — Z09 Encounter for follow-up examination after completed treatment for conditions other than malignant neoplasm: Secondary | ICD-10-CM

## 2021-07-18 DIAGNOSIS — K432 Incisional hernia without obstruction or gangrene: Secondary | ICD-10-CM

## 2021-07-18 NOTE — Patient Instructions (Addendum)

## 2021-07-20 NOTE — Progress Notes (Signed)
[name] 66 year old male following robotic bilateral inguinal hernia.  Very hostile abdomen with a small enterotomy that was promptly repair.  He also had an additional incisional hernia repair with phasic's ST.  He is doing very well.  Tolerating diet.  No fevers no chills.  Having bowel movements. He is walking well  PE NAD Abd: Patient is healing well without evidence of infection.  No evidence of recurrence.  Some postoperative changes around the umbilicus where the ventral hernia was.  Those are expected   A/P doing well.  No complications I will see him back in a couple months.

## 2021-08-02 ENCOUNTER — Telehealth: Payer: Self-pay | Admitting: Surgery

## 2021-08-02 NOTE — Telephone Encounter (Signed)
Patient had bilateral inguinal hernia surgery done on 07/05/21 with Dr. Dahlia Byes.  Patient states that since his last follow up he has been having increased inflammation at the vas deferen, right side, sore to touch.  No redness, fever, nausea or vomiting.  Patient wondering if he needs an antibiotic or another follow up.  Please call patient. Thank you.

## 2021-08-02 NOTE — Telephone Encounter (Signed)
07/05/21 Bilateral Inguinal hernia repair- Dr.Pabon- patient states right side scrotum-vas deferens- feels like there may be some inflammation -denies sharp shooting stabbing pain- denies difficulty with urinating-incisions look well -denies redness or drainage- tenderness but no pain- discussed trying ibuprofen 200 mg-letting him know he can take three to four tablets every 6-8 hours-try this for the next few days and through the weekend-if no better call Monday and we will work him in to be seen.

## 2021-10-17 ENCOUNTER — Ambulatory Visit: Payer: Self-pay | Admitting: Surgery

## 2021-11-26 ENCOUNTER — Encounter: Payer: Self-pay | Admitting: Surgery

## 2021-11-26 ENCOUNTER — Ambulatory Visit (INDEPENDENT_AMBULATORY_CARE_PROVIDER_SITE_OTHER): Payer: Medicare Other | Admitting: Surgery

## 2021-11-26 ENCOUNTER — Other Ambulatory Visit: Payer: Self-pay

## 2021-11-26 ENCOUNTER — Telehealth: Payer: Self-pay | Admitting: Surgery

## 2021-11-26 VITALS — BP 135/83 | HR 111 | Temp 98.4°F | Ht 71.0 in | Wt 220.8 lb

## 2021-11-26 DIAGNOSIS — K4091 Unilateral inguinal hernia, without obstruction or gangrene, recurrent: Secondary | ICD-10-CM | POA: Diagnosis not present

## 2021-11-26 NOTE — H&P (View-Only) (Signed)
Outpatient Surgical Follow Up  11/26/2021  [name] is an 66 y.o. male.   Chief Complaint  Patient presents with   Follow-up    Ventral hernia 2022 5 mo f/u    HPI: [name] is a 66 year old male well-known to me with prior history of recurrent ventral hernias with a very hostile abdomen.  I did perform ventral hernia repair as well as bilaterally hernias December 2022.  He now has some bulging on the left inguinal region.  He also experiences some intermittent pain that is mild to moderate intensity and sharp.  Seems to be worsened with Valsalva.  He is otherwise doing okay and has no other issues.  Past Medical History:  Diagnosis Date   Acid reflux    Alcohol abuse    Arthritis    "left pinky" (10/17/2014)   COPD (chronic obstructive pulmonary disease) (HCC)    DDD (degenerative disc disease), lumbar    Gastroenteritis    History of stress test 2001   no ischemia   Hypertension    Inguinal hernia    Ribs, multiple fractures 12/2018   TIA (transient ischemic attack) 08/22/2017   (also 04/08/18)  No deficits   Tobacco abuse    Vertebral compression fracture (HCC)    T11, L1    Past Surgical History:  Procedure Laterality Date   BIOPSY N/A 06/28/2021   Procedure: BIOPSY;  Surgeon: Lin Landsman, MD;  Location: [place];  Service: Endoscopy;  Laterality: N/A;   COLONOSCOPY WITH PROPOFOL N/A 06/28/2021   Procedure: COLONOSCOPY WITH PROPOFOL;  Surgeon: Lin Landsman, MD;  Location: [place];  Service: Endoscopy;  Laterality: N/A;   ESOPHAGOGASTRODUODENOSCOPY (EGD) WITH PROPOFOL N/A 06/28/2021   Procedure: ESOPHAGOGASTRODUODENOSCOPY (EGD) WITH PROPOFOL;  Surgeon: Lin Landsman, MD;  Location: [place];  Service: Endoscopy;  Laterality: N/A;   EXPLORATORY LAPAROTOMY W/ BOWEL RESECTION  03/09/1979   INCISIONAL HERNIA REPAIR N/A 07/05/2021   Procedure: INCISIONAL HERNIA REPAIR WITH MESH, OPEN;  Surgeon: Jules Husbands,  MD;  Location: [facility];  Service: General;  Laterality: N/A;   POLYPECTOMY N/A 06/28/2021   Procedure: POLYPECTOMY;  Surgeon: Lin Landsman, MD;  Location: [place];  Service: Endoscopy;  Laterality: N/A;    Family History  Problem Relation Age of Onset   Kidney disease Mother    Diabetes Mother    Hypertension Mother    Heart disease Father    Cancer Father    Stroke Father    Heart failure Father    Heart failure Other     Social History:  reports that he has been smoking cigarettes. He has a 13.50 pack-year smoking history. He has never used smokeless tobacco. He reports current alcohol use of about 7.0 standard drinks per week. He reports that he does not currently use drugs after having used the following drugs: Marijuana.  Allergies:  Allergies  Allergen Reactions   Penicillins Anaphylaxis    Has patient had a PCN reaction causing immediate rash, facial/tongue/throat swelling, SOB or lightheadedness with hypotension: Yes Has patient had a PCN reaction causing severe rash involving mucus membranes or skin necrosis: No Has patient had a PCN reaction that required hospitalization Yes Has patient had a PCN reaction occurring within the last 10 years: No If all of the above answers are "NO", then may proceed with Cephalosporin use.   Morphine And Related Itching    Medications reviewed.    ROS Full ROS performed and is otherwise  negative other than what is stated in HPI   BP 135/83   Pulse (!) 111   Temp 98.4 F (36.9 C) (Oral)   Ht '5\' 11"'$  (1.803 m)   Wt 220 lb 12.8 oz (100.2 kg)   SpO2 96%   BMI 30.80 kg/m   Physical Exam Vitals and nursing note reviewed. Exam conducted with a chaperone present.  Constitutional:      General: He is not in acute distress.    Appearance: Normal appearance. He is normal weight.  Cardiovascular:     Rate and Rhythm: Normal rate and regular rhythm.  Pulmonary:     Effort: Pulmonary effort is normal. No  respiratory distress.     Breath sounds: Normal breath sounds. No stridor. No wheezing or rhonchi.  Abdominal:     General: There is no distension.     Palpations: There is no mass.     Tenderness: There is no abdominal tenderness. There is no guarding or rebound.     Hernia: No hernia is present.     Comments: Previous healed abdominal incisions.  No evidence of ventral hernia recurrences.  There is evidence of a left inguinal hernia that is recurrent.  This is reducible  Musculoskeletal:        General: Normal range of motion.     Cervical back: Normal range of motion and neck supple. No rigidity or tenderness.  Skin:    General: Skin is warm.  Neurological:     General: No focal deficit present.     Mental Status: He is alert and oriented to person, place, and time.  Psychiatric:        Mood and Affect: Mood normal.        Behavior: Behavior normal.        Thought Content: Thought content normal.        Judgment: Judgment normal.      Assessment/Plan: 66 year old male with now recurrent left inguinal hernia.  His abdomen is very hostile and I will like to avoid any transabdominal approaches.  I do think the best approach for him will be an open left inguinal hernia repair.  Procedure discussed with the patient in detail.  Risks, benefits and possible implications including but not limited to: Bleeding, infection injury to adjacent structures.  He understands and wished to proceed. We will schedule him in about 10 days as OR availability allows I spent 40 minutes in this encounter including personally reviewing imaging studies, placing orders, coordinating her care, counseling the patient and performing appropriate documentation.    Caroleen Hamman, MD [facility] General Surgeon

## 2021-11-26 NOTE — Progress Notes (Signed)
Outpatient Surgical Follow Up  11/26/2021  [name] is an 66 y.o. male.   Chief Complaint  Patient presents with   Follow-up    Ventral hernia 2022 5 mo f/u    HPI: [name] is a 66 year old male well-known to me with prior history of recurrent ventral hernias with a very hostile abdomen.  I did perform ventral hernia repair as well as bilaterally hernias December 2022.  He now has some bulging on the left inguinal region.  He also experiences some intermittent pain that is mild to moderate intensity and sharp.  Seems to be worsened with Valsalva.  He is otherwise doing okay and has no other issues.  Past Medical History:  Diagnosis Date   Acid reflux    Alcohol abuse    Arthritis    "left pinky" (10/17/2014)   COPD (chronic obstructive pulmonary disease) (HCC)    DDD (degenerative disc disease), lumbar    Gastroenteritis    History of stress test 2001   no ischemia   Hypertension    Inguinal hernia    Ribs, multiple fractures 12/2018   TIA (transient ischemic attack) 08/22/2017   (also 04/08/18)  No deficits   Tobacco abuse    Vertebral compression fracture (HCC)    T11, L1    Past Surgical History:  Procedure Laterality Date   BIOPSY N/A 06/28/2021   Procedure: BIOPSY;  Surgeon: Lin Landsman, MD;  Location: [place];  Service: Endoscopy;  Laterality: N/A;   COLONOSCOPY WITH PROPOFOL N/A 06/28/2021   Procedure: COLONOSCOPY WITH PROPOFOL;  Surgeon: Lin Landsman, MD;  Location: [place];  Service: Endoscopy;  Laterality: N/A;   ESOPHAGOGASTRODUODENOSCOPY (EGD) WITH PROPOFOL N/A 06/28/2021   Procedure: ESOPHAGOGASTRODUODENOSCOPY (EGD) WITH PROPOFOL;  Surgeon: Lin Landsman, MD;  Location: [place];  Service: Endoscopy;  Laterality: N/A;   EXPLORATORY LAPAROTOMY W/ BOWEL RESECTION  03/09/1979   INCISIONAL HERNIA REPAIR N/A 07/05/2021   Procedure: INCISIONAL HERNIA REPAIR WITH MESH, OPEN;  Surgeon: Jules Husbands,  MD;  Location: [facility];  Service: General;  Laterality: N/A;   POLYPECTOMY N/A 06/28/2021   Procedure: POLYPECTOMY;  Surgeon: Lin Landsman, MD;  Location: [place];  Service: Endoscopy;  Laterality: N/A;    Family History  Problem Relation Age of Onset   Kidney disease Mother    Diabetes Mother    Hypertension Mother    Heart disease Father    Cancer Father    Stroke Father    Heart failure Father    Heart failure Other     Social History:  reports that he has been smoking cigarettes. He has a 13.50 pack-year smoking history. He has never used smokeless tobacco. He reports current alcohol use of about 7.0 standard drinks per week. He reports that he does not currently use drugs after having used the following drugs: Marijuana.  Allergies:  Allergies  Allergen Reactions   Penicillins Anaphylaxis    Has patient had a PCN reaction causing immediate rash, facial/tongue/throat swelling, SOB or lightheadedness with hypotension: Yes Has patient had a PCN reaction causing severe rash involving mucus membranes or skin necrosis: No Has patient had a PCN reaction that required hospitalization Yes Has patient had a PCN reaction occurring within the last 10 years: No If all of the above answers are "NO", then may proceed with Cephalosporin use.   Morphine And Related Itching    Medications reviewed.    ROS Full ROS performed and is otherwise  negative other than what is stated in HPI   BP 135/83   Pulse (!) 111   Temp 98.4 F (36.9 C) (Oral)   Ht '5\' 11"'$  (1.803 m)   Wt 220 lb 12.8 oz (100.2 kg)   SpO2 96%   BMI 30.80 kg/m   Physical Exam Vitals and nursing note reviewed. Exam conducted with a chaperone present.  Constitutional:      General: He is not in acute distress.    Appearance: Normal appearance. He is normal weight.  Cardiovascular:     Rate and Rhythm: Normal rate and regular rhythm.  Pulmonary:     Effort: Pulmonary effort is normal. No  respiratory distress.     Breath sounds: Normal breath sounds. No stridor. No wheezing or rhonchi.  Abdominal:     General: There is no distension.     Palpations: There is no mass.     Tenderness: There is no abdominal tenderness. There is no guarding or rebound.     Hernia: No hernia is present.     Comments: Previous healed abdominal incisions.  No evidence of ventral hernia recurrences.  There is evidence of a left inguinal hernia that is recurrent.  This is reducible  Musculoskeletal:        General: Normal range of motion.     Cervical back: Normal range of motion and neck supple. No rigidity or tenderness.  Skin:    General: Skin is warm.  Neurological:     General: No focal deficit present.     Mental Status: He is alert and oriented to person, place, and time.  Psychiatric:        Mood and Affect: Mood normal.        Behavior: Behavior normal.        Thought Content: Thought content normal.        Judgment: Judgment normal.      Assessment/Plan: 66 year old male with now recurrent left inguinal hernia.  His abdomen is very hostile and I will like to avoid any transabdominal approaches.  I do think the best approach for him will be an open left inguinal hernia repair.  Procedure discussed with the patient in detail.  Risks, benefits and possible implications including but not limited to: Bleeding, infection injury to adjacent structures.  He understands and wished to proceed. We will schedule him in about 10 days as OR availability allows I spent 40 minutes in this encounter including personally reviewing imaging studies, placing orders, coordinating her care, counseling the patient and performing appropriate documentation.    Caroleen Hamman, MD [facility] General Surgeon

## 2021-11-26 NOTE — Patient Instructions (Signed)
Our surgery scheduler will call you within 24-48 hours to schedule your surgery. Please have the Blue surgery sheet available when speaking with her.    Ventral Hernia  A ventral hernia is a bulge of tissue from inside the abdomen that pushes through a weak area of the muscles that form the front wall of the abdomen. The tissues inside the abdomen are inside a sac (peritoneum). These tissues include the small intestine, large intestine, and the fatty tissue that covers the intestines (omentum). Sometimes, the bulge that forms a hernia contains intestines. Other hernias contain only fat. Ventral hernias do not go away without surgical treatment. There are several types of ventral hernias. You may have: A hernia at an incision site from previous abdominal surgery (incisional hernia). A hernia just above the belly button (epigastric hernia), or at the belly button (umbilical hernia). These types of hernias can develop from heavy lifting or straining. A hernia that comes and goes (reducible hernia). It may be visible only when you lift or strain. This type of hernia can be pushed back into the abdomen (reduced). A hernia that traps abdominal tissue inside the hernia (incarcerated hernia). This type of hernia does not reduce. A hernia that cuts off blood flow to the tissues inside the hernia (strangulated hernia). The tissues can start to die if this happens. This is a very painful bulge that cannot be reduced. A strangulated hernia is a medical emergency. What are the causes? This condition is caused by abdominal tissue putting pressure on an area of weakness in the abdominal muscles. What increases the risk? The following factors may make you more likely to develop this condition: Being age 60 or older. Being overweight or obese. Having had previous abdominal surgery, especially if there was an infection after surgery. Having had an injury to the abdominal wall. Frequently lifting or pushing heavy  objects. Having had several pregnancies. Having a buildup of fluid inside the abdomen (ascites). Straining to have a bowel movement or to urinate. Having frequent coughing episodes. What are the signs or symptoms? The only symptom of a ventral hernia may be a painless bulge in the abdomen. A reducible hernia may be visible only when you strain, cough, or lift. Other symptoms may include: Dull pain. A feeling of pressure. Signs and symptoms of a strangulated hernia may include: Increasing pain. Nausea and vomiting. Pain when pressing on the hernia. The skin over the hernia turning red or purple. Constipation. Blood in the stool (feces). How is this diagnosed? This condition may be diagnosed based on: Your symptoms. Your medical history. A physical exam. You may be asked to cough or strain while standing. These actions increase the pressure inside your abdomen and force the hernia through the opening in your muscles. Your health care provider may try to reduce the hernia by gently pushing the hernia back in. Imaging studies, such as an ultrasound or CT scan. How is this treated? This condition is treated with surgery. If you have a strangulated hernia, surgery is done as soon as possible. If your hernia is small and not incarcerated, you may be asked to lose some weight before surgery. Follow these instructions at home: Follow instructions from your health care provider about eating or drinking restrictions. If you are overweight, your health care provider may recommend that you increase your activity level and eat a healthier diet. Do not lift anything that is heavier than 10 lb (4.5 kg), or the limit that you are told, until your   health care provider says that it is safe. Return to your normal activities as told by your health care provider. Ask your health care provider what activities are safe for you. You may need to avoid activities that increase pressure on your hernia. Take  over-the-counter and prescription medicines only as told by your health care provider. Keep all follow-up visits. This is important. Contact a health care provider if: Your hernia gets larger. Your hernia becomes painful. Get help right away if: Your hernia becomes increasingly painful. You have pain along with any of the following: Changes in skin color in the area of the hernia. Nausea. Vomiting. Fever. These symptoms may represent a serious problem that is an emergency. Do not wait to see if the symptoms will go away. Get medical help right away. Call your local emergency services (911 in the [place] Do not drive yourself to the hospital. Summary A ventral hernia is a bulge of tissue from inside the abdomen that pushes through a weak area of the muscles that form the front wall of the abdomen. This condition is treated with surgery, which may be urgent depending on your hernia. Do not lift anything that is heavier than 10 lb (4.5 kg), and follow activity instructions from your health care provider. This information is not intended to replace advice given to you by your health care provider. Make sure you discuss any questions you have with your health care provider. Document Revised: 02/11/2020 Document Reviewed: 02/11/2020 Elsevier Patient Education  2023 [employer]  

## 2021-11-26 NOTE — Telephone Encounter (Signed)
Patient has been advised of Pre-Admission date/time, COVID Testing date and Surgery date.  Surgery Date: 12/06/21 Preadmission Testing Date: 11/30/21 (phone 1p-5p) Covid Testing Date: Not needed.    Patient has been made aware to call [phone], between 1-3:00pm the day before surgery, to find out what time to arrive for surgery.

## 2021-11-27 ENCOUNTER — Other Ambulatory Visit: Payer: Self-pay

## 2021-11-27 MED ORDER — ALBUTEROL SULFATE HFA 108 (90 BASE) MCG/ACT IN AERS
2.0000 | INHALATION_SPRAY | Freq: Four times a day (QID) | RESPIRATORY_TRACT | 1 refills | Status: AC | PRN
Start: 2021-11-27 — End: ?

## 2021-11-27 MED ORDER — AMLODIPINE BESYLATE 5 MG PO TABS: 5.0000 mg | ORAL_TABLET | Freq: Every day | ORAL | 1 refills | Status: DC

## 2021-11-30 ENCOUNTER — Encounter
Admission: RE | Admit: 2021-11-30 | Discharge: 2021-11-30 | Disposition: A | Discharge status: Home / Self Care | Payer: PRIVATE HEALTH INSURANCE | Source: Ambulatory Visit | Attending: Surgery | Admitting: Surgery

## 2021-11-30 ENCOUNTER — Other Ambulatory Visit: Payer: Self-pay

## 2021-11-30 DIAGNOSIS — Z01812 Encounter for preprocedural laboratory examination: Secondary | ICD-10-CM

## 2021-11-30 HISTORY — DX: Cardiac murmur, unspecified: R01.1

## 2021-11-30 HISTORY — DX: Cerebral infarction, unspecified: I63.9

## 2021-11-30 NOTE — Patient Instructions (Addendum)
Your procedure is scheduled on: 12/06/21 - Thursday Report to the Registration Desk on the 1st floor of the [facility]. To find out your arrival time, please call [phone] between 1PM - 3PM on:12/05/21 - Wednesday If your arrival time is 6:00 am, do not arrive prior to that time as the [facility] entrance doors do not open until 6:00 am.  REMEMBER: Instructions that are not followed completely may result in serious medical risk, up to and including death; or upon the discretion of your surgeon and anesthesiologist your surgery may need to be rescheduled.  Do not eat food after midnight the night before surgery.  No gum chewing, lozengers or hard candies.  You may however, drink CLEAR liquids up to 2 hours before you are scheduled to arrive for your surgery. Do not drink anything within 2 hours of your scheduled arrival time.  Clear liquids include: - water  - apple juice without pulp - gatorade (not RED colors) - black coffee or tea (Do NOT add milk or creamers to the coffee or tea) Do NOT drink anything that is not on this list.  TAKE ONLY THESE MEDICATIONS THE MORNING OF SURGERY WITH A SIP OF WATER:  - amLODipine (NORVASC) 5 MG tablet - omeprazole (PRILOSEC) 20 MG capsule, (take one the night before and one on the morning of surgery - helps to prevent nausea after surgery.)  Use albuterol (VENTOLIN HFA) 108 (90 Base) MCG/ACT inhaler on the day of surgery and bring to the hospital.  One week prior to surgery: Stop taking beginning 11/30/21. Stop Anti-inflammatories (NSAIDS) such as Advil, Aleve, Ibuprofen, Motrin, Naproxen, Naprosyn and Aspirin based products such as Excedrin, Goodys Powder, BC Powder.  Stop ANY OVER THE COUNTER supplements until after surgery.  You may take Tylenol if needed for pain up until the day of surgery.  No Alcohol for 24 hours before or after surgery.  No Smoking including e-cigarettes for 24 hours prior to surgery.  No chewable tobacco  products for at least 6 hours prior to surgery.  No nicotine patches on the day of surgery.  Do not use any "recreational" drugs for at least a week prior to your surgery.  Please be advised that the combination of cocaine and anesthesia may have negative outcomes, up to and including death. If you test positive for cocaine, your surgery will be cancelled.  On the morning of surgery brush your teeth with toothpaste and water, you may rinse your mouth with mouthwash if you wish. Do not swallow any toothpaste or mouthwash.  Use CHG Soap or wipes as directed on instruction sheet.  Do not wear jewelry, make-up, hairpins, clips or nail polish.  Do not wear lotions, powders, or perfumes.   Do not shave body from the neck down 48 hours prior to surgery just in case you cut yourself which could leave a site for infection.  Also, freshly shaved skin may become irritated if using the CHG soap.  Contact lenses, hearing aids and dentures may not be worn into surgery.  Do not bring valuables to the hospital. [facility] is not responsible for any missing/lost belongings or valuables.   Notify your doctor if there is any change in your medical condition (cold, fever, infection).  Wear comfortable clothing (specific to your surgery type) to the hospital.  After surgery, you can help prevent lung complications by doing breathing exercises.  Take deep breaths and cough every 1-2 hours. Your doctor may order a device called an Incentive  Spirometer to help you take deep breaths. When coughing or sneezing, hold a pillow firmly against your incision with both hands. This is called "splinting." Doing this helps protect your incision. It also decreases belly discomfort.  If you are being admitted to the hospital overnight, leave your suitcase in the car. After surgery it may be brought to your room.  If you are being discharged the day of surgery, you will not be allowed to drive home. You will need a  responsible adult (18 years or older) to drive you home and stay with you that night.   If you are taking public transportation, you will need to have a responsible adult (18 years or older) with you. Please confirm with your physician that it is acceptable to use public transportation.   Please call the [facility] Dept. at [phone] if you have any questions about these instructions.  Surgery Visitation Policy:  Patients undergoing a surgery or procedure may have two family members or support persons with them as long as the person is not COVID-19 positive or experiencing its symptoms.   Inpatient Visitation:    Visiting hours are 7 a.m. to 8 p.m. Up to four visitors are allowed at one time in a patient room, including children. The visitors may rotate out with other people during the day. One designated support person (adult) may remain overnight.

## 2021-12-04 ENCOUNTER — Encounter: Payer: Self-pay | Admitting: Urgent Care

## 2021-12-04 ENCOUNTER — Encounter
Admission: RE | Admit: 2021-12-04 | Discharge: 2021-12-04 | Disposition: A | Discharge status: Home / Self Care | Payer: Medicare Other | Source: Ambulatory Visit | Attending: Surgery | Admitting: Surgery

## 2021-12-04 ENCOUNTER — Telehealth: Payer: Self-pay

## 2021-12-04 DIAGNOSIS — K4090 Unilateral inguinal hernia, without obstruction or gangrene, not specified as recurrent: Secondary | ICD-10-CM | POA: Diagnosis not present

## 2021-12-04 DIAGNOSIS — Z01818 Encounter for other preprocedural examination: Secondary | ICD-10-CM | POA: Insufficient documentation

## 2021-12-04 DIAGNOSIS — E876 Hypokalemia: Secondary | ICD-10-CM | POA: Insufficient documentation

## 2021-12-04 DIAGNOSIS — Z01812 Encounter for preprocedural laboratory examination: Secondary | ICD-10-CM

## 2021-12-04 LAB — BASIC METABOLIC PANEL
Anion gap: 12 (ref 5–15)
BUN: 8 mg/dL (ref 8–23)
CO2: 25 mmol/L (ref 22–32)
Calcium: 8.4 mg/dL — ABNORMAL LOW (ref 8.9–10.3)
Chloride: 104 mmol/L (ref 98–111)
Creatinine, Ser: 0.78 mg/dL (ref 0.61–1.24)
GFR, Estimated: >60 mL/min (ref >60)
Glucose, Bld: 124 mg/dL — ABNORMAL HIGH (ref 70–99)
Potassium: 2.9 mmol/L — ABNORMAL LOW (ref 3.5–5.1)
Sodium: 141 mmol/L (ref 135–145)

## 2021-12-04 LAB — CBC
HCT: 35.7 % — ABNORMAL LOW (ref 39.0–52.0)
Hemoglobin: 11.7 g/dL — ABNORMAL LOW (ref 13.0–17.0)
MCH: 31 pg (ref 26.0–34.0)
MCHC: 32.8 g/dL (ref 30.0–36.0)
MCV: 94.4 fL (ref 80.0–100.0)
Platelets: 241 10*3/uL (ref 150–400)
RBC: 3.78 MIL/uL — ABNORMAL LOW (ref 4.22–5.81)
RDW: 17.3 % — ABNORMAL HIGH (ref 11.5–15.5)
WBC: 5.7 10*3/uL (ref 4.0–10.5)
nRBC: 0 % (ref 0.0–0.2)

## 2021-12-04 MED ORDER — POTASSIUM CHLORIDE CRYS ER 20 MEQ PO TBCR: 40.0000 meq | EXTENDED_RELEASE_TABLET | Freq: Two times a day (BID) | ORAL | 0 refills | Status: AC

## 2021-12-04 NOTE — Progress Notes (Addendum)
  [facility] Perioperative Services: Pre-Admission/Anesthesia Testing  Abnormal Lab Notification   Date: 12/04/21  Name: [name] MRN:   [id number]  Re: Abnormal labs noted during PAT appointment   Notified:  Provider Name Provider Role Notification Mode  Logan, Bea Graff, MD  General Surgery (Surgeon) Routed and/or faxed via [place] and Notes:  ABNORMAL LAB VALUE(S): Lab Results  Component Value Date   K 2.9 (L) 12/04/2021   [name] is scheduled for an elective [place] on 12/06/2021. In review of his medication reconciliation, it is noted that the patient is NOT taking any type of prescribed diuretic medications. Please note, in efforts to promote a safe and effective anesthetic course, per current guidelines/standards set by the [facility] anesthesia team, the minimal acceptable K+ level for the patient to proceed with general anesthesia is 3.0 mmol/L. With that being said, at his current level, his elective procedure would need to be postponed until K+ is better optimized. In efforts to prevent case cancellation, abnormal result is being forwarded to primary attending surgeon for review and optimization. Order placed to have K+ rechecked on the day of his procedure to ensure correction of the noted derangement.    Honor Loh, MSN, APRN, FNP-C, CEN [facility]  Peri-operative Services Nurse Practitioner Phone: [phone] Fax: [phone] 12/04/21 12:39 PM

## 2021-12-04 NOTE — Telephone Encounter (Signed)
Left detailed message letting patient know low potassium and that he can pick up his Potassium at [facility] and begin taking it today-also eat potassium rich foods. I also asked patient to call and let me know he did receive the message.   Potassium 40 Meq BID # 10 no refill

## 2021-12-06 ENCOUNTER — Other Ambulatory Visit: Payer: Self-pay

## 2021-12-06 ENCOUNTER — Ambulatory Visit: Payer: Medicare Other | Admitting: Anesthesiology

## 2021-12-06 ENCOUNTER — Ambulatory Visit
Admission: RE | Admit: 2021-12-06 | Discharge: 2021-12-06 | Disposition: A | Discharge status: Home / Self Care | Payer: Medicare Other | Attending: Surgery | Admitting: Surgery

## 2021-12-06 ENCOUNTER — Encounter: Payer: Self-pay | Admitting: Surgery

## 2021-12-06 ENCOUNTER — Encounter
Admission: RE | Disposition: A | Discharge status: Home / Self Care | Payer: Self-pay | Source: Home / Self Care | Attending: Surgery

## 2021-12-06 DIAGNOSIS — E876 Hypokalemia: Secondary | ICD-10-CM

## 2021-12-06 DIAGNOSIS — I4510 Unspecified right bundle-branch block: Secondary | ICD-10-CM | POA: Diagnosis not present

## 2021-12-06 DIAGNOSIS — J449 Chronic obstructive pulmonary disease, unspecified: Secondary | ICD-10-CM | POA: Insufficient documentation

## 2021-12-06 DIAGNOSIS — K4091 Unilateral inguinal hernia, without obstruction or gangrene, recurrent: Secondary | ICD-10-CM | POA: Diagnosis not present

## 2021-12-06 DIAGNOSIS — Z8673 Personal history of transient ischemic attack (TIA), and cerebral infarction without residual deficits: Secondary | ICD-10-CM | POA: Insufficient documentation

## 2021-12-06 DIAGNOSIS — K219 Gastro-esophageal reflux disease without esophagitis: Secondary | ICD-10-CM | POA: Diagnosis not present

## 2021-12-06 DIAGNOSIS — I10 Essential (primary) hypertension: Secondary | ICD-10-CM | POA: Diagnosis not present

## 2021-12-06 DIAGNOSIS — F17210 Nicotine dependence, cigarettes, uncomplicated: Secondary | ICD-10-CM | POA: Insufficient documentation

## 2021-12-06 DIAGNOSIS — Z01812 Encounter for preprocedural laboratory examination: Secondary | ICD-10-CM

## 2021-12-06 HISTORY — PX: INGUINAL HERNIA REPAIR: SHX194

## 2021-12-06 HISTORY — PX: INSERTION OF MESH: SHX5868

## 2021-12-06 LAB — POCT I-STAT, CHEM 8
BUN: 7 mg/dL — ABNORMAL LOW (ref 8–23)
Calcium, Ion: 1.12 mmol/L — ABNORMAL LOW (ref 1.15–1.40)
Chloride: 104 mmol/L (ref 98–111)
Creatinine, Ser: 0.7 mg/dL (ref 0.61–1.24)
Glucose, Bld: 108 mg/dL — ABNORMAL HIGH (ref 70–99)
HCT: 40 % (ref 39.0–52.0)
Hemoglobin: 13.6 g/dL (ref 13.0–17.0)
Potassium: 4.1 mmol/L (ref 3.5–5.1)
Sodium: 139 mmol/L (ref 135–145)
TCO2: 23 mmol/L (ref 22–32)

## 2021-12-06 SURGERY — REPAIR, HERNIA, INGUINAL, ADULT
Anesthesia: General | Laterality: Left

## 2021-12-06 MED ORDER — LACTATED RINGERS IV SOLN: INTRAVENOUS | Status: DC | PRN

## 2021-12-06 MED ORDER — ONDANSETRON HCL 4 MG/2ML IJ SOLN
INTRAMUSCULAR | Status: AC
  Filled 2021-12-06: qty 2

## 2021-12-06 MED ORDER — MIDAZOLAM HCL 2 MG/2ML IJ SOLN
INTRAMUSCULAR | Status: DC | PRN
  Administered 2021-12-06 (×2): 1 mg via INTRAVENOUS

## 2021-12-06 MED ORDER — CELECOXIB 200 MG PO CAPS
ORAL_CAPSULE | ORAL | Status: AC
  Administered 2021-12-06: 200 mg via ORAL
  Filled 2021-12-06: qty 1

## 2021-12-06 MED ORDER — DEXAMETHASONE SODIUM PHOSPHATE 10 MG/ML IJ SOLN
INTRAMUSCULAR | Status: DC | PRN
  Administered 2021-12-06: 10 mg via INTRAVENOUS

## 2021-12-06 MED ORDER — CHLORHEXIDINE GLUCONATE 0.12 % MT SOLN
OROMUCOSAL | Status: AC
  Administered 2021-12-06: 15 mL via OROMUCOSAL
  Filled 2021-12-06: qty 15

## 2021-12-06 MED ORDER — ESMOLOL HCL 100 MG/10ML IV SOLN
INTRAVENOUS | Status: DC | PRN
  Administered 2021-12-06: 20 mg via INTRAVENOUS

## 2021-12-06 MED ORDER — BUPIVACAINE-EPINEPHRINE (PF) 0.25% -1:200000 IJ SOLN
INTRAMUSCULAR | Status: AC
  Filled 2021-12-06: qty 30

## 2021-12-06 MED ORDER — VANCOMYCIN HCL 1000 MG IV SOLR
INTRAVENOUS | Status: DC | PRN
  Administered 2021-12-06: 1000 mg via INTRAVENOUS

## 2021-12-06 MED ORDER — VANCOMYCIN HCL IN DEXTROSE 1-5 GM/200ML-% IV SOLN
INTRAVENOUS | Status: AC
  Filled 2021-12-06: qty 200

## 2021-12-06 MED ORDER — ONDANSETRON HCL 4 MG/2ML IJ SOLN
INTRAMUSCULAR | Status: DC | PRN
  Administered 2021-12-06: 4 mg via INTRAVENOUS

## 2021-12-06 MED ORDER — CHLORHEXIDINE GLUCONATE CLOTH 2 % EX PADS: 6.0000 | MEDICATED_PAD | Freq: Once | CUTANEOUS | Status: DC

## 2021-12-06 MED ORDER — ACETAMINOPHEN 500 MG PO TABS: 1000.0000 mg | ORAL_TABLET | ORAL | Status: AC

## 2021-12-06 MED ORDER — LIDOCAINE HCL (PF) 2 % IJ SOLN
INTRAMUSCULAR | Status: AC
  Filled 2021-12-06: qty 5

## 2021-12-06 MED ORDER — LIDOCAINE HCL (CARDIAC) PF 100 MG/5ML IV SOSY
PREFILLED_SYRINGE | INTRAVENOUS | Status: DC | PRN
  Administered 2021-12-06: 100 mg via INTRAVENOUS

## 2021-12-06 MED ORDER — GABAPENTIN 300 MG PO CAPS
ORAL_CAPSULE | ORAL | Status: AC
  Administered 2021-12-06: 300 mg via ORAL
  Filled 2021-12-06: qty 1

## 2021-12-06 MED ORDER — HYDROCODONE-ACETAMINOPHEN 5-325 MG PO TABS: 1.0000 | ORAL_TABLET | ORAL | 0 refills | Status: DC | PRN

## 2021-12-06 MED ORDER — PHENYLEPHRINE 80 MCG/ML (10ML) SYRINGE FOR IV PUSH (FOR BLOOD PRESSURE SUPPORT)
PREFILLED_SYRINGE | INTRAVENOUS | Status: DC | PRN
  Administered 2021-12-06: 80 ug via INTRAVENOUS
  Administered 2021-12-06: 160 ug via INTRAVENOUS

## 2021-12-06 MED ORDER — DEXMEDETOMIDINE (PRECEDEX) IN NS 20 MCG/5ML (4 MCG/ML) IV SYRINGE
PREFILLED_SYRINGE | INTRAVENOUS | Status: DC | PRN
  Administered 2021-12-06: 12 ug via INTRAVENOUS
  Administered 2021-12-06: 10 ug via INTRAVENOUS
  Administered 2021-12-06: 8 ug via INTRAVENOUS
  Administered 2021-12-06: 10 ug via INTRAVENOUS

## 2021-12-06 MED ORDER — PHENYLEPHRINE HCL-NACL 20-0.9 MG/250ML-% IV SOLN
INTRAVENOUS | Status: AC
  Filled 2021-12-06: qty 250

## 2021-12-06 MED ORDER — CEFAZOLIN SODIUM-DEXTROSE 2-4 GM/100ML-% IV SOLN
INTRAVENOUS | Status: AC
  Filled 2021-12-06: qty 100

## 2021-12-06 MED ORDER — OXYCODONE HCL 5 MG PO TABS: 5.0000 mg | ORAL_TABLET | Freq: Once | ORAL | Status: DC | PRN

## 2021-12-06 MED ORDER — CELECOXIB 200 MG PO CAPS: 200.0000 mg | ORAL_CAPSULE | ORAL | Status: AC

## 2021-12-06 MED ORDER — CEFAZOLIN SODIUM-DEXTROSE 2-4 GM/100ML-% IV SOLN: 2.0000 g | INTRAVENOUS | Status: DC

## 2021-12-06 MED ORDER — DROPERIDOL 2.5 MG/ML IJ SOLN: 0.6250 mg | Freq: Once | INTRAMUSCULAR | Status: DC | PRN

## 2021-12-06 MED ORDER — BUPIVACAINE LIPOSOME 1.3 % IJ SUSP
INTRAMUSCULAR | Status: AC
  Filled 2021-12-06: qty 20

## 2021-12-06 MED ORDER — ROCURONIUM BROMIDE 10 MG/ML (PF) SYRINGE
PREFILLED_SYRINGE | INTRAVENOUS | Status: AC
  Filled 2021-12-06: qty 10

## 2021-12-06 MED ORDER — MIDAZOLAM HCL 2 MG/2ML IJ SOLN
INTRAMUSCULAR | Status: AC
  Filled 2021-12-06: qty 2

## 2021-12-06 MED ORDER — DEXAMETHASONE SODIUM PHOSPHATE 10 MG/ML IJ SOLN
INTRAMUSCULAR | Status: AC
  Filled 2021-12-06: qty 1

## 2021-12-06 MED ORDER — PROPOFOL 10 MG/ML IV BOLUS
INTRAVENOUS | Status: AC
  Filled 2021-12-06: qty 40

## 2021-12-06 MED ORDER — GABAPENTIN 300 MG PO CAPS: 300.0000 mg | ORAL_CAPSULE | ORAL | Status: AC

## 2021-12-06 MED ORDER — FENTANYL CITRATE (PF) 100 MCG/2ML IJ SOLN
INTRAMUSCULAR | Status: AC
  Filled 2021-12-06: qty 2

## 2021-12-06 MED ORDER — ACETAMINOPHEN 500 MG PO TABS
ORAL_TABLET | ORAL | Status: AC
  Administered 2021-12-06: 1000 mg via ORAL
  Filled 2021-12-06: qty 2

## 2021-12-06 MED ORDER — CHLORHEXIDINE GLUCONATE 0.12 % MT SOLN: 15.0000 mL | Freq: Once | OROMUCOSAL | Status: AC

## 2021-12-06 MED ORDER — BUPIVACAINE-EPINEPHRINE (PF) 0.25% -1:200000 IJ SOLN
INTRAMUSCULAR | Status: DC | PRN
  Administered 2021-12-06: 50 mL

## 2021-12-06 MED ORDER — OXYCODONE HCL 5 MG/5ML PO SOLN: 5.0000 mg | Freq: Once | ORAL | Status: DC | PRN

## 2021-12-06 MED ORDER — ORAL CARE MOUTH RINSE: 15.0000 mL | Freq: Once | OROMUCOSAL | Status: AC

## 2021-12-06 MED ORDER — FENTANYL CITRATE (PF) 100 MCG/2ML IJ SOLN: 25.0000 ug | INTRAMUSCULAR | Status: DC | PRN

## 2021-12-06 MED ORDER — ACETAMINOPHEN 10 MG/ML IV SOLN: 1000.0000 mg | Freq: Once | INTRAVENOUS | Status: DC | PRN

## 2021-12-06 MED ORDER — LACTATED RINGERS IV SOLN: INTRAVENOUS | Status: DC

## 2021-12-06 MED ORDER — PROMETHAZINE HCL 25 MG/ML IJ SOLN: 6.2500 mg | INTRAMUSCULAR | Status: DC | PRN

## 2021-12-06 MED ORDER — VANCOMYCIN HCL IN DEXTROSE 1-5 GM/200ML-% IV SOLN: 1000.0000 mg | Freq: Once | INTRAVENOUS | Status: DC

## 2021-12-06 MED ORDER — PROPOFOL 10 MG/ML IV BOLUS
INTRAVENOUS | Status: DC | PRN
  Administered 2021-12-06: 200 mg via INTRAVENOUS

## 2021-12-06 MED ORDER — ROCURONIUM BROMIDE 100 MG/10ML IV SOLN
INTRAVENOUS | Status: DC | PRN
  Administered 2021-12-06: 60 mg via INTRAVENOUS
  Administered 2021-12-06: 10 mg via INTRAVENOUS
  Administered 2021-12-06: 5 mg via INTRAVENOUS

## 2021-12-06 MED ORDER — FENTANYL CITRATE (PF) 100 MCG/2ML IJ SOLN
INTRAMUSCULAR | Status: DC | PRN
  Administered 2021-12-06 (×2): 50 ug via INTRAVENOUS

## 2021-12-06 MED ORDER — SUGAMMADEX SODIUM 200 MG/2ML IV SOLN
INTRAVENOUS | Status: DC | PRN
  Administered 2021-12-06: 200 mg via INTRAVENOUS

## 2021-12-06 SURGICAL SUPPLY — 36 items
ADH SKN CLS APL DERMABOND .7 (GAUZE/BANDAGES/DRESSINGS) ×2
APL PRP STRL LF DISP 70% ISPRP (MISCELLANEOUS) ×2
BLADE CLIPPER SURG (BLADE) ×3 IMPLANT
CHLORAPREP W/TINT 26 (MISCELLANEOUS) ×3 IMPLANT
DERMABOND ADVANCED (GAUZE/BANDAGES/DRESSINGS) ×1
DERMABOND ADVANCED .7 DNX12 (GAUZE/BANDAGES/DRESSINGS) ×2 IMPLANT
DRAIN PENROSE 0.625X18 (DRAIN) ×3 IMPLANT
DRAPE INCISE IOBAN 66X45 STRL (DRAPES) ×2 IMPLANT
DRAPE LAPAROTOMY 77X122 PED (DRAPES) ×3 IMPLANT
ELECT CAUTERY BLADE 6.4 (BLADE) ×3 IMPLANT
ELECT REM PT RETURN 9FT ADLT (ELECTROSURGICAL) ×3
ELECTRODE REM PT RTRN 9FT ADLT (ELECTROSURGICAL) ×2 IMPLANT
GAUZE 4X4 16PLY ~~LOC~~+RFID DBL (SPONGE) ×2 IMPLANT
GLOVE BIO SURGEON STRL SZ7 (GLOVE) ×12 IMPLANT
GOWN STRL REUS W/ TWL LRG LVL3 (GOWN DISPOSABLE) ×4 IMPLANT
GOWN STRL REUS W/TWL LRG LVL3 (GOWN DISPOSABLE) ×15
MANIFOLD NEPTUNE II (INSTRUMENTS) ×3 IMPLANT
MESH PARIETEX PROGRIP LEFT (Mesh General) ×1 IMPLANT
NEEDLE HYPO 22GX1.5 SAFETY (NEEDLE) ×3 IMPLANT
NS IRRIG 1000ML POUR BTL (IV SOLUTION) ×3 IMPLANT
PACK BASIN MINOR ARMC (MISCELLANEOUS) ×3 IMPLANT
SPONGE KITTNER 5P (MISCELLANEOUS) ×3 IMPLANT
SPONGE T-LAP 18X18 ~~LOC~~+RFID (SPONGE) ×3 IMPLANT
SUT ETHIBOND NAB MO 7 #0 18IN (SUTURE) ×7 IMPLANT
SUT MNCRL AB 4-0 PS2 18 (SUTURE) ×3 IMPLANT
SUT SILK 2 0 (SUTURE)
SUT SILK 2 0SH CR/8 30 (SUTURE) IMPLANT
SUT SILK 2-0 18XBRD TIE 12 (SUTURE) IMPLANT
SUT VIC AB 2-0 SH 27 (SUTURE) ×9
SUT VIC AB 2-0 SH 27XBRD (SUTURE) IMPLANT
SUT VIC AB 3-0 54X BRD REEL (SUTURE) ×2 IMPLANT
SUT VIC AB 3-0 BRD 54 (SUTURE) ×3
SUT VIC AB 3-0 SH 27 (SUTURE) ×3
SUT VIC AB 3-0 SH 27X BRD (SUTURE) ×2 IMPLANT
SYR 20ML LL LF (SYRINGE) ×3 IMPLANT
WATER STERILE IRR 500ML POUR (IV SOLUTION) ×2 IMPLANT

## 2021-12-06 NOTE — Transfer of Care (Signed)
Immediate Anesthesia Transfer of Care Note  Patient: [name]  Procedure(s) Performed: HERNIA REPAIR INGUINAL ADULT, open, RNFA to assist per doctor (Left) INSERTION OF MESH  Patient Location: PACU  Anesthesia Type:General  Level of Consciousness: awake  Airway & Oxygen Therapy: Patient Spontanous Breathing and Patient connected to face mask oxygen  Post-op Assessment: Report given to RN and Post -op Vital signs reviewed and stable  Post vital signs: Reviewed and stable  Last Vitals:  Vitals Value Taken Time  BP 142/87 12/06/21 1110  Temp    Pulse 83 12/06/21 1110  Resp 20 12/06/21 1110  SpO2 100 % 12/06/21 1110  Vitals shown include unvalidated device data.  Last Pain:  Vitals:   12/06/21 0748  TempSrc: Temporal  PainSc: 0-No pain         Complications: No notable events documented.

## 2021-12-06 NOTE — Anesthesia Procedure Notes (Signed)
Procedure Name: Intubation Date/Time: 12/06/2021 8:59 AM Performed by: Loletha Grayer, CRNA Pre-anesthesia Checklist: Patient identified, Patient being monitored, Timeout performed, Emergency Drugs available and Suction available Patient Re-evaluated:Patient Re-evaluated prior to induction Oxygen Delivery Method: Circle system utilized Preoxygenation: Pre-oxygenation with 100% oxygen Induction Type: IV induction Ventilation: Mask ventilation without difficulty Laryngoscope Size: McGraph and 4 Grade View: Grade I Tube type: Oral Tube size: 7.0 mm Number of attempts: 1 Airway Equipment and Method: Stylet Placement Confirmation: ETT inserted through vocal cords under direct vision, positive ETCO2 and breath sounds checked- equal and bilateral Secured at: 22 cm Tube secured with: Tape Dental Injury: Teeth and Oropharynx as per pre-operative assessment  Comments: DLV x1 with MAC 4. Poor view. McGraph x1 attempt. Successful. Atraumatic.

## 2021-12-06 NOTE — Interval H&P Note (Signed)
History and Physical Interval Note:  12/06/2021 8:33 AM  [name]  has presented today for surgery, with the diagnosis of recurrent left inguinal hernia.  The various methods of treatment have been discussed with the patient and family. After consideration of risks, benefits and other options for treatment, the patient has consented to  Procedure(s): HERNIA REPAIR INGUINAL ADULT, open, RNFA to assist per doctor (Left) as a surgical intervention.  The patient's history has been reviewed, patient examined, no change in status, stable for surgery.  I have reviewed the patient's chart and labs.  Questions were answered to the patient's satisfaction.     [place]

## 2021-12-06 NOTE — Op Note (Signed)
Open Left Inguinal Hernia Repair with 12x 8 cms progrip mesh      Pre-operative Diagnosis:  Recurrent Left Inguinal Hernia   Post-operative Diagnosis: Same   Procedure:   Left Inguinal Hernia Repair with 12x 8 cms progrip mesh  Surgeon: Caroleen Hamman, MD FACS   Anesthesia: Gen. with endotracheal tube   Findings: Recurrent Left inguinal hernia, recurrence inferior to prior place mesh        Procedure Details  The patient was seen again in the Holding Room. The benefits, complications, treatment options, and expected outcomes were discussed with the patient. The risks of bleeding, infection, recurrence of symptoms, failure to resolve symptoms, recurrence of hernia, ischemic orchitis, chronic pain syndrome or neuroma, were discussed again. The likelihood of improving the patient's symptoms with return to their baseline status is good.  The patient and/or family concurred with the proposed plan, giving informed consent.  The patient was taken to Operating Room, identified  and the procedure verified as  Inguinal Hernia Repair. Laterality confirmed.  A Time Out was held and the above information confirmed.   Prior to the induction of general anesthesia, antibiotic prophylaxis was administered. VTE prophylaxis was in place. General endotracheal anesthesia was then administered and tolerated well. After the induction, the abdomen was prepped with Chloraprep and draped in the sterile fashion. The patient was positioned in the supine position.   Left inguinal incision was created with a 15 blade knife.  Electrocautery was used to dissect through subcutaneous tissue and the external bleak fascia was identified and incised.  We identified the ilioinguinal nerve and there was some significant attachment.  At this time I decided to dissected with electrocautery.  This will avoid future chronic pain issues. The internal inguinal ring was open.  Were able to identify of an indirect defect with a recurrence  inferior to the mesh.  I was able to excise the sac and close the hernia sac.  Specimen was sent.  I was able to reinforce the astrocytes fascia to the shelving edge of the inguinal ligament to further obliterate the defect.  Mesh was placed in the standard fashion and I attached it with interrupted sutures Glytrin due to the shelving edge of the inguinal ligament and medially to the conjoined tendon.  We were able to make sure that the cord structures were intact and the a slit of the mesh move freely around the cord. Ilioinguinal nerve block was done using Exparel as well as infiltration of Exparel around the incision site. The external oblique muscle fascia was closed with interrupted 2-0 Vicryl sutures.  Scarpa's fascia was closed with close with a 3-0 running Vicryl and the skin was closed with a 4-0 Monocryl in a subcuticular fashion Dermabond was placed.  Patient tolerated the procedure well. There were no complications. He was taken to the recovery room in stable condition.                 Caroleen Hamman, MD, FACS

## 2021-12-06 NOTE — Anesthesia Preprocedure Evaluation (Addendum)
Anesthesia Evaluation  Patient identified by MRN, date of birth, ID band Patient awake    Reviewed: Allergy & Precautions, NPO status , Patient's Chart, lab work & pertinent test results  Airway Mallampati: II  TM Distance: >3 FB Neck ROM: Full    Dental  (+) Poor Dentition, Missing, Loose,    Pulmonary COPD,  COPD inhaler, Current Smoker and Patient abstained from smoking.,    Pulmonary exam normal        Cardiovascular Exercise Tolerance: Good hypertension, Pt. on medications + dysrhythmias  Rhythm:Regular Rate:Normal  Right bundle branch block   Neuro/Psych TIA   GI/Hepatic GERD  Medicated and Controlled,  Endo/Other    Renal/GU      Musculoskeletal  (+) Arthritis ,   Abdominal Normal abdominal exam  (+)   Peds  Hematology   Anesthesia Other Findings Chronic hypokalemia on supplements  Acid reflux    Alcohol abuse    Arthritis  "left pinky" (10/17/2014)  COPD (chronic obstructive pulmonary disease) (HCC) DDD (degenerative disc disease), lumbar   Gastroenteritis    History of stress test 2001 no ischemia  Hypertension    Inguinal hernia    Ribs, multiple fractures 12/2018   TIA (transient ischemic attack) 08/22/2017 (also 04/08/18) No deficits  Tobacco abuse    Vertebral compression fracture (HCC)       Reproductive/Obstetrics                           Anesthesia Physical  Anesthesia Plan  ASA: 3  Anesthesia Plan: General   Post-op Pain Management: Gabapentin PO (pre-op)*, Celebrex PO (pre-op)* and Tylenol PO (pre-op)*   Induction: Intravenous  PONV Risk Score and Plan: 2 and Propofol infusion, Treatment may vary due to age or medical condition, Ondansetron and Midazolam  Airway Management Planned: Oral ETT  Additional Equipment:   Intra-op Plan:   Post-operative Plan: Extubation in OR  Informed Consent: I have reviewed the patients History and Physical, chart,  labs and discussed the procedure including the risks, benefits and alternatives for the proposed anesthesia with the patient or authorized representative who has indicated his/her understanding and acceptance.     Dental advisory given  Plan Discussed with: CRNA, Anesthesiologist and Surgeon  Anesthesia Plan Comments:        Anesthesia Quick Evaluation

## 2021-12-06 NOTE — Discharge Instructions (Addendum)
Open Hernia Repair, Adult, Care After The following information offers guidance on how to care for yourself after your procedure. Your health care provider may also give you more specific instructions. If you have problems or questions, contact your health care provider. What can I expect after the procedure? After the procedure, it is common to have: Mild discomfort. Slight bruising. Minor swelling. Pain in the abdomen. A small amount of blood from the incision. Follow these instructions at home: Medicines Take over-the-counter and prescription medicines only as told by your health care provider. Ask your hea     AMBULATORY SURGERY  DISCHARGE INSTRUCTIONS   The drugs that you were given will stay in your system until tomorrow so for the next 24 hours you should not:  Drive an automobile Make any legal decisions Drink any alcoholic beverage   You may resume regular meals tomorrow.  Today it is better to start with liquids and gradually work up to solid foods.  You may eat anything you prefer, but it is better to start with liquids, then soup and crackers, and gradually work up to solid foods.   Please notify your doctor immediately if you have any unusual bleeding, trouble breathing, redness and pain at the surgery site, drainage, fever, or pain not relieved by medication.     Your post-operative visit with Dr.                                       is: Date:                        Time:    Please call to schedule your post-operative visit.  Additional Instructions:         lth care provider if the medicine prescribed to you: Requires you to avoid driving or using machinery. Can cause constipation. You may need to take these actions to prevent or treat constipation: Drink enough fluid to keep your urine pale yellow. Take over-the-counter or prescription medicines. Eat foods that are high in fiber, such as beans, whole grains, and fresh fruits and  vegetables. Limit foods that are high in fat and processed sugars, such as fried or sweet foods. If you were prescribed an antibiotic medicine, take it as told by your health care provider. Do not stop using the antibiotic even if you start to feel better. Incision care Follow instructions from your health care provider about how to take care of your incision. Make sure you: Wash your hands with soap and water for at least 20 seconds before and after you change your bandage (dressing). If soap and water are not available, use hand sanitizer. Change your dressing as told by your health care provider. Leave stitches (sutures), skin glue, or adhesive strips in place. These skin closures may need to stay in place for 2 weeks or longer. If adhesive strip edges start to loosen and curl up, you may trim the loose edges. Do not remove adhesive strips completely unless your health care provider tells you to do that. Check your incision area every day for signs of infection. Check for: More redness, swelling, or pain. More fluid or blood. Warmth. Pus or a bad smell. Wear loose, soft clothing while your incision heals. Activity Rest as told by your health care provider. Do not lift anything that is heavier than 10 lb (4.5 kg), or the limit  that you are told, until your health care provider says that it is safe. Do not play contact sports until your health care provider says that this is safe. If you were given a sedative during the procedure, it can affect you for several hours. Do not drive or operate machinery until your health care provider says that it is safe. Return to your normal activities as told by your health care provider. Ask your health care provider what activities are safe for you. General instructions Do not take baths, swim, or use a hot tub until your health care provider approves. Ask your health care provider if you may take showers. You may only be allowed to take sponge baths. Hold  a pillow over your abdomen when you cough or sneeze. This helps with pain. Do not use any products that contain nicotine or tobacco. These products include cigarettes, chewing tobacco, and vaping devices, such as e-cigarettes. If you need help quitting, ask your health care provider. Keep all follow-up visits. This is important. Contact a health care provider if: You have any of these signs of infection: More redness, swelling, or pain around your incision. More fluid or blood coming from your incision. Warmth coming from your incision. Pus or a bad smell coming from your incision. A fever or chills. You have blood in your stool (feces). You have not had a bowel movement in 2-3 days. Your pain is not controlled with medicine. Get help right away if: You have chest pain or shortness of breath. You feel faint or light-headed. You have severe pain. You vomit and your pain is worse. You have pain, swelling, or redness in a leg. These symptoms may represent a serious problem that is an emergency. Do not wait to see if the symptoms will go away. Get medical help right away. Call your local emergency services (911 in the [place] Do not drive yourself to the hospital. Summary After an open hernia repair, it is common to have mild discomfort, slight bruising, and minor swelling. Follow instructions from your health care provider about how to take care of your incision. Check every day for signs of infection. Do not lift heavy objects or play contact sports until your health care provider says it is safe. Return to your normal activities as told by your health care provider. Ask your health care provider what activities are safe for you. This information is not intended to replace advice given to you by your health care provider. Make sure you discuss any questions you have with your health care provider. Document Revised: 02/07/2020 Document Reviewed: 02/07/2020 Elsevier Patient Education  [place]   The drugs that you were given will stay in your system until tomorrow so for the next 24 hours you should not:  Drive an automobile Make any legal decisions Drink any alcoholic beverage   You may resume regular meals tomorrow.  Today it is better to start with liquids and gradually work up to solid foods.  You may eat anything you prefer, but it is better to start with liquids, then soup and crackers, and gradually work up to solid foods.   Please notify your doctor immediately if you have any unusual bleeding, trouble breathing, redness and pain at the surgery site, drainage, fever, or pain not relieved by medication.    Additional Instructions:        Please contact your physician with any problems or Same Day Surgery  through Friday 6 am to 4 pm, or  at [facility] Main number at [phone].  

## 2021-12-07 ENCOUNTER — Encounter: Payer: Self-pay | Admitting: Surgery

## 2021-12-07 LAB — SURGICAL PATHOLOGY

## 2021-12-07 NOTE — Anesthesia Postprocedure Evaluation (Signed)
Anesthesia Post Note  Patient: [name]  Procedure(s) Performed: HERNIA REPAIR INGUINAL ADULT, open, RNFA to assist per doctor (Left) INSERTION OF MESH  Patient location during evaluation: PACU Anesthesia Type: General Level of consciousness: awake and alert Pain management: pain level controlled Vital Signs Assessment: post-procedure vital signs reviewed and stable Respiratory status: spontaneous breathing, nonlabored ventilation and respiratory function stable Cardiovascular status: blood pressure returned to baseline and stable Postop Assessment: no apparent nausea or vomiting Anesthetic complications: no   No notable events documented.   Last Vitals:  Vitals:   12/06/21 1145 12/06/21 1155  BP: 118/77 130/82  Pulse: 83 83  Resp: (!) 22   Temp: (!) 36.1 C (!) 36.1 C  SpO2: 93% 96%    Last Pain:  Vitals:   12/06/21 1155  TempSrc: Temporal  PainSc: 0-No pain                 [place] Ouch

## 2021-12-24 ENCOUNTER — Encounter: Payer: Self-pay | Admitting: Surgery

## 2021-12-24 ENCOUNTER — Ambulatory Visit (INDEPENDENT_AMBULATORY_CARE_PROVIDER_SITE_OTHER): Payer: Medicare Other | Admitting: Surgery

## 2021-12-24 VITALS — BP 149/92 | HR 114 | Temp 98.3°F | Wt 219.6 lb

## 2021-12-24 DIAGNOSIS — K4091 Unilateral inguinal hernia, without obstruction or gangrene, recurrent: Secondary | ICD-10-CM

## 2021-12-24 DIAGNOSIS — Z09 Encounter for follow-up examination after completed treatment for conditions other than malignant neoplasm: Secondary | ICD-10-CM

## 2021-12-24 MED ORDER — POTASSIUM CHLORIDE CRYS ER 20 MEQ PO TBCR: 20.0000 meq | EXTENDED_RELEASE_TABLET | Freq: Two times a day (BID) | ORAL | 0 refills | Status: AC

## 2021-12-24 NOTE — Patient Instructions (Addendum)
If you have any concerns or questions, please feel free to call our office. Follow up as needed.    GENERAL POST-OPERATIVE PATIENT INSTRUCTIONS   WOUND CARE INSTRUCTIONS:  Keep a dry clean dressing on the wound if there is drainage. The initial bandage may be removed after 24 hours.  Once the wound has quit draining you may leave it open to air.  If clothing rubs against the wound or causes irritation and the wound is not draining you may cover it with a dry dressing during the daytime.  Try to keep the wound dry and avoid ointments on the wound unless directed to do so.  If the wound becomes bright red and painful or starts to drain infected material that is not clear, please contact your physician immediately.  If the wound is mildly pink and has a thick firm ridge underneath it, this is normal, and is referred to as a healing ridge.  This will resolve over the next 4-6 weeks.  BATHING: You may shower if you have been informed of this by your surgeon. However, Please do not submerge in a tub, hot tub, or pool until incisions are completely sealed or have been told by your surgeon that you may do so.  DIET:  You may eat any foods that you can tolerate.  It is a good idea to eat a high fiber diet and take in plenty of fluids to prevent constipation.  If you do become constipated you may want to take a mild laxative or take ducolax tablets on a daily basis until your bowel habits are regular.  Constipation can be very uncomfortable, along with straining, after recent surgery.  ACTIVITY:  You are encouraged to cough and deep breath or use your incentive spirometer if you were given one, every 15-30 minutes when awake.  This will help prevent respiratory complications and low grade fevers post-operatively if you had a general anesthetic.  You may want to hug a pillow when coughing and sneezing to add additional support to the surgical area, if you had abdominal or chest surgery, which will decrease pain  during these times.  You are encouraged to walk and engage in light activity for the next two weeks.  You should not lift more than 20 pounds for 6 weeks total after surgery as it could put you at increased risk for complications.  Twenty pounds is roughly equivalent to a plastic bag of groceries. At that time- Listen to your body when lifting, if you have pain when lifting, stop and then try again in a few days. Soreness after doing exercises or activities of daily living is normal as you get back in to your normal routine.  MEDICATIONS:  Try to take narcotic medications and anti-inflammatory medications, such as tylenol, ibuprofen, naprosyn, etc., with food.  This will minimize stomach upset from the medication.  Should you develop nausea and vomiting from the pain medication, or develop a rash, please discontinue the medication and contact your physician.  You should not drive, make important decisions, or operate machinery when taking narcotic pain medication.  SUNBLOCK Use sun block to incision area over the next year if this area will be exposed to sun. This helps decrease scarring and will allow you avoid a permanent darkened area over your incision.  QUESTIONS:  Please feel free to call our office if you have any questions, and we will be glad to assist you. [phone]   

## 2021-12-28 ENCOUNTER — Encounter: Payer: Self-pay | Admitting: Surgery

## 2021-12-28 NOTE — Progress Notes (Signed)
[name] is 2-1/2 weeks after open left inguinal hernia repair.  He is doing well.  No fevers no chills some soreness.  PE NAD AbD; soft incision healing well without infection.  Suspected induration.  A/p-year-old doing well without complications. RTC as needed

## 2022-01-05 ENCOUNTER — Emergency Department: Payer: Medicare Other

## 2022-01-05 ENCOUNTER — Emergency Department
Admission: EM | Admit: 2022-01-05 | Discharge: 2022-01-05 | Disposition: A | Discharge status: Home / Self Care | Payer: Medicare Other | Attending: Emergency Medicine | Admitting: Emergency Medicine

## 2022-01-05 DIAGNOSIS — R079 Chest pain, unspecified: Secondary | ICD-10-CM | POA: Insufficient documentation

## 2022-01-05 DIAGNOSIS — R1013 Epigastric pain: Secondary | ICD-10-CM | POA: Diagnosis present

## 2022-01-05 DIAGNOSIS — R110 Nausea: Secondary | ICD-10-CM | POA: Insufficient documentation

## 2022-01-05 LAB — CBC WITH DIFFERENTIAL/PLATELET
Abs Immature Granulocytes: 0.04 10*3/uL (ref 0.00–0.07)
Basophils Absolute: 0 10*3/uL (ref 0.0–0.1)
Basophils Relative: 1 %
Eosinophils Absolute: 0 10*3/uL (ref 0.0–0.5)
Eosinophils Relative: 1 %
HCT: 38.2 % — ABNORMAL LOW (ref 39.0–52.0)
Hemoglobin: 12.4 g/dL — ABNORMAL LOW (ref 13.0–17.0)
Immature Granulocytes: 1 %
Lymphocytes Relative: 21 %
Lymphs Abs: 1.1 10*3/uL (ref 0.7–4.0)
MCH: 30.7 pg (ref 26.0–34.0)
MCHC: 32.5 g/dL (ref 30.0–36.0)
MCV: 94.6 fL (ref 80.0–100.0)
Monocytes Absolute: 0.6 10*3/uL (ref 0.1–1.0)
Monocytes Relative: 11 %
Neutro Abs: 3.5 10*3/uL (ref 1.7–7.7)
Neutrophils Relative %: 65 %
Platelets: 199 10*3/uL (ref 150–400)
RBC: 4.04 MIL/uL — ABNORMAL LOW (ref 4.22–5.81)
RDW: 17.6 % — ABNORMAL HIGH (ref 11.5–15.5)
WBC: 5.2 10*3/uL (ref 4.0–10.5)
nRBC: 0 % (ref 0.0–0.2)

## 2022-01-05 LAB — COMPREHENSIVE METABOLIC PANEL
ALT: 65 U/L — ABNORMAL HIGH (ref 0–44)
AST: 54 U/L — ABNORMAL HIGH (ref 15–41)
Albumin: 4.7 g/dL (ref 3.5–5.0)
Alkaline Phosphatase: 58 U/L (ref 38–126)
Anion gap: 13 (ref 5–15)
BUN: 10 mg/dL (ref 8–23)
CO2: 20 mmol/L — ABNORMAL LOW (ref 22–32)
Calcium: 9 mg/dL (ref 8.9–10.3)
Chloride: 102 mmol/L (ref 98–111)
Creatinine, Ser: 0.89 mg/dL (ref 0.61–1.24)
GFR, Estimated: >60 mL/min (ref >60)
Glucose, Bld: 89 mg/dL (ref 70–99)
Potassium: 4.4 mmol/L (ref 3.5–5.1)
Sodium: 135 mmol/L (ref 135–145)
Total Bilirubin: 1.3 mg/dL — ABNORMAL HIGH (ref 0.3–1.2)
Total Protein: 7.6 g/dL (ref 6.5–8.1)

## 2022-01-05 LAB — HEPATITIS PANEL, ACUTE
HCV Ab: NONREACTIVE
Hep A IgM: NONREACTIVE
Hep B C IgM: NONREACTIVE
Hepatitis B Surface Ag: NONREACTIVE

## 2022-01-05 LAB — LACTIC ACID, PLASMA: Lactic Acid, Venous: 1.2 mmol/L (ref 0.5–1.9)

## 2022-01-05 LAB — TROPONIN I (HIGH SENSITIVITY)
Troponin I (High Sensitivity): 7 ng/L (ref <18)
Troponin I (High Sensitivity): 8 ng/L (ref <18)

## 2022-01-05 LAB — LIPASE, BLOOD: Lipase: 25 U/L (ref 11–51)

## 2022-01-05 MED ORDER — OMEPRAZOLE 40 MG PO CPDR: 40.0000 mg | DELAYED_RELEASE_CAPSULE | Freq: Every day | ORAL | 1 refills | Status: AC

## 2022-01-05 MED ORDER — LIDOCAINE VISCOUS HCL 2 % MT SOLN
15.0000 mL | Freq: Once | OROMUCOSAL | Status: AC
  Administered 2022-01-05: 15 mL via ORAL
  Filled 2022-01-05: qty 15

## 2022-01-05 MED ORDER — ALUM & MAG HYDROXIDE-SIMETH 200-200-20 MG/5ML PO SUSP
30.0000 mL | Freq: Once | ORAL | Status: AC
  Administered 2022-01-05: 30 mL via ORAL
  Filled 2022-01-05: qty 30

## 2022-01-05 NOTE — ED Provider Notes (Signed)
[facility] Provider Note    Event Date/Time   First MD Initiated Contact with Patient 01/05/22 [phone]     (approximate)   History   Abdominal Pain and Chest Pain   HPI  [name] is a 66 y.o. male who woke up at 630 this morning with epigastric pain and squeezing.  It is much improved since onset.  Patient had no other symptoms with that except for possibly some nausea.  Patient has not had this before.  Patient's past history significant for having bowel removed for eosinophilic enteritis and recurrent inguinal hernias with repair at the beginning of this month.  Patient is also had a TIA.    Patient drinks as much is 3 shots a day but not every day patient smokes as much as a half a pack a day but not every day there are days when he does not smoke or drink. ----------------------------------------- 11:35 AM on 01/05/2022 -----------------------------------------  No response to GI cocktail patient did have pain rating to the back its not currently.  It is post still there but much much better.  Initial troponin is negative EKG does not show any acute findings.  Physical Exam   Triage Vital Signs: ED Triage Vitals  Enc Vitals Group     BP 01/05/22 0858 (!) 144/81     Pulse Rate 01/05/22 0858 (!) 110     Resp 01/05/22 0858 18     Temp 01/05/22 0858 98.7 F (37.1 C)     Temp Source 01/05/22 0858 Oral     SpO2 01/05/22 0858 98 %     Weight --      Height --      Head Circumference --      Peak Flow --      Pain Score 01/05/22 0856 2     Pain Loc --      Pain Edu? --      Excl. in [place]? --     Most recent vital signs: Vitals:   01/05/22 1230 01/05/22 1300  BP: (!) 145/87 138/81  Pulse: 98 96  Resp: 20 16  Temp:    SpO2: 92% 96%     General: Awake, no distress.  CV:  Good peripheral perfusion.  Heart regular rate and rhythm no audible murmurs Resp:  Normal effort.  Lungs are clear Abd:  No distention.  Abdomen soft bowel  sounds are positive there is minimal epigastric tenderness at this time. Extremities: No edema   ED Results / Procedures / Treatments   Labs (all labs ordered are listed, but only abnormal results are displayed) Labs Reviewed  COMPREHENSIVE METABOLIC PANEL - Abnormal; Notable for the following components:      Result Value   CO2 20 (*)    AST 54 (*)    ALT 65 (*)    Total Bilirubin 1.3 (*)    All other components within normal limits  CBC WITH DIFFERENTIAL/PLATELET - Abnormal; Notable for the following components:   RBC 4.04 (*)    Hemoglobin 12.4 (*)    HCT 38.2 (*)    RDW 17.6 (*)    All other components within normal limits  LACTIC ACID, PLASMA  LIPASE, BLOOD  HEPATITIS PANEL, ACUTE  TROPONIN I (HIGH SENSITIVITY)  TROPONIN I (HIGH SENSITIVITY)     EKG  He KG read interpreted by me shows sinus tachycardia rate of 108 slight left axis right bundle branch block there is slight ST elevation in V45  and 6 which is old having a present on EKG from 622 of this year.  In fact this EKG looks almost exactly the same as the previous one.   RADIOLOGY X-ray read by radiology reviewed and interpreted again by me shows no acute disease.   PROCEDURES:  Critical Care performed:   Procedures   MEDICATIONS ORDERED IN ED: Medications  alum & mag hydroxide-simeth (MAALOX/MYLANTA) 200-200-20 MG/5ML suspension 30 mL (30 mLs Oral Given 01/05/22 0931)    And  lidocaine (XYLOCAINE) 2 % viscous mouth solution 15 mL (15 mLs Oral Given 01/05/22 0931)     IMPRESSION / MDM / ASSESSMENT AND PLAN / ED COURSE  I reviewed the triage vital signs and the nursing notes.  Patient's troponins are negative his lab work is otherwise unrevealing except for minimal elevation of AST and ALT.  Ultrasound was negative except for possible hepatic steatosis EKG was negative.  I will have the patient follow-up with cardiology just in case he is old enough and has enough medical problems that he would be at risk  for heart trouble.  I will also increase his Prilosec for now and have him start an aspirin every day until he can see cardiology on Monday.  He will return if she has any further problems Differential diagnosis includes, but is not limited to,   Patient's presentation is most consistent with possible gastritis or peptic ulcer or angina.  No tests are returning up anything in particular that would point to a specific etiology.  There is no sign of any obstruction for example or pancreatitis both of which could have caused this. I was able to contact Dr. Juanda Bond quite some time later and he did agree to see the patient on Monday. The patient is on the cardiac monitor to evaluate for evidence of arrhythmia and/or significant heart rate changes.  None are seen      FINAL CLINICAL IMPRESSION(S) / ED DIAGNOSES   Final diagnoses:  Epigastric pain     Rx / DC Orders   ED Discharge Orders          Ordered    omeprazole (PRILOSEC) 40 MG capsule  Daily        01/05/22 1321             Note:  This document was prepared using Dragon voice recognition software and may include unintentional dictation errors.   Nena Polio, MD 01/05/22 [phone]

## 2022-01-05 NOTE — ED Triage Notes (Signed)
Pt comes ems from home with epigastric/ squeezing chest pain starting this morning. Pt took '324mg'$  aspirin prior to EMS arrival. EKG showed RBBB per ems. Nausea but no emesis. 20G L AC. 160/100.

## 2022-01-05 NOTE — Discharge Instructions (Addendum)
Please take Prilosec 40 mg once a day.  You can either take the new prescription I gave you or if you still have some old.  Take 2 of them once a day for now.  Please take 1 regular aspirin once a day until you see the cardiologist on Monday.  Make sure you are taking the Prilosec when you do that so you do not stir up any stomach problems.  Please return here if your worse that is any stomach or epigastric tightness or chest tightness or any other discomfort in that area.  Please follow-up with Dr. Andi Devon, the cardiologist.  Give his office a call Monday morning let him know you were seen here in the emergency room he likely will be to see you Monday and certainly by Tuesday.

## 2022-05-21 ENCOUNTER — Other Ambulatory Visit: Payer: Self-pay | Admitting: Internal Medicine

## 2022-05-24 ENCOUNTER — Other Ambulatory Visit: Payer: Self-pay

## 2022-05-24 MED ORDER — AMLODIPINE BESYLATE 5 MG PO TABS: 5.0000 mg | ORAL_TABLET | Freq: Every day | ORAL | 1 refills | Status: AC

## 2022-07-10 DIAGNOSIS — K219 Gastro-esophageal reflux disease without esophagitis: Secondary | ICD-10-CM | POA: Diagnosis not present

## 2022-07-10 DIAGNOSIS — R634 Abnormal weight loss: Secondary | ICD-10-CM | POA: Diagnosis not present

## 2022-07-10 DIAGNOSIS — R7989 Other specified abnormal findings of blood chemistry: Secondary | ICD-10-CM | POA: Diagnosis not present

## 2022-07-10 DIAGNOSIS — F17210 Nicotine dependence, cigarettes, uncomplicated: Secondary | ICD-10-CM | POA: Diagnosis not present

## 2022-07-10 DIAGNOSIS — R5383 Other fatigue: Secondary | ICD-10-CM | POA: Diagnosis not present

## 2022-07-10 DIAGNOSIS — Z131 Encounter for screening for diabetes mellitus: Secondary | ICD-10-CM | POA: Diagnosis not present

## 2022-07-10 DIAGNOSIS — I10 Essential (primary) hypertension: Secondary | ICD-10-CM | POA: Diagnosis not present

## 2022-07-10 DIAGNOSIS — E538 Deficiency of other specified B group vitamins: Secondary | ICD-10-CM | POA: Diagnosis not present

## 2022-07-10 DIAGNOSIS — Z125 Encounter for screening for malignant neoplasm of prostate: Secondary | ICD-10-CM | POA: Diagnosis not present

## 2022-08-01 ENCOUNTER — Emergency Department
Admission: EM | Admit: 2022-08-01 | Discharge: 2022-08-01 | Disposition: A | Discharge status: Home / Self Care | Payer: Medicare HMO | Attending: Emergency Medicine | Admitting: Emergency Medicine

## 2022-08-01 ENCOUNTER — Emergency Department: Payer: Medicare HMO

## 2022-08-01 ENCOUNTER — Other Ambulatory Visit: Payer: Self-pay | Admitting: Family Medicine

## 2022-08-01 DIAGNOSIS — Z1152 Encounter for screening for COVID-19: Secondary | ICD-10-CM | POA: Insufficient documentation

## 2022-08-01 DIAGNOSIS — J441 Chronic obstructive pulmonary disease with (acute) exacerbation: Secondary | ICD-10-CM | POA: Insufficient documentation

## 2022-08-01 DIAGNOSIS — I10 Essential (primary) hypertension: Secondary | ICD-10-CM | POA: Diagnosis not present

## 2022-08-01 DIAGNOSIS — R42 Dizziness and giddiness: Secondary | ICD-10-CM | POA: Diagnosis not present

## 2022-08-01 DIAGNOSIS — R0602 Shortness of breath: Secondary | ICD-10-CM | POA: Diagnosis present

## 2022-08-01 DIAGNOSIS — R000 Tachycardia, unspecified: Secondary | ICD-10-CM | POA: Diagnosis not present

## 2022-08-01 DIAGNOSIS — G4489 Other headache syndrome: Secondary | ICD-10-CM | POA: Diagnosis not present

## 2022-08-01 DIAGNOSIS — F17210 Nicotine dependence, cigarettes, uncomplicated: Secondary | ICD-10-CM

## 2022-08-01 DIAGNOSIS — R5383 Other fatigue: Secondary | ICD-10-CM | POA: Diagnosis not present

## 2022-08-01 DIAGNOSIS — R079 Chest pain, unspecified: Secondary | ICD-10-CM | POA: Diagnosis not present

## 2022-08-01 DIAGNOSIS — R0789 Other chest pain: Secondary | ICD-10-CM | POA: Diagnosis not present

## 2022-08-01 LAB — RESP PANEL BY RT-PCR (RSV, FLU A&B, COVID)  RVPGX2
Influenza A by PCR: NEGATIVE
Influenza B by PCR: NEGATIVE
Resp Syncytial Virus by PCR: NEGATIVE
SARS Coronavirus 2 by RT PCR: NEGATIVE

## 2022-08-01 LAB — BASIC METABOLIC PANEL
Anion gap: 13 (ref 5–15)
BUN: 10 mg/dL (ref 8–23)
CO2: 21 mmol/L — ABNORMAL LOW (ref 22–32)
Calcium: 8.4 mg/dL — ABNORMAL LOW (ref 8.9–10.3)
Chloride: 104 mmol/L (ref 98–111)
Creatinine, Ser: 0.77 mg/dL (ref 0.61–1.24)
GFR, Estimated: >60 mL/min (ref >60)
Glucose, Bld: 98 mg/dL (ref 70–99)
Potassium: 3.2 mmol/L — ABNORMAL LOW (ref 3.5–5.1)
Sodium: 138 mmol/L (ref 135–145)

## 2022-08-01 LAB — CBC
HCT: 29.9 % — ABNORMAL LOW (ref 39.0–52.0)
Hemoglobin: 9.8 g/dL — ABNORMAL LOW (ref 13.0–17.0)
MCH: 31.1 pg (ref 26.0–34.0)
MCHC: 32.8 g/dL (ref 30.0–36.0)
MCV: 94.9 fL (ref 80.0–100.0)
Platelets: 225 10*3/uL (ref 150–400)
RBC: 3.15 MIL/uL — ABNORMAL LOW (ref 4.22–5.81)
RDW: 21.1 % — ABNORMAL HIGH (ref 11.5–15.5)
WBC: 5.8 10*3/uL (ref 4.0–10.5)
nRBC: 0 % (ref 0.0–0.2)

## 2022-08-01 LAB — TROPONIN I (HIGH SENSITIVITY)
Troponin I (High Sensitivity): 6 ng/L (ref <18)
Troponin I (High Sensitivity): 9 ng/L (ref <18)

## 2022-08-01 MED ORDER — PREDNISONE 20 MG PO TABS: 40.0000 mg | ORAL_TABLET | Freq: Every day | ORAL | 0 refills | Status: AC

## 2022-08-01 MED ORDER — IPRATROPIUM-ALBUTEROL 0.5-2.5 (3) MG/3ML IN SOLN
3.0000 mL | Freq: Once | RESPIRATORY_TRACT | Status: AC
  Administered 2022-08-01: 3 mL via RESPIRATORY_TRACT
  Filled 2022-08-01: qty 3

## 2022-08-01 MED ORDER — SODIUM CHLORIDE 0.9 % IV BOLUS
1000.0000 mL | Freq: Once | INTRAVENOUS | Status: AC
  Administered 2022-08-01: 1000 mL via INTRAVENOUS

## 2022-08-01 NOTE — ED Notes (Signed)
Pt verbalizes understanding of discharge instructions. Opportunity for questioning and answers were provided. Pt discharged from ED to home with family.    

## 2022-08-01 NOTE — ED Triage Notes (Signed)
Pt presents to the ED with [facility] from home. EMS called out for [facility] and dizziness. Upon arrival pt was diaphoretic with a pressure of 219/100. Pt diaphoretic at time of triage. A&Ox4. Reports improved SHOB.  Pt took '162mg'$  of aspirin this morning and EMS gave '162mg'$  of Aspirin and given enroute. 1 sublingual nitro was also given by EMS. Pt denies CP at this time. Reports chest pressure prior to aspirin. EKG obtained and given to provider.

## 2022-08-01 NOTE — ED Provider Notes (Signed)
[facility] Provider Note    Event Date/Time   First MD Initiated Contact with Patient 08/01/22 1504     (approximate)   History   Chief Complaint: Dizziness   HPI  [name] is a 67 y.o. male with a history of hypertension, COPD, prior stroke who comes ED complaining of shortness of breath and fatigue that started earlier today while sitting on the couch.  No specific chest pain.  No exertional symptoms, not pleuritic.  No vomiting but did have some diaphoresis.  Currently reports that he is feeling better.  No fever, no productive cough.  No recent travel trauma hospitalization or surgery.  No history of DVT.     Physical Exam   Triage Vital Signs: ED Triage Vitals  Enc Vitals Group     BP 08/01/22 1451 (!) 151/82     Pulse Rate 08/01/22 1451 (!) 113     Resp 08/01/22 1451 15     Temp 08/01/22 1451 98.8 F (37.1 C)     Temp Source 08/01/22 1451 Oral     SpO2 08/01/22 1444 100 %     Weight 08/01/22 1448 227 lb (103 kg)     Height 08/01/22 1448 '5\' 11"'$  (1.803 m)     Head Circumference --      Peak Flow --      Pain Score 08/01/22 1448 0     Pain Loc --      Pain Edu? --      Excl. in [place]? --     Most recent vital signs: Vitals:   08/01/22 1830 08/01/22 1900  BP:  139/82  Pulse: 98 98  Resp: 14 15  Temp:    SpO2: 98% 99%    General: Awake, no distress.  CV:  Good peripheral perfusion.  Regular rate and rhythm Resp:  Normal effort.  Mildly prolonged expiratory phase and end expiratory wheezing.  Good air entry bilaterally. Abd:  No distention.  Soft nontender Other:  No lower extremity edema or calf tenderness   ED Results / Procedures / Treatments   Labs (all labs ordered are listed, but only abnormal results are displayed) Labs Reviewed  BASIC METABOLIC PANEL - Abnormal; Notable for the following components:      Result Value   Potassium 3.2 (*)    CO2 21 (*)    Calcium 8.4 (*)    All other components within  normal limits  CBC - Abnormal; Notable for the following components:   RBC 3.15 (*)    Hemoglobin 9.8 (*)    HCT 29.9 (*)    RDW 21.1 (*)    All other components within normal limits  RESP PANEL BY RT-PCR (RSV, FLU A&B, COVID)  RVPGX2  TROPONIN I (HIGH SENSITIVITY)  TROPONIN I (HIGH SENSITIVITY)     EKG Interpreted by me Sinus tachycardia rate 114.  Left axis, right bundle branch block.  No acute ischemic changes.  Compared to previous EKG January 05, 2022, no significant interval change.   RADIOLOGY Chest x-ray interpreted by me, appears normal.  Radiology report reviewed.   PROCEDURES:  Procedures   MEDICATIONS ORDERED IN ED: Medications  sodium chloride 0.9 % bolus 1,000 mL (1,000 mLs Intravenous New Bag/Given 08/01/22 1708)  ipratropium-albuterol (DUONEB) 0.5-2.5 (3) MG/3ML nebulizer solution 3 mL (3 mLs Nebulization Given 08/01/22 1709)     IMPRESSION / MDM / ASSESSMENT AND PLAN / ED COURSE  I reviewed the triage vital signs and the nursing  notes.                              Differential diagnosis includes, but is not limited to, COPD exacerbation, non-STEMI, dehydration, AKI, electrolyte abnormality, pleural effusion, pneumothorax  Patient's presentation is most consistent with acute presentation with potential threat to life or bodily function.  Patient presents with shortness of breath, exam consistent with bronchospasm.  Suspect COPD exacerbation.  Patient given a DuoNeb and some IV fluids for hydration.  EKG chest x-ray and labs including serial troponins are all reassuring.  On reassessment after DuoNeb, lungs are clear to auscultation bilaterally without wheezing.  Patient reports symptoms have resolved.  Stable for discharge home, prescription for prednisone.       FINAL CLINICAL IMPRESSION(S) / ED DIAGNOSES   Final diagnoses:  COPD exacerbation ([place])     Rx / DC Orders   ED Discharge Orders          Ordered    predniSONE (DELTASONE) 20 MG tablet   Daily with breakfast        08/01/22 1931             Note:  This document was prepared using Dragon voice recognition software and may include unintentional dictation errors.   Carrie Mew, MD 08/01/22 Joen [place]

## 2022-08-08 DIAGNOSIS — H2511 Age-related nuclear cataract, right eye: Secondary | ICD-10-CM | POA: Diagnosis not present

## 2022-08-08 DIAGNOSIS — H401134 Primary open-angle glaucoma, bilateral, indeterminate stage: Secondary | ICD-10-CM | POA: Diagnosis not present

## 2022-08-08 DIAGNOSIS — H2512 Age-related nuclear cataract, left eye: Secondary | ICD-10-CM | POA: Diagnosis not present

## 2022-08-08 DIAGNOSIS — Z0100 Encounter for examination of eyes and vision without abnormal findings: Secondary | ICD-10-CM | POA: Diagnosis not present

## 2022-08-08 DIAGNOSIS — H2513 Age-related nuclear cataract, bilateral: Secondary | ICD-10-CM | POA: Diagnosis not present

## 2022-08-09 ENCOUNTER — Ambulatory Visit
Admission: RE | Admit: 2022-08-09 | Discharge: 2022-08-09 | Disposition: A | Discharge status: Home / Self Care | Payer: Medicare HMO | Source: Ambulatory Visit | Attending: Family Medicine | Admitting: Family Medicine

## 2022-08-09 DIAGNOSIS — H2511 Age-related nuclear cataract, right eye: Secondary | ICD-10-CM | POA: Diagnosis not present

## 2022-08-09 DIAGNOSIS — F17210 Nicotine dependence, cigarettes, uncomplicated: Secondary | ICD-10-CM | POA: Diagnosis not present

## 2022-08-09 DIAGNOSIS — H401134 Primary open-angle glaucoma, bilateral, indeterminate stage: Secondary | ICD-10-CM | POA: Diagnosis not present

## 2022-08-09 DIAGNOSIS — H2512 Age-related nuclear cataract, left eye: Secondary | ICD-10-CM | POA: Diagnosis not present

## 2022-08-09 DIAGNOSIS — Z0100 Encounter for examination of eyes and vision without abnormal findings: Secondary | ICD-10-CM | POA: Diagnosis not present

## 2022-08-29 DIAGNOSIS — R5383 Other fatigue: Secondary | ICD-10-CM | POA: Diagnosis not present

## 2022-08-29 DIAGNOSIS — R7989 Other specified abnormal findings of blood chemistry: Secondary | ICD-10-CM | POA: Diagnosis not present

## 2022-08-29 DIAGNOSIS — I10 Essential (primary) hypertension: Secondary | ICD-10-CM | POA: Diagnosis not present

## 2022-08-29 DIAGNOSIS — K219 Gastro-esophageal reflux disease without esophagitis: Secondary | ICD-10-CM | POA: Diagnosis not present

## 2022-08-30 DIAGNOSIS — H401134 Primary open-angle glaucoma, bilateral, indeterminate stage: Secondary | ICD-10-CM | POA: Diagnosis not present

## 2022-09-02 DIAGNOSIS — R5383 Other fatigue: Secondary | ICD-10-CM | POA: Diagnosis not present

## 2022-09-02 DIAGNOSIS — I10 Essential (primary) hypertension: Secondary | ICD-10-CM | POA: Diagnosis not present

## 2022-09-02 DIAGNOSIS — F17210 Nicotine dependence, cigarettes, uncomplicated: Secondary | ICD-10-CM | POA: Diagnosis not present

## 2022-09-02 DIAGNOSIS — K219 Gastro-esophageal reflux disease without esophagitis: Secondary | ICD-10-CM | POA: Diagnosis not present

## 2022-09-02 DIAGNOSIS — J449 Chronic obstructive pulmonary disease, unspecified: Secondary | ICD-10-CM | POA: Diagnosis not present

## 2022-09-04 ENCOUNTER — Other Ambulatory Visit: Payer: Self-pay | Admitting: Nurse Practitioner

## 2022-09-13 DIAGNOSIS — G8929 Other chronic pain: Secondary | ICD-10-CM | POA: Diagnosis not present

## 2022-09-13 DIAGNOSIS — M545 Low back pain, unspecified: Secondary | ICD-10-CM | POA: Diagnosis not present

## 2022-09-13 DIAGNOSIS — D649 Anemia, unspecified: Secondary | ICD-10-CM | POA: Diagnosis not present

## 2022-09-18 DIAGNOSIS — K219 Gastro-esophageal reflux disease without esophagitis: Secondary | ICD-10-CM | POA: Diagnosis not present

## 2022-09-18 DIAGNOSIS — Z720 Tobacco use: Secondary | ICD-10-CM | POA: Diagnosis not present

## 2022-09-18 DIAGNOSIS — I2510 Atherosclerotic heart disease of native coronary artery without angina pectoris: Secondary | ICD-10-CM | POA: Diagnosis not present

## 2022-09-18 DIAGNOSIS — I10 Essential (primary) hypertension: Secondary | ICD-10-CM | POA: Diagnosis not present

## 2022-09-18 DIAGNOSIS — F1010 Alcohol abuse, uncomplicated: Secondary | ICD-10-CM | POA: Diagnosis not present

## 2022-09-18 DIAGNOSIS — E782 Mixed hyperlipidemia: Secondary | ICD-10-CM | POA: Diagnosis not present

## 2022-09-18 DIAGNOSIS — G459 Transient cerebral ischemic attack, unspecified: Secondary | ICD-10-CM | POA: Diagnosis not present

## 2022-09-18 DIAGNOSIS — R9439 Abnormal result of other cardiovascular function study: Secondary | ICD-10-CM | POA: Diagnosis not present

## 2022-09-19 DIAGNOSIS — E782 Mixed hyperlipidemia: Secondary | ICD-10-CM | POA: Diagnosis not present

## 2022-09-19 DIAGNOSIS — D649 Anemia, unspecified: Secondary | ICD-10-CM | POA: Diagnosis not present

## 2022-09-20 ENCOUNTER — Other Ambulatory Visit: Payer: Self-pay | Admitting: Internal Medicine

## 2022-09-20 DIAGNOSIS — I2510 Atherosclerotic heart disease of native coronary artery without angina pectoris: Secondary | ICD-10-CM

## 2022-10-02 ENCOUNTER — Telehealth (HOSPITAL_COMMUNITY): Payer: Self-pay | Admitting: *Deleted

## 2022-10-02 MED ORDER — METOPROLOL TARTRATE 100 MG PO TABS: ORAL_TABLET | ORAL | 0 refills | Status: AC

## 2022-10-02 MED ORDER — IVABRADINE HCL 7.5 MG PO TABS: ORAL_TABLET | ORAL | 0 refills | Status: AC

## 2022-10-02 NOTE — Telephone Encounter (Signed)
Reaching out to patient to offer assistance regarding upcoming cardiac imaging study; pt verbalizes understanding of appt date/time, parking situation and where to check in, pre-test NPO status and medications ordered, and verified current allergies; name and call back number provided for further questions should they arise ? ?Andrik Sandt RN Navigator Cardiac Imaging ?[facility] ?[phone] office ?[phone] cell ? ?Patient to take 100mg metoprolol tartrate and 15mg ivabradine two hours prior to his cardiac CT scan. ?

## 2022-10-03 ENCOUNTER — Ambulatory Visit
Admission: RE | Admit: 2022-10-03 | Discharge: 2022-10-03 | Disposition: A | Discharge status: Home / Self Care | Payer: Medicare HMO | Source: Ambulatory Visit | Attending: Internal Medicine | Admitting: Internal Medicine

## 2022-10-03 DIAGNOSIS — I2510 Atherosclerotic heart disease of native coronary artery without angina pectoris: Secondary | ICD-10-CM | POA: Diagnosis not present

## 2022-10-03 DIAGNOSIS — D649 Anemia, unspecified: Secondary | ICD-10-CM | POA: Diagnosis not present

## 2022-10-03 MED ORDER — DILTIAZEM HCL 25 MG/5ML IV SOLN
10.0000 mg | Freq: Once | INTRAVENOUS | Status: AC
  Administered 2022-10-03: 10 mg via INTRAVENOUS

## 2022-10-03 MED ORDER — IOHEXOL 350 MG/ML SOLN: 75.0000 mL | Freq: Once | INTRAVENOUS | Status: DC | PRN

## 2022-10-03 MED ORDER — NITROGLYCERIN 0.4 MG SL SUBL: 0.8000 mg | SUBLINGUAL_TABLET | Freq: Once | SUBLINGUAL | Status: DC

## 2022-10-03 MED ORDER — METOPROLOL TARTRATE 5 MG/5ML IV SOLN
10.0000 mg | Freq: Once | INTRAVENOUS | Status: AC
  Administered 2022-10-03: 10 mg via INTRAVENOUS

## 2022-10-08 DIAGNOSIS — R9439 Abnormal result of other cardiovascular function study: Secondary | ICD-10-CM | POA: Diagnosis not present

## 2022-10-08 DIAGNOSIS — I10 Essential (primary) hypertension: Secondary | ICD-10-CM | POA: Diagnosis not present

## 2022-10-10 ENCOUNTER — Ambulatory Visit: Admission: RE | Admit: 2022-10-10 | Payer: Medicare HMO | Source: Ambulatory Visit

## 2022-10-21 DIAGNOSIS — Z720 Tobacco use: Secondary | ICD-10-CM | POA: Diagnosis not present

## 2022-10-21 DIAGNOSIS — F1010 Alcohol abuse, uncomplicated: Secondary | ICD-10-CM | POA: Diagnosis not present

## 2022-10-21 DIAGNOSIS — I700 Atherosclerosis of aorta: Secondary | ICD-10-CM | POA: Diagnosis not present

## 2022-10-21 DIAGNOSIS — Z8673 Personal history of transient ischemic attack (TIA), and cerebral infarction without residual deficits: Secondary | ICD-10-CM | POA: Diagnosis not present

## 2022-10-21 DIAGNOSIS — D649 Anemia, unspecified: Secondary | ICD-10-CM | POA: Diagnosis not present

## 2022-10-21 DIAGNOSIS — I2089 Other forms of angina pectoris: Secondary | ICD-10-CM | POA: Diagnosis not present

## 2022-10-21 DIAGNOSIS — K219 Gastro-esophageal reflux disease without esophagitis: Secondary | ICD-10-CM | POA: Diagnosis not present

## 2022-10-21 DIAGNOSIS — I10 Essential (primary) hypertension: Secondary | ICD-10-CM | POA: Diagnosis not present

## 2022-10-22 ENCOUNTER — Other Ambulatory Visit (HOSPITAL_COMMUNITY): Payer: Self-pay | Admitting: Internal Medicine

## 2022-10-22 DIAGNOSIS — I2089 Other forms of angina pectoris: Secondary | ICD-10-CM

## 2022-11-25 ENCOUNTER — Encounter (HOSPITAL_COMMUNITY): Payer: Self-pay

## 2022-11-25 ENCOUNTER — Other Ambulatory Visit (HOSPITAL_COMMUNITY): Payer: Self-pay | Admitting: *Deleted

## 2022-11-25 DIAGNOSIS — Z01810 Encounter for preprocedural cardiovascular examination: Secondary | ICD-10-CM

## 2022-11-25 DIAGNOSIS — I10 Essential (primary) hypertension: Secondary | ICD-10-CM

## 2022-11-28 ENCOUNTER — Telehealth (HOSPITAL_COMMUNITY): Payer: Self-pay | Admitting: *Deleted

## 2022-11-28 DIAGNOSIS — H401134 Primary open-angle glaucoma, bilateral, indeterminate stage: Secondary | ICD-10-CM | POA: Diagnosis not present

## 2022-11-28 NOTE — Telephone Encounter (Signed)
Attempted to call patient regarding upcoming cardiac MRI appointment. Left message on voicemail with name and callback number  Leilanni Halvorson RN Navigator Cardiac Imaging [facility] [phone] Office [phone] Cell  

## 2022-11-29 ENCOUNTER — Ambulatory Visit (HOSPITAL_COMMUNITY): Admission: RE | Admit: 2022-11-29 | Payer: Medicare HMO | Source: Ambulatory Visit

## 2022-11-29 ENCOUNTER — Other Ambulatory Visit (HOSPITAL_COMMUNITY): Payer: Medicare HMO

## 2023-02-03 DIAGNOSIS — I2089 Other forms of angina pectoris: Secondary | ICD-10-CM | POA: Diagnosis not present

## 2023-03-05 DIAGNOSIS — K219 Gastro-esophageal reflux disease without esophagitis: Secondary | ICD-10-CM | POA: Diagnosis not present

## 2023-03-05 DIAGNOSIS — J449 Chronic obstructive pulmonary disease, unspecified: Secondary | ICD-10-CM | POA: Diagnosis not present

## 2023-03-05 DIAGNOSIS — I10 Essential (primary) hypertension: Secondary | ICD-10-CM | POA: Diagnosis not present

## 2023-03-05 DIAGNOSIS — Z0000 Encounter for general adult medical examination without abnormal findings: Secondary | ICD-10-CM | POA: Diagnosis not present

## 2023-03-05 DIAGNOSIS — F17210 Nicotine dependence, cigarettes, uncomplicated: Secondary | ICD-10-CM | POA: Diagnosis not present

## 2023-03-05 DIAGNOSIS — Z1331 Encounter for screening for depression: Secondary | ICD-10-CM | POA: Diagnosis not present

## 2023-03-17 DIAGNOSIS — H2511 Age-related nuclear cataract, right eye: Secondary | ICD-10-CM | POA: Diagnosis not present

## 2023-03-17 DIAGNOSIS — H401134 Primary open-angle glaucoma, bilateral, indeterminate stage: Secondary | ICD-10-CM | POA: Diagnosis not present

## 2023-03-17 DIAGNOSIS — H2512 Age-related nuclear cataract, left eye: Secondary | ICD-10-CM | POA: Diagnosis not present

## 2023-05-28 IMAGING — US US SCROTUM W/ DOPPLER COMPLETE
1 series · 13 of 25 positions shown · non-contrast
Comparison: 09/30/2015 and CT of the abdomen and pelvis 11/27/2019

CLINICAL DATA: 65-year-old with scrotal pain and swelling.

EXAM:
SCROTAL ULTRASOUND
DOPPLER ULTRASOUND OF THE TESTICLES
TECHNIQUE: Complete ultrasound examination of the testicles, epididymis, and
other scrotal structures was performed. Color and spectral Doppler
ultrasound were also utilized to evaluate blood flow to the
testicles.

[Series 1: us scrotum w/doppler · 13 of 67 slices shown]
[im 1/67]
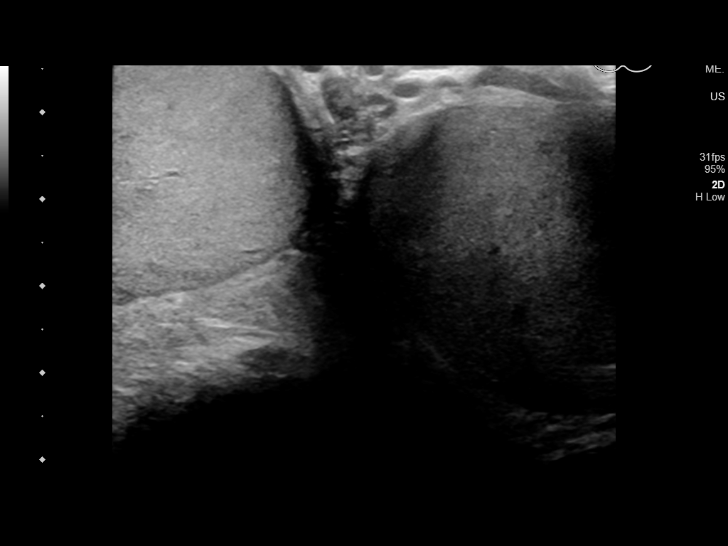
[im 6/67]
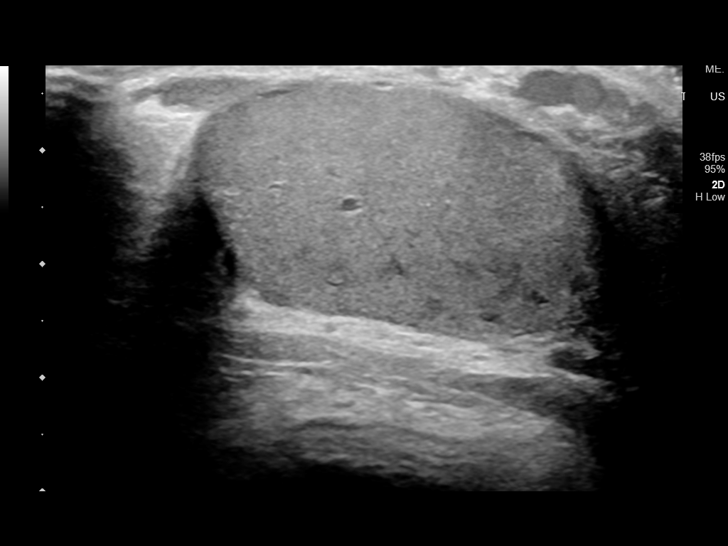
[im 12/67]
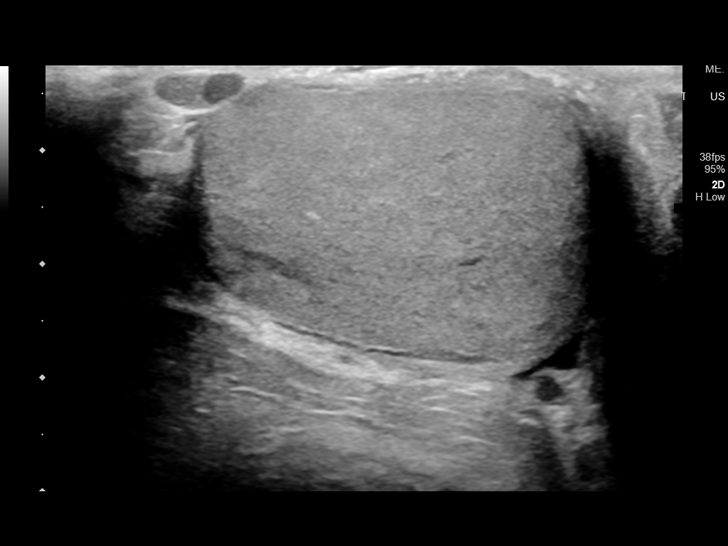
[im 17/67]
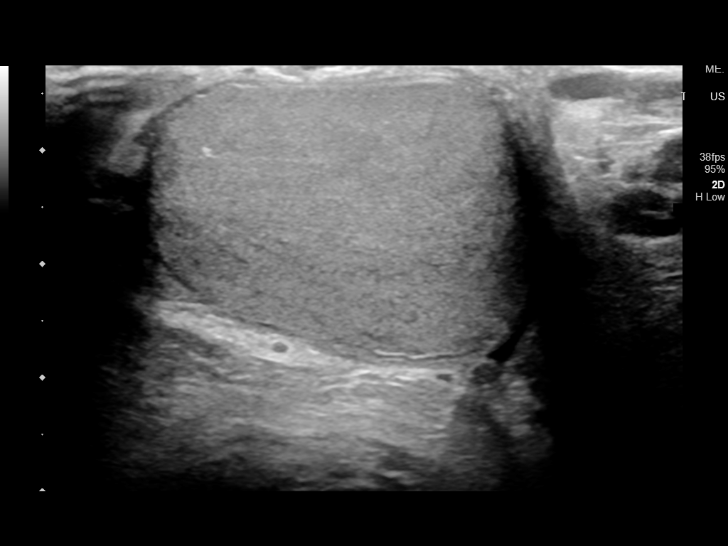
[im 23/67]
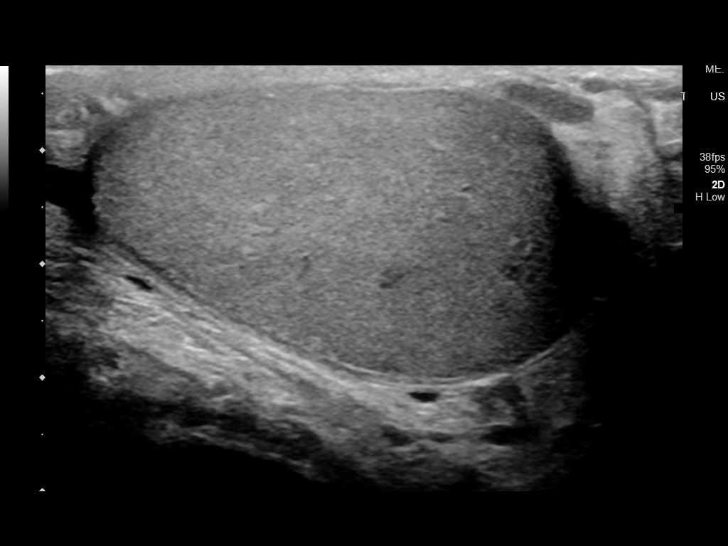
[im 28/67]
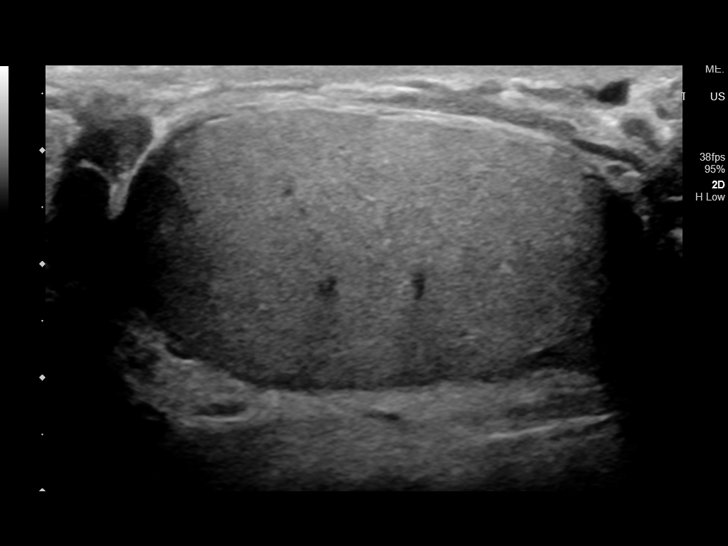
[im 34/67]
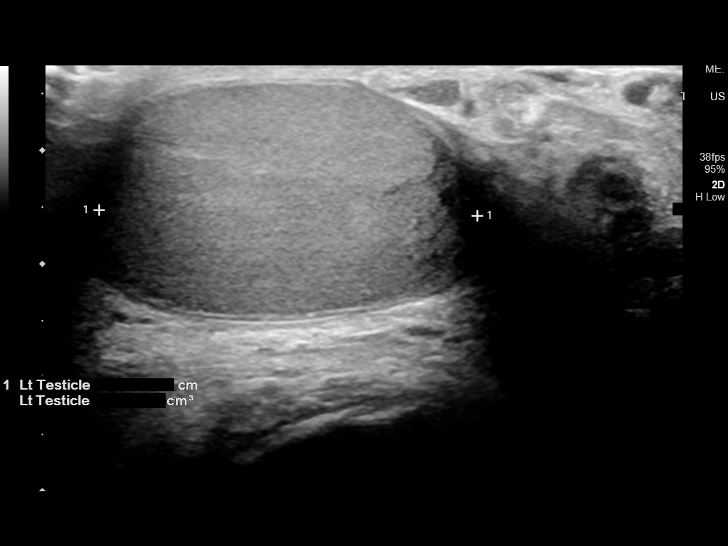
[im 39/67]
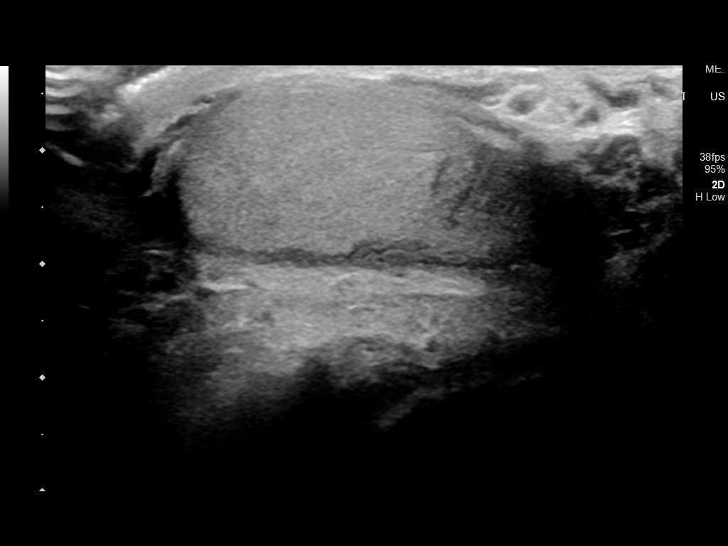
[im 45/67]
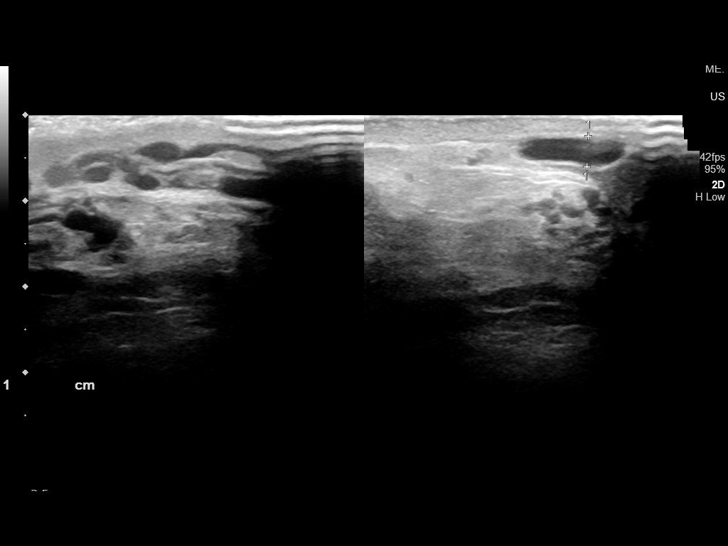
[im 50/67]
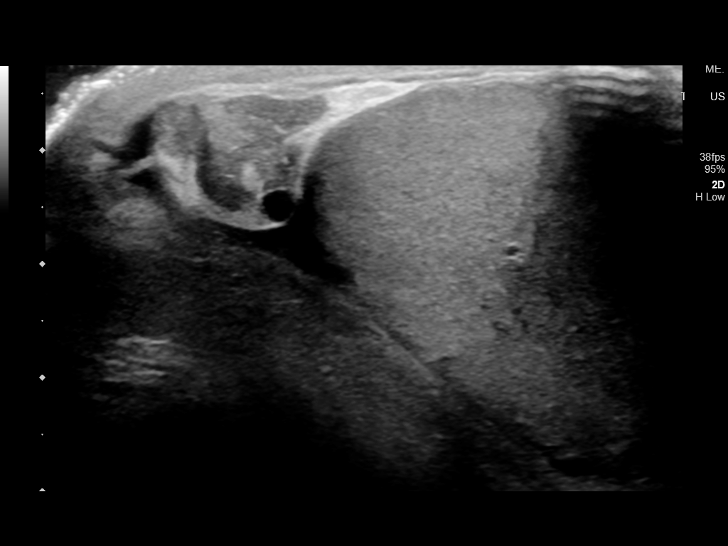
[im 56/67]
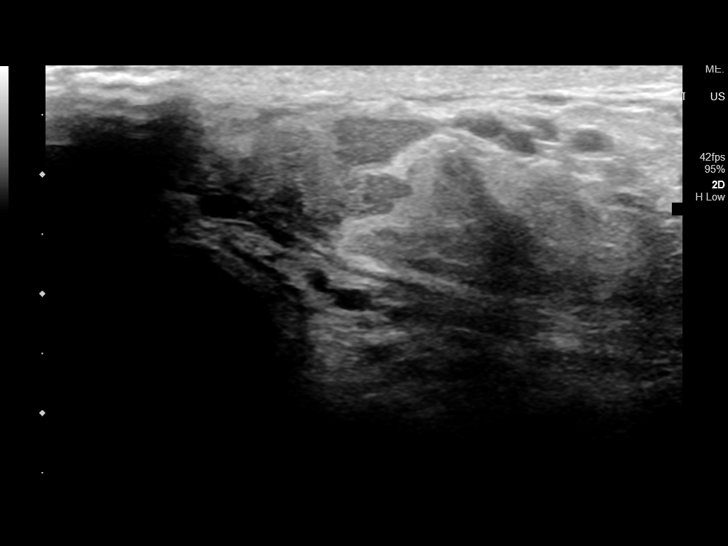
[im 61/67]
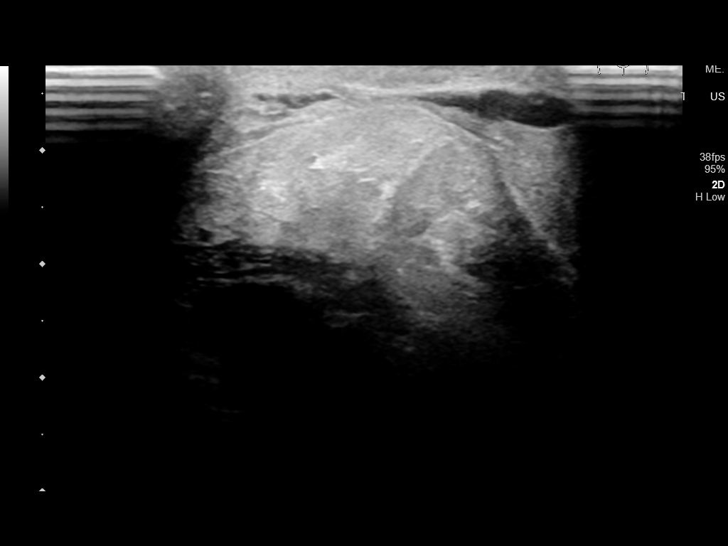
[im 67/67]
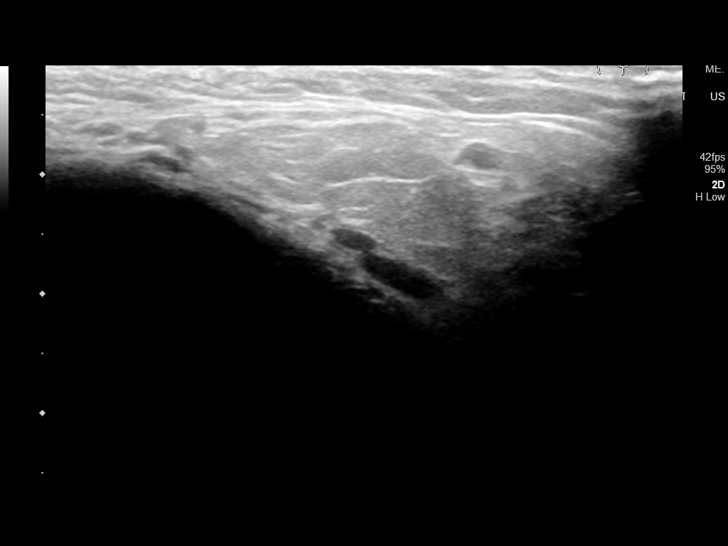

[13 of 25 positions shown; findings below may reference images not displayed]

FINDINGS: Right testicle

Measurements: 4.8 x 2.3 x 3.5 cm. No mass or microlithiasis
visualized.

Left testicle

Measurements: 4.4 x 2.6 x 3.3 cm. No mass or microlithiasis
visualized.

Right epididymis:  Normal in size and appearance.

Left epididymis:  Normal in size and appearance.

Hydrocele:  None visualized.

Varicocele:  None visualized.

Pulsed Doppler interrogation of both testes demonstrates normal low
resistance arterial and venous waveforms bilaterally.

Inguinal canals: Bilateral inguinal hernias. Patient has known
inguinal hernias based on previous CT. Inguinal hernias probably
contain fat and bowel structures.
IMPRESSION: 1. Normal appearance of both testicles and no evidence for
testicular torsion.
2. Bilateral inguinal hernias that may be containing bowel. This
could be better characterized with CT.

## 2023-06-20 DIAGNOSIS — H401134 Primary open-angle glaucoma, bilateral, indeterminate stage: Secondary | ICD-10-CM | POA: Diagnosis not present

## 2023-07-11 ENCOUNTER — Emergency Department
Admission: EM | Admit: 2023-07-11 | Discharge: 2023-07-11 | Disposition: A | Discharge status: Home / Self Care | Payer: Medicare HMO | Attending: Emergency Medicine | Admitting: Emergency Medicine

## 2023-07-11 ENCOUNTER — Emergency Department: Payer: Medicare HMO

## 2023-07-11 ENCOUNTER — Other Ambulatory Visit: Payer: Self-pay

## 2023-07-11 DIAGNOSIS — I10 Essential (primary) hypertension: Secondary | ICD-10-CM | POA: Insufficient documentation

## 2023-07-11 DIAGNOSIS — R079 Chest pain, unspecified: Secondary | ICD-10-CM | POA: Diagnosis not present

## 2023-07-11 DIAGNOSIS — D649 Anemia, unspecified: Secondary | ICD-10-CM | POA: Insufficient documentation

## 2023-07-11 DIAGNOSIS — Z8673 Personal history of transient ischemic attack (TIA), and cerebral infarction without residual deficits: Secondary | ICD-10-CM | POA: Insufficient documentation

## 2023-07-11 DIAGNOSIS — J441 Chronic obstructive pulmonary disease with (acute) exacerbation: Secondary | ICD-10-CM | POA: Diagnosis not present

## 2023-07-11 DIAGNOSIS — Z20822 Contact with and (suspected) exposure to covid-19: Secondary | ICD-10-CM | POA: Insufficient documentation

## 2023-07-11 DIAGNOSIS — I2510 Atherosclerotic heart disease of native coronary artery without angina pectoris: Secondary | ICD-10-CM | POA: Insufficient documentation

## 2023-07-11 DIAGNOSIS — R000 Tachycardia, unspecified: Secondary | ICD-10-CM | POA: Diagnosis not present

## 2023-07-11 DIAGNOSIS — R0789 Other chest pain: Secondary | ICD-10-CM | POA: Diagnosis not present

## 2023-07-11 DIAGNOSIS — R61 Generalized hyperhidrosis: Secondary | ICD-10-CM | POA: Diagnosis not present

## 2023-07-11 LAB — TROPONIN I (HIGH SENSITIVITY)
Troponin I (High Sensitivity): 4 ng/L (ref <18)
Troponin I (High Sensitivity): 5 ng/L (ref <18)

## 2023-07-11 LAB — BASIC METABOLIC PANEL
Anion gap: 13 (ref 5–15)
BUN: 10 mg/dL (ref 8–23)
CO2: 22 mmol/L (ref 22–32)
Calcium: 8.5 mg/dL — ABNORMAL LOW (ref 8.9–10.3)
Chloride: 104 mmol/L (ref 98–111)
Creatinine, Ser: 0.77 mg/dL (ref 0.61–1.24)
GFR, Estimated: >60 mL/min (ref >60)
Glucose, Bld: 91 mg/dL (ref 70–99)
Potassium: 3.8 mmol/L (ref 3.5–5.1)
Sodium: 139 mmol/L (ref 135–145)

## 2023-07-11 LAB — RESP PANEL BY RT-PCR (RSV, FLU A&B, COVID)  RVPGX2
Influenza A by PCR: NEGATIVE
Influenza B by PCR: NEGATIVE
Resp Syncytial Virus by PCR: NEGATIVE
SARS Coronavirus 2 by RT PCR: NEGATIVE

## 2023-07-11 LAB — CBC
HCT: 32.9 % — ABNORMAL LOW (ref 39.0–52.0)
Hemoglobin: 10.8 g/dL — ABNORMAL LOW (ref 13.0–17.0)
MCH: 29.3 pg (ref 26.0–34.0)
MCHC: 32.8 g/dL (ref 30.0–36.0)
MCV: 89.4 fL (ref 80.0–100.0)
Platelets: 271 10*3/uL (ref 150–400)
RBC: 3.68 MIL/uL — ABNORMAL LOW (ref 4.22–5.81)
RDW: 23.7 % — ABNORMAL HIGH (ref 11.5–15.5)
WBC: 4.8 10*3/uL (ref 4.0–10.5)
nRBC: 0 % (ref 0.0–0.2)

## 2023-07-11 MED ORDER — IPRATROPIUM-ALBUTEROL 0.5-2.5 (3) MG/3ML IN SOLN
9.0000 mL | Freq: Once | RESPIRATORY_TRACT | Status: AC
  Administered 2023-07-11: 9 mL via RESPIRATORY_TRACT
  Filled 2023-07-11: qty 9

## 2023-07-11 MED ORDER — PREDNISONE 10 MG PO TABS
20.0000 mg | ORAL_TABLET | Freq: Every day | ORAL | 0 refills | Status: AC
Start: 2023-07-11 — End: 2023-07-16

## 2023-07-11 MED ORDER — METHYLPREDNISOLONE SODIUM SUCC 125 MG IJ SOLR
125.0000 mg | Freq: Once | INTRAMUSCULAR | Status: AC
  Administered 2023-07-11: 125 mg via INTRAVENOUS
  Filled 2023-07-11: qty 2

## 2023-07-11 NOTE — ED Triage Notes (Signed)
 Pt here via [facility] with cp that started at 0900 this morning. Pt states pain starts in his lower back and radiates up to his chest, crushing in nature. Pt took a Goody 845 of aspirin before ems arrival.   198/100 135/100 96 97% RA

## 2023-07-11 NOTE — ED Provider Notes (Signed)
 [facility] Provider Note    Event Date/Time   First MD Initiated Contact with Patient 07/11/23 1646     (approximate)   History   Chest Pain   HPI  [name] is a 68 y.o. male past medical history significant for hypertension, COPD, prior stroke, CAD who presents to the emergency department with chest pain and shortness of breath.  States he has been having intermittent episodes of chest pain.  Earlier today had an episode of chest pain that he described as a chest tightness associated with shortness of breath.  Denies any nausea, vomiting.  Denies any cough.  Since arriving to the emergency department states that his chest pain has improved.  No active chest pain at this time.  Endorses some mild shortness of breath.  Some lower back pain which he states that he has had in the past.  Denies any numbness or weakness to his legs.  No urinary or bowel incontinence.  Recent nuclear medicine stress test that was normal.  States that he has a follow-up appointment coming up with his cardiologist.  Denies history of DVT or PE.  Denies recent hospitalization, surgery or travel.  No pleuritic chest pain.      Physical Exam   Triage Vital Signs: ED Triage Vitals  Encounter Vitals Group     BP 07/11/23 1109 (!) 161/103     Systolic BP Percentile --      Diastolic BP Percentile --      Pulse Rate 07/11/23 1109 90     Resp 07/11/23 1109 20     Temp 07/11/23 1109 98.8 F (37.1 C)     Temp Source 07/11/23 1453 Oral     SpO2 07/11/23 1109 95 %     Weight 07/11/23 1109 227 lb 1.2 oz (103 kg)     Height 07/11/23 1109 5' 11 (1.803 m)     Head Circumference --      Peak Flow --      Pain Score 07/11/23 1109 3     Pain Loc --      Pain Education --      Exclude from Growth Chart --     Most recent vital signs: Vitals:   07/11/23 1453 07/11/23 1858  BP: (!) 178/97 (!) 171/91  Pulse: 98 81  Resp: 19 18  Temp: 98.9 F (37.2 C) 99.1 F (37.3 C)   SpO2: 96% 98%    Physical Exam Constitutional:      Appearance: He is well-developed.  HENT:     Head: Atraumatic.  Eyes:     Conjunctiva/sclera: Conjunctivae normal.  Cardiovascular:     Rate and Rhythm: Regular rhythm.     Heart sounds: Normal heart sounds.  Pulmonary:     Effort: No respiratory distress.     Breath sounds: Wheezing present.  Musculoskeletal:     Cervical back: Normal range of motion.     Right lower leg: No edema.     Left lower leg: No edema.     Comments: No unilateral leg swelling.  Intact and equal DP pulses.  Skin:    General: Skin is warm.     Capillary Refill: Capillary refill takes less than 2 seconds.  Neurological:     Mental Status: He is alert. Mental status is at baseline.     IMPRESSION / MDM / ASSESSMENT AND PLAN / ED COURSE  I reviewed the triage vital signs and the nursing notes.  Differential  diagnosis including but not limited to musculoskeletal, gastritis/PUD, COPD exacerbation, pneumonia, ACS.  Low risk Wells criteria.  Low suspicion for pulmonary embolism, no pleuritic chest pain, no findings of DVT.  Does have some mild wheezing on exam.  No tearing chest pain and no active chest pain at this time, symmetric pulses, lower suspicion for dissection.  EKG  I, Clotilda Punter, the attending physician, personally viewed and interpreted this ECG.  Normal sinus rhythm.  Underlying right bundle branch block.  P waves present.  No significant ST elevation or depression.  No significant change when compared to prior EKG.  No tachycardic or bradycardic dysrhythmias while on cardiac telemetry.  RADIOLOGY I independently reviewed imaging, my interpretation of imaging: Chest x-ray no signs of pneumonia.  No widened mediastinum.  LABS (all labs ordered are listed, but only abnormal results are displayed) Labs interpreted as -    Labs Reviewed  BASIC METABOLIC PANEL - Abnormal; Notable for the following components:      Result Value    Calcium  8.5 (*)    All other components within normal limits  CBC - Abnormal; Notable for the following components:   RBC 3.68 (*)    Hemoglobin 10.8 (*)    HCT 32.9 (*)    RDW 23.7 (*)    All other components within normal limits  RESP PANEL BY RT-PCR (RSV, FLU A&B, COVID)  RVPGX2  TROPONIN I (HIGH SENSITIVITY)  TROPONIN I (HIGH SENSITIVITY)     MDM    On reevaluation patient states that he is feeling better after DuoNeb treatment and Solu-Medrol .  No increased work of breathing.  Continues to deny any chest pain.  Serial troponins are negative, chest pain-free, on chart review patient had a nuclear medicine stress test done 7/24 -discussed admission to the hospital for possible cardiac catheterization/close follow-up and discussion about cardiac catheterization with his radiologist.  States that he would call and talk to his cardiologist about his chest pain symptoms.  Lab work overall reassuring.  No significant leukocytosis.  Mild anemia but no signs or symptoms of a GI bleed.  No upper abdominal pain.  Denies any melena.  Reevaluation continues to be chest pain-free.  Discussed at length close follow-up with his pulmonologist, primary care doctor and cardiologist.  Discussed return to the emergency department for any return or worsening of symptoms.   PROCEDURES:  Critical Care performed: No  Procedures  Patient's presentation is most consistent with acute presentation with potential threat to life or bodily function.   MEDICATIONS ORDERED IN ED: Medications  ipratropium-albuterol  (DUONEB) 0.5-2.5 (3) MG/3ML nebulizer solution 9 mL (9 mLs Nebulization Given 07/11/23 1832)  methylPREDNISolone  sodium succinate (SOLU-MEDROL ) 125 mg/2 mL injection 125 mg (125 mg Intravenous Given 07/11/23 1834)    FINAL CLINICAL IMPRESSION(S) / ED DIAGNOSES   Final diagnoses:  Chest pain, unspecified type  COPD exacerbation (HCC)     Rx / DC Orders   ED Discharge Orders           Ordered    predniSONE  (DELTASONE ) 10 MG tablet  Daily with breakfast        07/11/23 1930             Note:  This document was prepared using Dragon voice recognition software and may include unintentional dictation errors.   Punter Clotilda, MD 07/12/23 GARLON

## 2023-07-11 NOTE — Discharge Instructions (Addendum)
 You are seen in the emergency department for an episode of chest pain.  You had 2 heart enzymes that were normal.  You had a chest x-ray done that did not show any signs of pneumonia.  On review of your outside records you had a nuclear medicine stress test done in September of this past year that was normal.  Your chest pain resolved while in the emergency department.  You did have some mild wheezing on exam and you were given an albuterol  treatment and your first dose of steroids.  Call your cardiologist to schedule close follow-up appointment and discuss whether you would need cardiac catheterization.  Return to the emergency department for any return of chest pain, shortness of breath or worsening symptoms.  Call and follow-up closely with your primary care physician.  Your COVID and influenza testing were negative.  Prednisone  -you are given a prescription for a steroid.  It is important that you take this medication with food.  This medication can cause an upset stomach.  It also can increase your glucose if you have a history of diabetes, so it is important that you check your glucose frequently while you are on this medication.  Thank you for choosing us  for your health care, it was my pleasure to care for you today!  Clotilda Punter, MD

## 2023-10-15 DIAGNOSIS — H2512 Age-related nuclear cataract, left eye: Secondary | ICD-10-CM | POA: Diagnosis not present

## 2023-10-15 DIAGNOSIS — H2511 Age-related nuclear cataract, right eye: Secondary | ICD-10-CM | POA: Diagnosis not present

## 2023-10-15 DIAGNOSIS — Z0100 Encounter for examination of eyes and vision without abnormal findings: Secondary | ICD-10-CM | POA: Diagnosis not present

## 2023-10-15 DIAGNOSIS — H2513 Age-related nuclear cataract, bilateral: Secondary | ICD-10-CM | POA: Diagnosis not present

## 2023-10-15 DIAGNOSIS — H401134 Primary open-angle glaucoma, bilateral, indeterminate stage: Secondary | ICD-10-CM | POA: Diagnosis not present

## 2023-11-10 DIAGNOSIS — J449 Chronic obstructive pulmonary disease, unspecified: Secondary | ICD-10-CM | POA: Diagnosis not present

## 2023-11-10 DIAGNOSIS — J309 Allergic rhinitis, unspecified: Secondary | ICD-10-CM | POA: Diagnosis not present

## 2023-12-23 ENCOUNTER — Other Ambulatory Visit: Payer: Self-pay

## 2023-12-23 ENCOUNTER — Emergency Department

## 2023-12-23 ENCOUNTER — Emergency Department
Admission: EM | Admit: 2023-12-23 | Discharge: 2023-12-23 | Disposition: A | Discharge status: Home / Self Care | Attending: Emergency Medicine | Admitting: Emergency Medicine

## 2023-12-23 DIAGNOSIS — I10 Essential (primary) hypertension: Secondary | ICD-10-CM | POA: Diagnosis not present

## 2023-12-23 DIAGNOSIS — J449 Chronic obstructive pulmonary disease, unspecified: Secondary | ICD-10-CM | POA: Diagnosis not present

## 2023-12-23 DIAGNOSIS — R079 Chest pain, unspecified: Secondary | ICD-10-CM | POA: Diagnosis not present

## 2023-12-23 DIAGNOSIS — J439 Emphysema, unspecified: Secondary | ICD-10-CM | POA: Diagnosis not present

## 2023-12-23 DIAGNOSIS — R932 Abnormal findings on diagnostic imaging of liver and biliary tract: Secondary | ICD-10-CM | POA: Diagnosis not present

## 2023-12-23 DIAGNOSIS — R0789 Other chest pain: Secondary | ICD-10-CM | POA: Diagnosis not present

## 2023-12-23 DIAGNOSIS — K5730 Diverticulosis of large intestine without perforation or abscess without bleeding: Secondary | ICD-10-CM | POA: Diagnosis not present

## 2023-12-23 DIAGNOSIS — K7689 Other specified diseases of liver: Secondary | ICD-10-CM | POA: Diagnosis not present

## 2023-12-23 LAB — CBC
HCT: 27.2 % — ABNORMAL LOW (ref 39.0–52.0)
Hemoglobin: 8.9 g/dL — ABNORMAL LOW (ref 13.0–17.0)
MCH: 29.7 pg (ref 26.0–34.0)
MCHC: 32.7 g/dL (ref 30.0–36.0)
MCV: 90.7 fL (ref 80.0–100.0)
Platelets: 209 10*3/uL (ref 150–400)
RBC: 3 MIL/uL — ABNORMAL LOW (ref 4.22–5.81)
RDW: 29.2 % — ABNORMAL HIGH (ref 11.5–15.5)
WBC: 4.5 10*3/uL (ref 4.0–10.5)
nRBC: 0 % (ref 0.0–0.2)

## 2023-12-23 LAB — TROPONIN I (HIGH SENSITIVITY)
Troponin I (High Sensitivity): 6 ng/L (ref <18)
Troponin I (High Sensitivity): 6 ng/L (ref <18)

## 2023-12-23 LAB — BASIC METABOLIC PANEL WITH GFR
Anion gap: 11 (ref 5–15)
BUN: 6 mg/dL — ABNORMAL LOW (ref 8–23)
CO2: 25 mmol/L (ref 22–32)
Calcium: 8.3 mg/dL — ABNORMAL LOW (ref 8.9–10.3)
Chloride: 105 mmol/L (ref 98–111)
Creatinine, Ser: 0.67 mg/dL (ref 0.61–1.24)
GFR, Estimated: >60 mL/min (ref >60)
Glucose, Bld: 94 mg/dL (ref 70–99)
Potassium: 3.6 mmol/L (ref 3.5–5.1)
Sodium: 141 mmol/L (ref 135–145)

## 2023-12-23 LAB — LIPASE, BLOOD: Lipase: 26 U/L (ref 11–51)

## 2023-12-23 MED ORDER — FENTANYL CITRATE PF 50 MCG/ML IJ SOSY
100.0000 ug | PREFILLED_SYRINGE | Freq: Once | INTRAMUSCULAR | Status: AC
  Administered 2023-12-23: 100 ug via INTRAVENOUS
  Filled 2023-12-23: qty 2

## 2023-12-23 MED ORDER — IOHEXOL 350 MG/ML SOLN
100.0000 mL | Freq: Once | INTRAVENOUS | Status: AC | PRN
  Administered 2023-12-23: 100 mL via INTRATHECAL

## 2023-12-23 MED ORDER — LIDOCAINE VISCOUS HCL 2 % MT SOLN
15.0000 mL | Freq: Once | OROMUCOSAL | Status: AC
  Administered 2023-12-23: 15 mL via ORAL
  Filled 2023-12-23: qty 15

## 2023-12-23 MED ORDER — SUCRALFATE 1 G PO TABS: 1.0000 g | ORAL_TABLET | Freq: Four times a day (QID) | ORAL | 0 refills | Status: AC

## 2023-12-23 MED ORDER — ALUM & MAG HYDROXIDE-SIMETH 200-200-20 MG/5ML PO SUSP
30.0000 mL | Freq: Once | ORAL | Status: AC
  Administered 2023-12-23: 30 mL via ORAL
  Filled 2023-12-23: qty 30

## 2023-12-23 NOTE — ED Notes (Signed)
 Tried to stick x2. Lab called to stick Pt

## 2023-12-23 NOTE — ED Triage Notes (Signed)
 Pt comes with severe cp mid sternal. Pt states pain in lower back also. Pt states sob. Pt went to take dog out and came back in laid down and his chest started to hurt. Pt appears in a lot of discomfort.

## 2023-12-23 NOTE — Discharge Instructions (Signed)
 Please call the number provided for GI medicine to arrange a follow-up appointment.  As we discussed please continue to take your omeprazole  as prescribed.  Please use sucralfate before breakfast, lunch, dinner and bed for the next 2 weeks.  Return to the emergency department for any recurrence of your chest pain or any other symptom concerning to yourself.  Please also follow-up with your cardiologist by calling today to arrange a follow-up appointment soon as possible.

## 2023-12-23 NOTE — ED Provider Notes (Signed)
 [facility] Provider Note    Event Date/Time   First MD Initiated Contact with Patient 12/23/23 1015     (approximate)  History   Chief Complaint: Chest Pain  HPI  Matas Cornelis Kluver is a 68 y.o. male with a past medical history of gastric reflux, COPD, hypertension, CVA neurodeficits, presents to the emergency department for chest pain.  According to the patient approximately 1 hour prior to arrival he developed sudden sharp pain in the center of his chest radiating to his back.  Patient states a chronic mild to moderate pain and every few minutes patient will experience a sudden severe sharp pain.  Patient states he has had a similar pain in the past has had this worked up here as well as [facility] with no findings that he is aware of.  Patient denies any abdominal pain.  Denies any shortness of breath.  Physical Exam   Triage Vital Signs: ED Triage Vitals  Encounter Vitals Group     BP 12/23/23 0955 (!) 158/104     Girls Systolic BP Percentile --      Girls Diastolic BP Percentile --      Boys Systolic BP Percentile --      Boys Diastolic BP Percentile --      Pulse Rate 12/23/23 0955 98     Resp 12/23/23 0955 18     Temp 12/23/23 0955 98.7 F (37.1 C)     Temp src --      SpO2 12/23/23 0955 99 %     Weight 12/23/23 0953 196 lb (88.9 kg)     Height 12/23/23 0953 5' 10 (1.778 m)     Head Circumference --      Peak Flow --      Pain Score 12/23/23 0953 0     Pain Loc --      Pain Education --      Exclude from Growth Chart --     Most recent vital signs: Vitals:   12/23/23 0955  BP: (!) 158/104  Pulse: 98  Resp: 18  Temp: 98.7 F (37.1 C)  SpO2: 99%    General: Awake, no distress for most of the examination however every few minutes he will clench his chest and appear uncomfortable with a more sudden sharp pain.  This dissipates after a minute or 2. CV:  Good peripheral perfusion.  Regular rate and rhythm  Resp:  Normal effort.  Equal  breath sounds bilaterally.  Abd:  No distention.  Soft, nontender.  No rebound or guarding.  ED Results / Procedures / Treatments   EKG  EKG viewed and interpreted by myself shows a normal sinus rhythm at 100 bpm with a widened QRS, normal axis, largely normal intervals with nonspecific ST changes.  RADIOLOGY  I have reviewed and interpreted CTA images I do not appreciate any obvious dissection or aneurysm of the aorta. Radiology has read the CT scan as negative for acute abnormality of the aorta.  Patient has no urinary symptoms.   MEDICATIONS ORDERED IN ED: Medications  fentaNYL  (SUBLIMAZE ) injection 100 mcg (has no administration in time range)     IMPRESSION / MDM / ASSESSMENT AND PLAN / ED COURSE  I reviewed the triage vital signs and the nursing notes.  Patient's presentation is most consistent with acute presentation with potential threat to life or bodily function.  Patient presents to the emergency department for sudden onset chest pain described as sharp and severe radiating to  his back.  Patient's presentation is concerning for aortic injury.  Will proceed with an emergent CTA of the chest.  No history of renal disease we will not wait for labs to return.  Nurse was unable to obtain an IV I placed an ultrasound-guided 20-gauge right AC IV.  Will dose pain medication check labs including CBC chemistry and troponin and proceed with emergent CTA of the chest/abdomen/pelvis dissection protocol.  CT scan is negative for acute abnormality.  Lab work is reassuring with a baseline CBC with anemia which appears to be chronic but waxes and wanes in severity.  Chemistry shows no concerning finding.  Troponin negative x 2.  Lipase is normal.  Patient received fentanyl  with some pain relief received a GI cocktail with good pain relief denies any pain at all currently.  Suspect this could be esophageal in nature possibly esophageal spasms or esophagitis.  Patient takes omeprazole  daily  already.  We will add on sucralfate 4 times daily for 2 weeks.  We will refer to GI medicine for further evaluation.  Patient remains pain-free in the emergency department since receiving the GI cocktail.  FINAL CLINICAL IMPRESSION(S) / ED DIAGNOSES   Chest pain   Note:  This document was prepared using Dragon voice recognition software and may include unintentional dictation errors.   Ruth Cove, MD 12/23/23 1413

## 2024-02-16 DIAGNOSIS — H401134 Primary open-angle glaucoma, bilateral, indeterminate stage: Secondary | ICD-10-CM | POA: Diagnosis not present

## 2024-03-11 ENCOUNTER — Other Ambulatory Visit: Payer: Self-pay

## 2024-03-11 ENCOUNTER — Emergency Department

## 2024-03-11 ENCOUNTER — Emergency Department
Admission: EM | Admit: 2024-03-11 | Discharge: 2024-03-11 | Disposition: A | Discharge status: Home / Self Care | Source: Ambulatory Visit | Attending: Emergency Medicine | Admitting: Emergency Medicine

## 2024-03-11 DIAGNOSIS — I6523 Occlusion and stenosis of bilateral carotid arteries: Secondary | ICD-10-CM | POA: Diagnosis not present

## 2024-03-11 DIAGNOSIS — I10 Essential (primary) hypertension: Secondary | ICD-10-CM | POA: Insufficient documentation

## 2024-03-11 DIAGNOSIS — I4510 Unspecified right bundle-branch block: Secondary | ICD-10-CM | POA: Diagnosis not present

## 2024-03-11 DIAGNOSIS — R0602 Shortness of breath: Secondary | ICD-10-CM | POA: Diagnosis not present

## 2024-03-11 DIAGNOSIS — R079 Chest pain, unspecified: Secondary | ICD-10-CM | POA: Diagnosis not present

## 2024-03-11 DIAGNOSIS — J449 Chronic obstructive pulmonary disease, unspecified: Secondary | ICD-10-CM | POA: Insufficient documentation

## 2024-03-11 DIAGNOSIS — I700 Atherosclerosis of aorta: Secondary | ICD-10-CM | POA: Diagnosis not present

## 2024-03-11 DIAGNOSIS — M62830 Muscle spasm of back: Secondary | ICD-10-CM | POA: Diagnosis not present

## 2024-03-11 DIAGNOSIS — R519 Headache, unspecified: Secondary | ICD-10-CM | POA: Insufficient documentation

## 2024-03-11 LAB — COMPREHENSIVE METABOLIC PANEL WITH GFR
ALT: 15 U/L (ref 0–44)
AST: 22 U/L (ref 15–41)
Albumin: 4.4 g/dL (ref 3.5–5.0)
Alkaline Phosphatase: 44 U/L (ref 38–126)
Anion gap: 10 (ref 5–15)
BUN: 13 mg/dL (ref 8–23)
CO2: 26 mmol/L (ref 22–32)
Calcium: 8 mg/dL — ABNORMAL LOW (ref 8.9–10.3)
Chloride: 104 mmol/L (ref 98–111)
Creatinine, Ser: 0.7 mg/dL (ref 0.61–1.24)
GFR, Estimated: >60 mL/min (ref >60)
Glucose, Bld: 89 mg/dL (ref 70–99)
Potassium: 3.8 mmol/L (ref 3.5–5.1)
Sodium: 140 mmol/L (ref 135–145)
Total Bilirubin: 1.1 mg/dL (ref 0.0–1.2)
Total Protein: 7 g/dL (ref 6.5–8.1)

## 2024-03-11 LAB — CBC
HCT: 29.6 % — ABNORMAL LOW (ref 39.0–52.0)
Hemoglobin: 9.7 g/dL — ABNORMAL LOW (ref 13.0–17.0)
MCH: 29.8 pg (ref 26.0–34.0)
MCHC: 32.8 g/dL (ref 30.0–36.0)
MCV: 90.8 fL (ref 80.0–100.0)
Platelets: 211 K/uL (ref 150–400)
RBC: 3.26 MIL/uL — ABNORMAL LOW (ref 4.22–5.81)
RDW: 28.6 % — ABNORMAL HIGH (ref 11.5–15.5)
WBC: 5.9 K/uL (ref 4.0–10.5)
nRBC: 0 % (ref 0.0–0.2)

## 2024-03-11 LAB — TROPONIN I (HIGH SENSITIVITY): Troponin I (High Sensitivity): 5 ng/L (ref <18)

## 2024-03-11 LAB — LIPASE, BLOOD: Lipase: 26 U/L (ref 11–51)

## 2024-03-11 MED ORDER — IOHEXOL 350 MG/ML SOLN
100.0000 mL | Freq: Once | INTRAVENOUS | Status: AC | PRN
  Administered 2024-03-11: 100 mL via INTRAVENOUS

## 2024-03-11 MED ORDER — METHOCARBAMOL 500 MG PO TABS: 500.0000 mg | ORAL_TABLET | Freq: Three times a day (TID) | ORAL | 0 refills | Status: AC | PRN

## 2024-03-11 MED ORDER — METHOCARBAMOL 500 MG PO TABS
500.0000 mg | ORAL_TABLET | Freq: Once | ORAL | Status: AC
  Administered 2024-03-11: 500 mg via ORAL
  Filled 2024-03-11: qty 1

## 2024-03-11 MED ORDER — ACETAMINOPHEN 325 MG PO TABS
650.0000 mg | ORAL_TABLET | Freq: Once | ORAL | Status: AC
  Administered 2024-03-11: 650 mg via ORAL
  Filled 2024-03-11: qty 2

## 2024-03-11 MED ORDER — AMLODIPINE BESYLATE 5 MG PO TABS
5.0000 mg | ORAL_TABLET | Freq: Once | ORAL | Status: AC
  Administered 2024-03-11: 5 mg via ORAL
  Filled 2024-03-11: qty 1

## 2024-03-11 NOTE — ED Provider Notes (Signed)
 [facility] Provider Note    Event Date/Time   First MD Initiated Contact with Patient 03/11/24 1559     (approximate)   History   Spasms   HPI  [name] is a 68 y.o. male  with a past medical history of CVA, TIA, hypertension, DDD, alcohol abuse, COPD, old vertebral compression fracture of T11 and L1 presents to the emergency department with lower right back spasm that started this morning while laying down.  Patient states he had a headache that felt like it was shooting down to his spine when the spasms started as well as some intermittent chest pain and shortness of breath that has now resolved. Denies fever or chills, abdominal pain, nausea, vomiting, cough. No bowel or bladder incontinence, dysuria, arm or leg numbness or weakness. No hx of DVT or PE. No recent hospitalization. Patient reports similar spasmodic pains in the past. He has not tried anything for his pain at home. No hx of migraines.   Physical Exam   Triage Vital Signs: ED Triage Vitals [03/11/24 1408]  Encounter Vitals Group     BP (!) 164/95     Girls Systolic BP Percentile      Girls Diastolic BP Percentile      Boys Systolic BP Percentile      Boys Diastolic BP Percentile      Pulse Rate 85     Resp 20     Temp 97.8 F (36.6 C)     Temp Source Oral     SpO2 98 %     Weight      Height      Head Circumference      Peak Flow      Pain Score      Pain Loc      Pain Education      Exclude from Growth Chart     Most recent vital signs: Vitals:   03/11/24 1930 03/11/24 1945  BP: (!) 175/91 (!) 176/100  Pulse: 81 81  Resp:    Temp:    SpO2: 100% 96%    General: Awake, in no acute distress. Appears stated age. Head: Normocephalic, atraumatic. Eyes: PERRLA. EOMs intact. No scleral icterus or conjunctival injection. Neck: Supple, no lymphadenopathy, no nuchal rigidity. CV: Good peripheral perfusion. Regular rate.  Respiratory:Normal respiratory effort.   No respiratory distress. Equal breath sounds b/l. GI: Soft, non-distended, non-tender.  MSK: Grossly normal ROM and  5/5 strength in b/l upper and lower extremities. No midline lumbar tenderness.  Skin:Warm, dry, intact. No rashes. Neurological: A&Ox4 to person, place, time, and situation. Cranial nerves III-XII grossly intact. Sensation intact and equal to b/l upper and lower extremities. No focal deficits.  No CVA tenderness b/l.  ED Results / Procedures / Treatments   Labs (all labs ordered are listed, but only abnormal results are displayed) Labs Reviewed  CBC - Abnormal; Notable for the following components:      Result Value   RBC 3.26 (*)    Hemoglobin 9.7 (*)    HCT 29.6 (*)    RDW 28.6 (*)    All other components within normal limits  COMPREHENSIVE METABOLIC PANEL WITH GFR - Abnormal; Notable for the following components:   Calcium  8.0 (*)    All other components within normal limits  LIPASE, BLOOD  TROPONIN I (HIGH SENSITIVITY)     EKG   Rate: 79 bpm Rhythm: NSR, RBBB consistent with old ECG findings from 12/23/2023 Axis: Positive  PR Interval: 172 ms QRS Complex: 142 ms QT interval: 424 ms   RADIOLOGY CT head and CT angio chest aorta (dissection protocol) ordered   PROCEDURES:  Critical Care performed: No   Procedures   MEDICATIONS ORDERED IN ED: Medications  amLODipine  (NORVASC ) tablet 5 mg (has no administration in time range)  methocarbamol  (ROBAXIN ) tablet 500 mg (500 mg Oral Given 03/11/24 1643)  iohexol  (OMNIPAQUE ) 350 MG/ML injection 100 mL (100 mLs Intravenous Contrast Given 03/11/24 1756)  acetaminophen  (TYLENOL ) tablet 650 mg (650 mg Oral Given 03/11/24 1844)     IMPRESSION / MDM / ASSESSMENT AND PLAN / ED COURSE  I reviewed the triage vital signs and the nursing notes.                              Differential diagnosis includes, but is not limited to, muscle spasm, MSK injury, hypertension urgency vs emergency, aortic aneurysm, aortic  dissection  Patient's presentation is most consistent with acute complicated illness / injury requiring diagnostic workup.   4:15 p.m.: Labs and imaging ordered. Nontoxic appearing on exam.  6:30 p.m. Lipase, troponin reassuring. EKG w/ NSR, RBBB, comparable to old EKG from 12/23/2023. CMP w/ hypocalcemia 8.0. CBC w/ Hgb 9.7, HCT 29.6, normal WBC count of 5.9. CXR with no acute abnormality. CT angio chest, aorta with no acute finding including no aortic aneurysm or dissection. Given methocarbamol  with little to no improvement. Re-evaluation shows patient now with sudden, severe, one-sided headache after going for CT scan. No hx of migraines, but does have Hx of CVA and TIA. Will give tylenol  and re-evaluate once CT head back.  7:30 p.m. Re-eval shows patient is feeling better. No longer having headaches or back spasms, dizziness, vision changes, chest pain, SOB, abdominal pain. CT head with no acute intracranial abnormality. Will send methocarbamol  to his pharmacy, discussed precautions regarding taking this medication.  Also gave him a dose of his amlodipine  since he states he takes 5 mg twice daily instead of the once daily listed in his chart. He should follow-up with his primary care provider outpatient.  The patient may return to the emergency department for any new, worsening, or concerning symptoms. Patient was given the opportunity to ask questions; all questions were answered. Emergency department return precautions were discussed with the patient.  Patient is in agreement to the treatment plan.  Patient is stable for discharge.    FINAL CLINICAL IMPRESSION(S) / ED DIAGNOSES   Final diagnoses:  Back spasm  Primary hypertension     Rx / DC Orders   ED Discharge Orders          Ordered    methocarbamol  (ROBAXIN ) 500 MG tablet  Every 8 hours PRN        03/11/24 1940             Note:  This document was prepared using Dragon voice recognition software and may include  unintentional dictation errors.     Sheron Salm, [name] 03/11/24 1959    Waymond Lorelle Cummins, MD 03/15/24 0700

## 2024-03-11 NOTE — ED Notes (Signed)
 Patient to CT via wheelchair.

## 2024-03-11 NOTE — ED Notes (Signed)
 ED Provider at bedside.

## 2024-03-11 NOTE — Discharge Instructions (Signed)
 You have been seen in the Emergency Department (ED) today for back spasms.  Your workup today did not reveal any findings that require you to stay in the hospital.   Please take Tylenol  as needed for pain, but only as written on the box.  You were prescribed Methocarbamol  (muscle relaxer) to help with your pain.  Please take this medication only as prescribed. Please do not work, make legal-binding decisions, drink alcohol, get up on ladders or heights, or operate a motor vehicle or machinery while taking the Methocarbamol .   Please follow up with your primary care doctor as soon as possible regarding today's ED visit.  Call your doctor or return to the Emergency Department (ED)  if you develop a sudden or severe headache, confusion, slurred speech, facial droop, weakness or numbness in any arm or leg,  extreme fatigue, vomiting more than two times, severe abdominal pain, or any other symptoms that concern you.

## 2024-03-11 NOTE — ED Triage Notes (Signed)
 Patient to ED via EMS for lower back spasms; history of the same but this episode started this morning.

## 2024-03-15 DIAGNOSIS — K219 Gastro-esophageal reflux disease without esophagitis: Secondary | ICD-10-CM | POA: Diagnosis not present

## 2024-03-15 DIAGNOSIS — Z13220 Encounter for screening for lipoid disorders: Secondary | ICD-10-CM | POA: Diagnosis not present

## 2024-03-15 DIAGNOSIS — J309 Allergic rhinitis, unspecified: Secondary | ICD-10-CM | POA: Diagnosis not present

## 2024-03-15 DIAGNOSIS — I10 Essential (primary) hypertension: Secondary | ICD-10-CM | POA: Diagnosis not present

## 2024-03-15 DIAGNOSIS — Z125 Encounter for screening for malignant neoplasm of prostate: Secondary | ICD-10-CM | POA: Diagnosis not present

## 2024-03-15 DIAGNOSIS — Z0000 Encounter for general adult medical examination without abnormal findings: Secondary | ICD-10-CM | POA: Diagnosis not present

## 2024-03-15 DIAGNOSIS — Z1331 Encounter for screening for depression: Secondary | ICD-10-CM | POA: Diagnosis not present

## 2024-03-15 DIAGNOSIS — Z87891 Personal history of nicotine dependence: Secondary | ICD-10-CM | POA: Diagnosis not present

## 2024-03-15 DIAGNOSIS — J449 Chronic obstructive pulmonary disease, unspecified: Secondary | ICD-10-CM | POA: Diagnosis not present

## 2024-03-18 DIAGNOSIS — D649 Anemia, unspecified: Secondary | ICD-10-CM | POA: Diagnosis not present

## 2024-03-18 DIAGNOSIS — R7989 Other specified abnormal findings of blood chemistry: Secondary | ICD-10-CM | POA: Diagnosis not present

## 2024-03-18 DIAGNOSIS — Z125 Encounter for screening for malignant neoplasm of prostate: Secondary | ICD-10-CM | POA: Diagnosis not present

## 2024-03-18 DIAGNOSIS — I10 Essential (primary) hypertension: Secondary | ICD-10-CM | POA: Diagnosis not present

## 2024-03-18 DIAGNOSIS — K219 Gastro-esophageal reflux disease without esophagitis: Secondary | ICD-10-CM | POA: Diagnosis not present

## 2024-03-18 DIAGNOSIS — E538 Deficiency of other specified B group vitamins: Secondary | ICD-10-CM | POA: Diagnosis not present

## 2024-03-18 DIAGNOSIS — Z13220 Encounter for screening for lipoid disorders: Secondary | ICD-10-CM | POA: Diagnosis not present

## 2024-04-07 DIAGNOSIS — I10 Essential (primary) hypertension: Secondary | ICD-10-CM | POA: Diagnosis not present

## 2024-04-07 DIAGNOSIS — G459 Transient cerebral ischemic attack, unspecified: Secondary | ICD-10-CM | POA: Diagnosis not present

## 2024-04-07 DIAGNOSIS — F1010 Alcohol abuse, uncomplicated: Secondary | ICD-10-CM | POA: Diagnosis not present

## 2024-04-07 DIAGNOSIS — I2089 Other forms of angina pectoris: Secondary | ICD-10-CM | POA: Diagnosis not present

## 2024-04-07 DIAGNOSIS — Z720 Tobacco use: Secondary | ICD-10-CM | POA: Diagnosis not present

## 2024-04-07 DIAGNOSIS — K219 Gastro-esophageal reflux disease without esophagitis: Secondary | ICD-10-CM | POA: Diagnosis not present
